# Patient Record
Sex: Female | Born: 1955 | Race: Black or African American | Hispanic: No | Smoking: Never smoker
Health system: Southern US, Community
[De-identification: ages and names within clinical notes are randomized; demographics above are authoritative.]

## PROBLEM LIST (undated history)

## (undated) DIAGNOSIS — I10 Essential (primary) hypertension: Secondary | ICD-10-CM

## (undated) DIAGNOSIS — R079 Chest pain, unspecified: Secondary | ICD-10-CM

## (undated) DIAGNOSIS — E669 Obesity, unspecified: Secondary | ICD-10-CM

## (undated) DIAGNOSIS — K219 Gastro-esophageal reflux disease without esophagitis: Secondary | ICD-10-CM

## (undated) DIAGNOSIS — E785 Hyperlipidemia, unspecified: Secondary | ICD-10-CM

## (undated) DIAGNOSIS — M1990 Unspecified osteoarthritis, unspecified site: Secondary | ICD-10-CM

## (undated) DIAGNOSIS — G4730 Sleep apnea, unspecified: Secondary | ICD-10-CM

## (undated) DIAGNOSIS — G8929 Other chronic pain: Secondary | ICD-10-CM

## (undated) DIAGNOSIS — M25519 Pain in unspecified shoulder: Secondary | ICD-10-CM

## (undated) HISTORY — PX: PARTIAL HYSTERECTOMY: SHX80

## (undated) HISTORY — DX: Obesity, unspecified: E66.9

## (undated) HISTORY — DX: Hyperlipidemia, unspecified: E78.5

---

## 1997-12-13 ENCOUNTER — Emergency Department (HOSPITAL_COMMUNITY): Admission: EM | Admit: 1997-12-13 | Discharge: 1997-12-13 | Payer: Self-pay | Admitting: Emergency Medicine

## 1997-12-20 ENCOUNTER — Emergency Department (HOSPITAL_COMMUNITY): Admission: EM | Admit: 1997-12-20 | Discharge: 1997-12-20 | Payer: Self-pay | Admitting: Emergency Medicine

## 1997-12-29 ENCOUNTER — Emergency Department (HOSPITAL_COMMUNITY): Admission: EM | Admit: 1997-12-29 | Discharge: 1997-12-29 | Payer: Self-pay | Admitting: Emergency Medicine

## 1998-01-21 ENCOUNTER — Emergency Department (HOSPITAL_COMMUNITY): Admission: EM | Admit: 1998-01-21 | Discharge: 1998-01-21 | Payer: Self-pay | Admitting: Emergency Medicine

## 1998-09-10 ENCOUNTER — Emergency Department (HOSPITAL_COMMUNITY): Admission: EM | Admit: 1998-09-10 | Discharge: 1998-09-10 | Payer: Self-pay | Admitting: Emergency Medicine

## 1998-10-20 ENCOUNTER — Encounter: Payer: Self-pay | Admitting: Emergency Medicine

## 1998-10-20 ENCOUNTER — Emergency Department (HOSPITAL_COMMUNITY): Admission: EM | Admit: 1998-10-20 | Discharge: 1998-10-20 | Payer: Self-pay | Admitting: Emergency Medicine

## 1998-10-30 ENCOUNTER — Emergency Department (HOSPITAL_COMMUNITY): Admission: EM | Admit: 1998-10-30 | Discharge: 1998-10-30 | Payer: Self-pay | Admitting: Emergency Medicine

## 1999-05-02 ENCOUNTER — Inpatient Hospital Stay (HOSPITAL_COMMUNITY): Admission: EM | Admit: 1999-05-02 | Discharge: 1999-05-04 | Payer: Self-pay | Admitting: Emergency Medicine

## 1999-05-02 ENCOUNTER — Encounter: Payer: Self-pay | Admitting: Emergency Medicine

## 1999-05-02 ENCOUNTER — Encounter: Payer: Self-pay | Admitting: Internal Medicine

## 1999-05-03 ENCOUNTER — Encounter: Payer: Self-pay | Admitting: Internal Medicine

## 1999-05-21 ENCOUNTER — Encounter: Payer: Self-pay | Admitting: Emergency Medicine

## 1999-05-21 ENCOUNTER — Emergency Department (HOSPITAL_COMMUNITY): Admission: EM | Admit: 1999-05-21 | Discharge: 1999-05-21 | Payer: Self-pay | Admitting: Emergency Medicine

## 1999-06-05 ENCOUNTER — Encounter: Payer: Self-pay | Admitting: Emergency Medicine

## 1999-06-05 ENCOUNTER — Observation Stay (HOSPITAL_COMMUNITY): Admission: EM | Admit: 1999-06-05 | Discharge: 1999-06-06 | Payer: Self-pay | Admitting: Emergency Medicine

## 1999-06-11 ENCOUNTER — Encounter: Admission: RE | Admit: 1999-06-11 | Discharge: 1999-06-11 | Payer: Self-pay | Admitting: Family Medicine

## 1999-06-15 ENCOUNTER — Encounter: Payer: Self-pay | Admitting: Emergency Medicine

## 1999-06-15 ENCOUNTER — Emergency Department (HOSPITAL_COMMUNITY): Admission: EM | Admit: 1999-06-15 | Discharge: 1999-06-15 | Payer: Self-pay | Admitting: Emergency Medicine

## 1999-06-18 ENCOUNTER — Encounter: Payer: Self-pay | Admitting: Emergency Medicine

## 1999-06-18 ENCOUNTER — Emergency Department (HOSPITAL_COMMUNITY): Admission: EM | Admit: 1999-06-18 | Discharge: 1999-06-18 | Payer: Self-pay | Admitting: Emergency Medicine

## 1999-06-25 ENCOUNTER — Encounter: Admission: RE | Admit: 1999-06-25 | Discharge: 1999-06-25 | Payer: Self-pay | Admitting: Family Medicine

## 1999-07-23 ENCOUNTER — Emergency Department (HOSPITAL_COMMUNITY): Admission: EM | Admit: 1999-07-23 | Discharge: 1999-07-23 | Payer: Self-pay | Admitting: *Deleted

## 1999-07-23 ENCOUNTER — Encounter: Payer: Self-pay | Admitting: Emergency Medicine

## 1999-07-28 ENCOUNTER — Emergency Department (HOSPITAL_COMMUNITY): Admission: EM | Admit: 1999-07-28 | Discharge: 1999-07-28 | Payer: Self-pay | Admitting: Emergency Medicine

## 1999-07-28 ENCOUNTER — Encounter: Payer: Self-pay | Admitting: Emergency Medicine

## 1999-10-01 ENCOUNTER — Emergency Department (HOSPITAL_COMMUNITY): Admission: EM | Admit: 1999-10-01 | Discharge: 1999-10-01 | Payer: Self-pay | Admitting: Emergency Medicine

## 1999-10-02 ENCOUNTER — Encounter: Admission: RE | Admit: 1999-10-02 | Discharge: 1999-10-02 | Payer: Self-pay

## 1999-11-03 ENCOUNTER — Encounter: Payer: Self-pay | Admitting: Emergency Medicine

## 1999-11-03 ENCOUNTER — Emergency Department (HOSPITAL_COMMUNITY): Admission: EM | Admit: 1999-11-03 | Discharge: 1999-11-03 | Payer: Self-pay | Admitting: Emergency Medicine

## 1999-11-21 ENCOUNTER — Emergency Department (HOSPITAL_COMMUNITY): Admission: EM | Admit: 1999-11-21 | Discharge: 1999-11-21 | Payer: Self-pay | Admitting: Emergency Medicine

## 1999-12-02 ENCOUNTER — Emergency Department (HOSPITAL_COMMUNITY): Admission: EM | Admit: 1999-12-02 | Discharge: 1999-12-03 | Payer: Self-pay

## 1999-12-17 ENCOUNTER — Ambulatory Visit (HOSPITAL_COMMUNITY): Admission: RE | Admit: 1999-12-17 | Discharge: 1999-12-17 | Payer: Self-pay | Admitting: *Deleted

## 1999-12-29 ENCOUNTER — Encounter: Payer: Self-pay | Admitting: Emergency Medicine

## 1999-12-29 ENCOUNTER — Emergency Department (HOSPITAL_COMMUNITY): Admission: EM | Admit: 1999-12-29 | Discharge: 1999-12-29 | Payer: Self-pay | Admitting: Emergency Medicine

## 2000-01-09 ENCOUNTER — Emergency Department (HOSPITAL_COMMUNITY): Admission: EM | Admit: 2000-01-09 | Discharge: 2000-01-09 | Payer: Self-pay | Admitting: Emergency Medicine

## 2000-01-10 ENCOUNTER — Encounter: Payer: Self-pay | Admitting: Emergency Medicine

## 2000-02-04 ENCOUNTER — Emergency Department (HOSPITAL_COMMUNITY): Admission: EM | Admit: 2000-02-04 | Discharge: 2000-02-04 | Payer: Self-pay | Admitting: Emergency Medicine

## 2000-02-04 ENCOUNTER — Encounter: Payer: Self-pay | Admitting: Emergency Medicine

## 2000-02-10 ENCOUNTER — Emergency Department (HOSPITAL_COMMUNITY): Admission: EM | Admit: 2000-02-10 | Discharge: 2000-02-10 | Payer: Self-pay | Admitting: Emergency Medicine

## 2000-03-30 ENCOUNTER — Emergency Department (HOSPITAL_COMMUNITY): Admission: EM | Admit: 2000-03-30 | Discharge: 2000-03-31 | Payer: Self-pay | Admitting: Emergency Medicine

## 2000-03-31 ENCOUNTER — Encounter: Payer: Self-pay | Admitting: Emergency Medicine

## 2000-04-15 ENCOUNTER — Inpatient Hospital Stay (HOSPITAL_COMMUNITY): Admission: AD | Admit: 2000-04-15 | Discharge: 2000-04-15 | Payer: Self-pay | Admitting: Obstetrics

## 2000-05-11 ENCOUNTER — Inpatient Hospital Stay (HOSPITAL_COMMUNITY): Admission: AD | Admit: 2000-05-11 | Discharge: 2000-05-11 | Payer: Self-pay | Admitting: Obstetrics

## 2000-06-29 ENCOUNTER — Emergency Department (HOSPITAL_COMMUNITY): Admission: EM | Admit: 2000-06-29 | Discharge: 2000-06-29 | Payer: Self-pay | Admitting: Emergency Medicine

## 2000-07-13 ENCOUNTER — Emergency Department (HOSPITAL_COMMUNITY): Admission: EM | Admit: 2000-07-13 | Discharge: 2000-07-13 | Payer: Self-pay | Admitting: Emergency Medicine

## 2000-09-05 ENCOUNTER — Emergency Department (HOSPITAL_COMMUNITY): Admission: EM | Admit: 2000-09-05 | Discharge: 2000-09-05 | Payer: Self-pay | Admitting: Emergency Medicine

## 2000-09-05 ENCOUNTER — Encounter: Payer: Self-pay | Admitting: Emergency Medicine

## 2000-10-10 ENCOUNTER — Encounter: Payer: Self-pay | Admitting: Emergency Medicine

## 2000-10-10 ENCOUNTER — Emergency Department (HOSPITAL_COMMUNITY): Admission: EM | Admit: 2000-10-10 | Discharge: 2000-10-10 | Payer: Self-pay | Admitting: Emergency Medicine

## 2000-12-13 ENCOUNTER — Emergency Department (HOSPITAL_COMMUNITY): Admission: EM | Admit: 2000-12-13 | Discharge: 2000-12-13 | Payer: Self-pay | Admitting: Emergency Medicine

## 2000-12-13 ENCOUNTER — Encounter: Payer: Self-pay | Admitting: Emergency Medicine

## 2001-02-06 ENCOUNTER — Emergency Department (HOSPITAL_COMMUNITY): Admission: EM | Admit: 2001-02-06 | Discharge: 2001-02-07 | Payer: Self-pay | Admitting: Emergency Medicine

## 2001-03-18 ENCOUNTER — Inpatient Hospital Stay (HOSPITAL_COMMUNITY): Admission: AD | Admit: 2001-03-18 | Discharge: 2001-03-18 | Payer: Self-pay | Admitting: Obstetrics & Gynecology

## 2001-03-18 ENCOUNTER — Encounter: Payer: Self-pay | Admitting: Obstetrics & Gynecology

## 2001-04-24 ENCOUNTER — Inpatient Hospital Stay (HOSPITAL_COMMUNITY): Admission: AD | Admit: 2001-04-24 | Discharge: 2001-04-24 | Payer: Self-pay | Admitting: Obstetrics

## 2001-05-26 ENCOUNTER — Emergency Department (HOSPITAL_COMMUNITY): Admission: EM | Admit: 2001-05-26 | Discharge: 2001-05-26 | Payer: Self-pay | Admitting: Emergency Medicine

## 2001-05-26 ENCOUNTER — Encounter: Payer: Self-pay | Admitting: Emergency Medicine

## 2001-08-22 ENCOUNTER — Emergency Department (HOSPITAL_COMMUNITY): Admission: EM | Admit: 2001-08-22 | Discharge: 2001-08-22 | Payer: Self-pay | Admitting: Emergency Medicine

## 2001-10-09 ENCOUNTER — Emergency Department (HOSPITAL_COMMUNITY): Admission: EM | Admit: 2001-10-09 | Discharge: 2001-10-09 | Payer: Self-pay

## 2001-10-25 ENCOUNTER — Encounter: Payer: Self-pay | Admitting: Emergency Medicine

## 2001-10-25 ENCOUNTER — Emergency Department (HOSPITAL_COMMUNITY): Admission: EM | Admit: 2001-10-25 | Discharge: 2001-10-25 | Payer: Self-pay | Admitting: Emergency Medicine

## 2001-11-27 ENCOUNTER — Emergency Department (HOSPITAL_COMMUNITY): Admission: EM | Admit: 2001-11-27 | Discharge: 2001-11-27 | Payer: Self-pay | Admitting: Emergency Medicine

## 2002-02-01 ENCOUNTER — Encounter: Payer: Self-pay | Admitting: Emergency Medicine

## 2002-02-01 ENCOUNTER — Emergency Department (HOSPITAL_COMMUNITY): Admission: EM | Admit: 2002-02-01 | Discharge: 2002-02-02 | Payer: Self-pay

## 2002-04-14 ENCOUNTER — Emergency Department (HOSPITAL_COMMUNITY): Admission: EM | Admit: 2002-04-14 | Discharge: 2002-04-14 | Payer: Self-pay | Admitting: *Deleted

## 2002-04-17 ENCOUNTER — Emergency Department (HOSPITAL_COMMUNITY): Admission: EM | Admit: 2002-04-17 | Discharge: 2002-04-17 | Payer: Self-pay

## 2002-05-05 ENCOUNTER — Emergency Department (HOSPITAL_COMMUNITY): Admission: EM | Admit: 2002-05-05 | Discharge: 2002-05-05 | Payer: Self-pay | Admitting: Emergency Medicine

## 2002-05-29 ENCOUNTER — Emergency Department (HOSPITAL_COMMUNITY): Admission: EM | Admit: 2002-05-29 | Discharge: 2002-05-29 | Payer: Self-pay | Admitting: Emergency Medicine

## 2002-05-31 ENCOUNTER — Emergency Department (HOSPITAL_COMMUNITY): Admission: EM | Admit: 2002-05-31 | Discharge: 2002-05-31 | Payer: Self-pay | Admitting: Emergency Medicine

## 2002-05-31 ENCOUNTER — Encounter: Payer: Self-pay | Admitting: Emergency Medicine

## 2002-06-25 ENCOUNTER — Emergency Department (HOSPITAL_COMMUNITY): Admission: EM | Admit: 2002-06-25 | Discharge: 2002-06-25 | Payer: Self-pay | Admitting: Emergency Medicine

## 2002-06-25 ENCOUNTER — Encounter: Payer: Self-pay | Admitting: Emergency Medicine

## 2002-07-17 ENCOUNTER — Emergency Department (HOSPITAL_COMMUNITY): Admission: EM | Admit: 2002-07-17 | Discharge: 2002-07-17 | Payer: Self-pay | Admitting: Emergency Medicine

## 2002-10-27 ENCOUNTER — Encounter: Payer: Self-pay | Admitting: Emergency Medicine

## 2002-10-27 ENCOUNTER — Emergency Department (HOSPITAL_COMMUNITY): Admission: EM | Admit: 2002-10-27 | Discharge: 2002-10-27 | Payer: Self-pay | Admitting: Emergency Medicine

## 2003-01-19 ENCOUNTER — Emergency Department (HOSPITAL_COMMUNITY): Admission: EM | Admit: 2003-01-19 | Discharge: 2003-01-19 | Payer: Self-pay | Admitting: *Deleted

## 2003-01-19 ENCOUNTER — Emergency Department (HOSPITAL_COMMUNITY): Admission: EM | Admit: 2003-01-19 | Discharge: 2003-01-19 | Payer: Self-pay

## 2003-01-19 ENCOUNTER — Encounter: Payer: Self-pay | Admitting: *Deleted

## 2003-03-12 ENCOUNTER — Encounter: Payer: Self-pay | Admitting: Emergency Medicine

## 2003-03-12 ENCOUNTER — Emergency Department (HOSPITAL_COMMUNITY): Admission: EM | Admit: 2003-03-12 | Discharge: 2003-03-12 | Payer: Self-pay | Admitting: Emergency Medicine

## 2003-03-21 ENCOUNTER — Encounter: Payer: Self-pay | Admitting: *Deleted

## 2003-03-21 ENCOUNTER — Inpatient Hospital Stay (HOSPITAL_COMMUNITY): Admission: AD | Admit: 2003-03-21 | Discharge: 2003-03-21 | Payer: Self-pay | Admitting: *Deleted

## 2003-04-05 ENCOUNTER — Encounter: Payer: Self-pay | Admitting: Obstetrics

## 2003-04-05 ENCOUNTER — Ambulatory Visit (HOSPITAL_COMMUNITY): Admission: RE | Admit: 2003-04-05 | Discharge: 2003-04-05 | Payer: Self-pay | Admitting: Obstetrics

## 2003-04-17 ENCOUNTER — Emergency Department (HOSPITAL_COMMUNITY): Admission: EM | Admit: 2003-04-17 | Discharge: 2003-04-18 | Payer: Self-pay | Admitting: Emergency Medicine

## 2003-04-17 ENCOUNTER — Encounter: Payer: Self-pay | Admitting: Emergency Medicine

## 2003-05-03 ENCOUNTER — Encounter (INDEPENDENT_AMBULATORY_CARE_PROVIDER_SITE_OTHER): Payer: Self-pay

## 2003-05-03 ENCOUNTER — Inpatient Hospital Stay (HOSPITAL_COMMUNITY): Admission: RE | Admit: 2003-05-03 | Discharge: 2003-05-06 | Payer: Self-pay | Admitting: Obstetrics

## 2003-06-21 ENCOUNTER — Inpatient Hospital Stay (HOSPITAL_COMMUNITY): Admission: AD | Admit: 2003-06-21 | Discharge: 2003-06-21 | Payer: Self-pay | Admitting: Obstetrics

## 2003-07-17 ENCOUNTER — Emergency Department (HOSPITAL_COMMUNITY): Admission: EM | Admit: 2003-07-17 | Discharge: 2003-07-18 | Payer: Self-pay | Admitting: Emergency Medicine

## 2003-07-31 ENCOUNTER — Inpatient Hospital Stay (HOSPITAL_COMMUNITY): Admission: AD | Admit: 2003-07-31 | Discharge: 2003-07-31 | Payer: Self-pay | Admitting: Obstetrics & Gynecology

## 2003-10-15 ENCOUNTER — Emergency Department (HOSPITAL_COMMUNITY): Admission: EM | Admit: 2003-10-15 | Discharge: 2003-10-16 | Payer: Self-pay | Admitting: Emergency Medicine

## 2003-11-03 ENCOUNTER — Emergency Department (HOSPITAL_COMMUNITY): Admission: EM | Admit: 2003-11-03 | Discharge: 2003-11-04 | Payer: Self-pay | Admitting: Emergency Medicine

## 2004-02-13 ENCOUNTER — Emergency Department (HOSPITAL_COMMUNITY): Admission: EM | Admit: 2004-02-13 | Discharge: 2004-02-14 | Payer: Self-pay | Admitting: Emergency Medicine

## 2004-05-13 ENCOUNTER — Emergency Department (HOSPITAL_COMMUNITY): Admission: EM | Admit: 2004-05-13 | Discharge: 2004-05-13 | Payer: Self-pay | Admitting: Emergency Medicine

## 2004-08-01 ENCOUNTER — Emergency Department (HOSPITAL_COMMUNITY): Admission: EM | Admit: 2004-08-01 | Discharge: 2004-08-01 | Payer: Self-pay | Admitting: Emergency Medicine

## 2004-11-12 ENCOUNTER — Emergency Department (HOSPITAL_COMMUNITY): Admission: EM | Admit: 2004-11-12 | Discharge: 2004-11-13 | Payer: Self-pay | Admitting: *Deleted

## 2004-11-23 ENCOUNTER — Inpatient Hospital Stay (HOSPITAL_COMMUNITY): Admission: AD | Admit: 2004-11-23 | Discharge: 2004-11-23 | Payer: Self-pay | Admitting: Obstetrics

## 2005-05-15 ENCOUNTER — Emergency Department (HOSPITAL_COMMUNITY): Admission: EM | Admit: 2005-05-15 | Discharge: 2005-05-15 | Payer: Self-pay | Admitting: *Deleted

## 2005-07-25 ENCOUNTER — Emergency Department (HOSPITAL_COMMUNITY): Admission: EM | Admit: 2005-07-25 | Discharge: 2005-07-25 | Payer: Self-pay | Admitting: Emergency Medicine

## 2005-08-22 ENCOUNTER — Ambulatory Visit: Payer: Self-pay | Admitting: Internal Medicine

## 2005-08-22 ENCOUNTER — Inpatient Hospital Stay (HOSPITAL_COMMUNITY): Admission: EM | Admit: 2005-08-22 | Discharge: 2005-08-26 | Payer: Self-pay | Admitting: Emergency Medicine

## 2005-08-25 ENCOUNTER — Encounter: Payer: Self-pay | Admitting: Cardiology

## 2005-12-30 ENCOUNTER — Inpatient Hospital Stay (HOSPITAL_COMMUNITY): Admission: AD | Admit: 2005-12-30 | Discharge: 2005-12-30 | Payer: Self-pay | Admitting: Obstetrics and Gynecology

## 2006-05-16 ENCOUNTER — Emergency Department (HOSPITAL_COMMUNITY): Admission: EM | Admit: 2006-05-16 | Discharge: 2006-05-16 | Payer: Self-pay | Admitting: Emergency Medicine

## 2006-07-29 ENCOUNTER — Emergency Department (HOSPITAL_COMMUNITY): Admission: EM | Admit: 2006-07-29 | Discharge: 2006-07-29 | Payer: Self-pay | Admitting: Emergency Medicine

## 2006-08-23 ENCOUNTER — Emergency Department (HOSPITAL_COMMUNITY): Admission: EM | Admit: 2006-08-23 | Discharge: 2006-08-23 | Payer: Self-pay | Admitting: Emergency Medicine

## 2007-01-16 ENCOUNTER — Ambulatory Visit: Payer: Self-pay | Admitting: Internal Medicine

## 2007-01-16 ENCOUNTER — Ambulatory Visit: Payer: Self-pay | Admitting: Cardiology

## 2007-01-16 ENCOUNTER — Inpatient Hospital Stay (HOSPITAL_COMMUNITY): Admission: EM | Admit: 2007-01-16 | Discharge: 2007-01-18 | Payer: Self-pay | Admitting: Emergency Medicine

## 2007-01-17 ENCOUNTER — Encounter (INDEPENDENT_AMBULATORY_CARE_PROVIDER_SITE_OTHER): Payer: Self-pay | Admitting: Internal Medicine

## 2007-03-02 ENCOUNTER — Inpatient Hospital Stay (HOSPITAL_COMMUNITY): Admission: AD | Admit: 2007-03-02 | Discharge: 2007-03-02 | Payer: Self-pay | Admitting: Family Medicine

## 2007-05-28 ENCOUNTER — Emergency Department (HOSPITAL_COMMUNITY): Admission: EM | Admit: 2007-05-28 | Discharge: 2007-05-29 | Payer: Self-pay | Admitting: Emergency Medicine

## 2007-05-31 ENCOUNTER — Emergency Department (HOSPITAL_COMMUNITY): Admission: EM | Admit: 2007-05-31 | Discharge: 2007-05-31 | Payer: Self-pay | Admitting: Emergency Medicine

## 2007-07-01 ENCOUNTER — Ambulatory Visit: Payer: Self-pay | Admitting: Internal Medicine

## 2007-07-01 DIAGNOSIS — M549 Dorsalgia, unspecified: Secondary | ICD-10-CM | POA: Insufficient documentation

## 2007-07-01 DIAGNOSIS — K219 Gastro-esophageal reflux disease without esophagitis: Secondary | ICD-10-CM

## 2007-07-01 DIAGNOSIS — E785 Hyperlipidemia, unspecified: Secondary | ICD-10-CM

## 2007-09-03 ENCOUNTER — Encounter (INDEPENDENT_AMBULATORY_CARE_PROVIDER_SITE_OTHER): Payer: Self-pay | Admitting: Emergency Medicine

## 2007-09-03 ENCOUNTER — Ambulatory Visit: Payer: Self-pay | Admitting: Surgery

## 2007-09-03 ENCOUNTER — Ambulatory Visit: Admission: RE | Admit: 2007-09-03 | Discharge: 2007-09-03 | Payer: Self-pay | Admitting: Emergency Medicine

## 2007-10-05 ENCOUNTER — Emergency Department (HOSPITAL_COMMUNITY): Admission: EM | Admit: 2007-10-05 | Discharge: 2007-10-05 | Payer: Self-pay | Admitting: Emergency Medicine

## 2007-10-11 ENCOUNTER — Emergency Department (HOSPITAL_COMMUNITY): Admission: EM | Admit: 2007-10-11 | Discharge: 2007-10-11 | Payer: Self-pay | Admitting: Emergency Medicine

## 2008-02-28 ENCOUNTER — Emergency Department (HOSPITAL_COMMUNITY): Admission: EM | Admit: 2008-02-28 | Discharge: 2008-02-28 | Payer: Self-pay | Admitting: Emergency Medicine

## 2008-04-26 ENCOUNTER — Emergency Department (HOSPITAL_COMMUNITY): Admission: EM | Admit: 2008-04-26 | Discharge: 2008-04-27 | Payer: Self-pay | Admitting: Emergency Medicine

## 2008-06-19 ENCOUNTER — Emergency Department (HOSPITAL_COMMUNITY): Admission: EM | Admit: 2008-06-19 | Discharge: 2008-06-19 | Payer: Self-pay | Admitting: Emergency Medicine

## 2008-08-18 ENCOUNTER — Emergency Department (HOSPITAL_COMMUNITY): Admission: EM | Admit: 2008-08-18 | Discharge: 2008-08-18 | Payer: Self-pay | Admitting: Emergency Medicine

## 2008-08-20 ENCOUNTER — Ambulatory Visit (HOSPITAL_COMMUNITY): Admission: RE | Admit: 2008-08-20 | Discharge: 2008-08-20 | Payer: Self-pay | Admitting: Emergency Medicine

## 2008-11-29 ENCOUNTER — Emergency Department (HOSPITAL_COMMUNITY): Admission: EM | Admit: 2008-11-29 | Discharge: 2008-11-29 | Payer: Self-pay | Admitting: Emergency Medicine

## 2009-02-03 ENCOUNTER — Emergency Department (HOSPITAL_COMMUNITY): Admission: EM | Admit: 2009-02-03 | Discharge: 2009-02-03 | Payer: Self-pay | Admitting: Emergency Medicine

## 2009-02-13 ENCOUNTER — Encounter: Admission: RE | Admit: 2009-02-13 | Discharge: 2009-02-13 | Payer: Self-pay | Admitting: Orthopedic Surgery

## 2009-02-21 ENCOUNTER — Encounter: Admission: RE | Admit: 2009-02-21 | Discharge: 2009-02-21 | Payer: Self-pay | Admitting: Orthopedic Surgery

## 2009-03-08 ENCOUNTER — Encounter: Admission: RE | Admit: 2009-03-08 | Discharge: 2009-03-08 | Payer: Self-pay | Admitting: Orthopedic Surgery

## 2009-05-30 ENCOUNTER — Emergency Department (HOSPITAL_COMMUNITY): Admission: EM | Admit: 2009-05-30 | Discharge: 2009-05-30 | Payer: Self-pay | Admitting: Emergency Medicine

## 2009-06-13 ENCOUNTER — Emergency Department (HOSPITAL_COMMUNITY): Admission: EM | Admit: 2009-06-13 | Discharge: 2009-06-14 | Payer: Self-pay | Admitting: Emergency Medicine

## 2009-06-14 ENCOUNTER — Encounter: Payer: Self-pay | Admitting: Cardiology

## 2009-11-04 ENCOUNTER — Emergency Department (HOSPITAL_COMMUNITY): Admission: EM | Admit: 2009-11-04 | Discharge: 2009-11-04 | Payer: Self-pay | Admitting: Emergency Medicine

## 2010-02-03 ENCOUNTER — Emergency Department (HOSPITAL_COMMUNITY): Admission: EM | Admit: 2010-02-03 | Discharge: 2010-02-03 | Payer: Self-pay | Admitting: Emergency Medicine

## 2010-02-12 ENCOUNTER — Observation Stay (HOSPITAL_COMMUNITY): Admission: EM | Admit: 2010-02-12 | Discharge: 2010-02-13 | Payer: Self-pay | Admitting: Emergency Medicine

## 2010-02-12 ENCOUNTER — Ambulatory Visit: Payer: Self-pay | Admitting: Cardiology

## 2010-05-01 ENCOUNTER — Emergency Department (HOSPITAL_COMMUNITY): Admission: EM | Admit: 2010-05-01 | Discharge: 2010-05-01 | Payer: Self-pay | Admitting: Emergency Medicine

## 2010-06-22 ENCOUNTER — Emergency Department (HOSPITAL_COMMUNITY)
Admission: EM | Admit: 2010-06-22 | Discharge: 2010-06-22 | Payer: Self-pay | Source: Home / Self Care | Admitting: Emergency Medicine

## 2010-06-30 ENCOUNTER — Emergency Department (HOSPITAL_COMMUNITY)
Admission: EM | Admit: 2010-06-30 | Discharge: 2010-06-30 | Payer: Self-pay | Source: Home / Self Care | Admitting: Emergency Medicine

## 2010-07-13 ENCOUNTER — Emergency Department (HOSPITAL_COMMUNITY)
Admission: EM | Admit: 2010-07-13 | Discharge: 2010-07-13 | Payer: Self-pay | Source: Home / Self Care | Admitting: Emergency Medicine

## 2010-08-27 ENCOUNTER — Emergency Department (HOSPITAL_COMMUNITY)
Admission: EM | Admit: 2010-08-27 | Discharge: 2010-08-27 | Disposition: A | Discharge status: Home / Self Care | Payer: Self-pay | Attending: Emergency Medicine | Admitting: Emergency Medicine

## 2010-08-27 DIAGNOSIS — G8929 Other chronic pain: Secondary | ICD-10-CM | POA: Insufficient documentation

## 2010-08-27 DIAGNOSIS — M549 Dorsalgia, unspecified: Secondary | ICD-10-CM | POA: Insufficient documentation

## 2010-09-06 LAB — POCT CARDIAC MARKERS
CKMB, poc: <1 ng/mL — ABNORMAL LOW (ref 1.0–8.0)
Myoglobin, poc: 39 ng/mL (ref 12–200)
Myoglobin, poc: 40.4 ng/mL (ref 12–200)
Myoglobin, poc: 47.5 ng/mL (ref 12–200)
Myoglobin, poc: 47.7 ng/mL (ref 12–200)
Troponin i, poc: <0.05 ng/mL (ref 0.00–0.09)
Troponin i, poc: <0.05 ng/mL (ref 0.00–0.09)

## 2010-09-06 LAB — BASIC METABOLIC PANEL
GFR calc Af Amer: >60 mL/min (ref >60)
GFR calc non Af Amer: >60 mL/min (ref >60)
Potassium: 3.8 mEq/L (ref 3.5–5.1)
Sodium: 131 mEq/L — ABNORMAL LOW (ref 135–145)

## 2010-09-06 LAB — CBC
HCT: 41.6 % (ref 36.0–46.0)
Hemoglobin: 13.7 g/dL (ref 12.0–15.0)
WBC: 12.7 10*3/uL — ABNORMAL HIGH (ref 4.0–10.5)

## 2010-09-06 LAB — DIFFERENTIAL
Basophils Absolute: 0 10*3/uL (ref 0.0–0.1)
Lymphocytes Relative: 33 % (ref 12–46)
Monocytes Absolute: 0.8 10*3/uL (ref 0.1–1.0)
Monocytes Relative: 7 % (ref 3–12)
Neutro Abs: 7.4 10*3/uL (ref 1.7–7.7)

## 2010-09-09 LAB — POCT I-STAT, CHEM 8
HCT: 43 % (ref 36.0–46.0)
Hemoglobin: 14.6 g/dL (ref 12.0–15.0)
Potassium: 3.9 mEq/L (ref 3.5–5.1)
Sodium: 142 mEq/L (ref 135–145)
TCO2: 26 mmol/L (ref 0–100)

## 2010-09-09 LAB — POCT CARDIAC MARKERS
CKMB, poc: <1 ng/mL — ABNORMAL LOW (ref 1.0–8.0)
Myoglobin, poc: 46.8 ng/mL (ref 12–200)

## 2010-09-09 LAB — CBC
MCV: 83.5 fL (ref 78.0–100.0)
Platelets: 269 10*3/uL (ref 150–400)
RDW: 15.9 % — ABNORMAL HIGH (ref 11.5–15.5)
WBC: 8.9 10*3/uL (ref 4.0–10.5)

## 2010-09-09 LAB — DIFFERENTIAL
Basophils Absolute: 0 10*3/uL (ref 0.0–0.1)
Basophils Relative: 0 % (ref 0–1)
Eosinophils Absolute: 0.2 10*3/uL (ref 0.0–0.7)
Eosinophils Relative: 2 % (ref 0–5)
Lymphs Abs: 3.8 10*3/uL (ref 0.7–4.0)
Neutrophils Relative %: 49 % (ref 43–77)

## 2010-09-16 ENCOUNTER — Emergency Department (HOSPITAL_COMMUNITY): Payer: Self-pay

## 2010-09-16 ENCOUNTER — Emergency Department (HOSPITAL_COMMUNITY)
Admission: EM | Admit: 2010-09-16 | Discharge: 2010-09-16 | Disposition: A | Discharge status: Home / Self Care | Payer: Self-pay | Attending: Emergency Medicine | Admitting: Emergency Medicine

## 2010-09-16 DIAGNOSIS — R1013 Epigastric pain: Secondary | ICD-10-CM | POA: Insufficient documentation

## 2010-09-16 DIAGNOSIS — R0602 Shortness of breath: Secondary | ICD-10-CM | POA: Insufficient documentation

## 2010-09-16 DIAGNOSIS — R109 Unspecified abdominal pain: Secondary | ICD-10-CM | POA: Insufficient documentation

## 2010-09-16 DIAGNOSIS — IMO0002 Reserved for concepts with insufficient information to code with codable children: Secondary | ICD-10-CM | POA: Insufficient documentation

## 2010-09-16 DIAGNOSIS — M546 Pain in thoracic spine: Secondary | ICD-10-CM | POA: Insufficient documentation

## 2010-09-16 DIAGNOSIS — M549 Dorsalgia, unspecified: Secondary | ICD-10-CM | POA: Insufficient documentation

## 2010-09-16 LAB — COMPREHENSIVE METABOLIC PANEL
ALT: 18 U/L (ref 0–35)
AST: 19 U/L (ref 0–37)
CO2: 28 mEq/L (ref 19–32)
Calcium: 9.8 mg/dL (ref 8.4–10.5)
Chloride: 104 mEq/L (ref 96–112)
Creatinine, Ser: 0.61 mg/dL (ref 0.4–1.2)
GFR calc Af Amer: >60 mL/min (ref >60)
GFR calc non Af Amer: >60 mL/min (ref >60)
Glucose, Bld: 120 mg/dL — ABNORMAL HIGH (ref 70–99)
Total Bilirubin: 0.5 mg/dL (ref 0.3–1.2)

## 2010-09-16 LAB — URINALYSIS, ROUTINE W REFLEX MICROSCOPIC
Bilirubin Urine: NEGATIVE
Glucose, UA: NEGATIVE mg/dL
Hgb urine dipstick: NEGATIVE
Protein, ur: NEGATIVE mg/dL
Urobilinogen, UA: 0.2 mg/dL (ref 0.0–1.0)

## 2010-09-23 LAB — POCT CARDIAC MARKERS: Myoglobin, poc: 72 ng/mL (ref 12–200)

## 2010-09-23 LAB — TROPONIN I
Troponin I: 0.02 ng/mL (ref 0.00–0.06)
Troponin I: 0.02 ng/mL (ref 0.00–0.06)

## 2010-09-23 LAB — CK TOTAL AND CKMB (NOT AT ARMC)
CK, MB: 0.7 ng/mL (ref 0.3–4.0)
CK, MB: 0.7 ng/mL (ref 0.3–4.0)
Total CK: 73 U/L (ref 7–177)

## 2010-09-23 LAB — DIFFERENTIAL
Lymphocytes Relative: 32 % (ref 12–46)
Lymphs Abs: 3.2 10*3/uL (ref 0.7–4.0)
Monocytes Relative: 5 % (ref 3–12)
Neutrophils Relative %: 62 % (ref 43–77)

## 2010-09-23 LAB — URINALYSIS, ROUTINE W REFLEX MICROSCOPIC
Bilirubin Urine: NEGATIVE
Glucose, UA: NEGATIVE mg/dL
Hgb urine dipstick: NEGATIVE
Ketones, ur: NEGATIVE mg/dL
Protein, ur: NEGATIVE mg/dL
pH: 6 (ref 5.0–8.0)

## 2010-09-23 LAB — CBC
Platelets: 287 10*3/uL (ref 150–400)
RBC: 4.95 MIL/uL (ref 3.87–5.11)
WBC: 10.1 10*3/uL (ref 4.0–10.5)

## 2010-09-23 LAB — PROTIME-INR
INR: 0.98 (ref 0.00–1.49)
Prothrombin Time: 12.9 seconds (ref 11.6–15.2)

## 2010-09-23 LAB — BASIC METABOLIC PANEL
BUN: 10 mg/dL (ref 6–23)
Chloride: 101 mEq/L (ref 96–112)
Creatinine, Ser: 0.62 mg/dL (ref 0.4–1.2)
GFR calc Af Amer: >60 mL/min (ref >60)
GFR calc non Af Amer: >60 mL/min (ref >60)
Potassium: 3.9 mEq/L (ref 3.5–5.1)

## 2010-09-23 LAB — BRAIN NATRIURETIC PEPTIDE: Pro B Natriuretic peptide (BNP): <30 pg/mL (ref 0.0–100.0)

## 2010-09-23 LAB — APTT: aPTT: 30 seconds (ref 24–37)

## 2010-09-28 LAB — URINALYSIS, ROUTINE W REFLEX MICROSCOPIC
Bilirubin Urine: NEGATIVE
Hgb urine dipstick: NEGATIVE
Ketones, ur: NEGATIVE mg/dL
Nitrite: NEGATIVE
Protein, ur: NEGATIVE mg/dL
Urobilinogen, UA: 0.2 mg/dL (ref 0.0–1.0)

## 2010-09-30 LAB — COMPREHENSIVE METABOLIC PANEL
ALT: 33 U/L (ref 0–35)
BUN: 8 mg/dL (ref 6–23)
CO2: 27 mEq/L (ref 19–32)
Calcium: 9.6 mg/dL (ref 8.4–10.5)
Creatinine, Ser: 0.61 mg/dL (ref 0.4–1.2)
GFR calc non Af Amer: >60 mL/min (ref >60)
Glucose, Bld: 88 mg/dL (ref 70–99)
Sodium: 139 mEq/L (ref 135–145)

## 2010-09-30 LAB — URINALYSIS, ROUTINE W REFLEX MICROSCOPIC
Bilirubin Urine: NEGATIVE
Hgb urine dipstick: NEGATIVE
Protein, ur: NEGATIVE mg/dL
Urobilinogen, UA: 1 mg/dL (ref 0.0–1.0)

## 2010-09-30 LAB — CK TOTAL AND CKMB (NOT AT ARMC): Total CK: 113 U/L (ref 7–177)

## 2010-09-30 LAB — D-DIMER, QUANTITATIVE: D-Dimer, Quant: 0.31 ug/mL-FEU (ref 0.00–0.48)

## 2010-09-30 LAB — CBC
MCHC: 33.5 g/dL (ref 30.0–36.0)
MCV: 84.5 fL (ref 78.0–100.0)
Platelets: 289 10*3/uL (ref 150–400)
RDW: 15.1 % (ref 11.5–15.5)

## 2010-09-30 LAB — DIFFERENTIAL
Eosinophils Absolute: 0.2 10*3/uL (ref 0.0–0.7)
Lymphs Abs: 3.2 10*3/uL (ref 0.7–4.0)
Neutro Abs: 7 10*3/uL (ref 1.7–7.7)
Neutrophils Relative %: 63 % (ref 43–77)

## 2010-09-30 LAB — LIPASE, BLOOD: Lipase: 16 U/L (ref 11–59)

## 2010-10-08 LAB — DIFFERENTIAL
Basophils Absolute: 0 10*3/uL (ref 0.0–0.1)
Basophils Relative: 0 % (ref 0–1)
Eosinophils Absolute: 0.1 10*3/uL (ref 0.0–0.7)
Eosinophils Relative: 1 % (ref 0–5)
Lymphocytes Relative: 28 % (ref 12–46)
Lymphs Abs: 2.6 10*3/uL (ref 0.7–4.0)
Monocytes Absolute: 0.6 10*3/uL (ref 0.1–1.0)
Monocytes Relative: 6 % (ref 3–12)
Neutro Abs: 6.2 10*3/uL (ref 1.7–7.7)
Neutrophils Relative %: 65 % (ref 43–77)

## 2010-10-08 LAB — COMPREHENSIVE METABOLIC PANEL WITH GFR
ALT: 36 U/L — ABNORMAL HIGH (ref 0–35)
Alkaline Phosphatase: 87 U/L (ref 39–117)
BUN: 10 mg/dL (ref 6–23)
CO2: 26 meq/L (ref 19–32)
Chloride: 105 meq/L (ref 96–112)
GFR calc non Af Amer: >60 mL/min (ref >60)
Glucose, Bld: 96 mg/dL (ref 70–99)
Potassium: 3.6 meq/L (ref 3.5–5.1)
Sodium: 137 meq/L (ref 135–145)
Total Bilirubin: <0.1 mg/dL — ABNORMAL LOW (ref 0.3–1.2)

## 2010-10-08 LAB — COMPREHENSIVE METABOLIC PANEL
AST: 30 U/L (ref 0–37)
Albumin: 3.5 g/dL (ref 3.5–5.2)
Calcium: 9.3 mg/dL (ref 8.4–10.5)
Creatinine, Ser: 0.52 mg/dL (ref 0.4–1.2)
GFR calc Af Amer: >60 mL/min (ref >60)
Total Protein: 7 g/dL (ref 6.0–8.3)

## 2010-10-08 LAB — CBC
HCT: 39 % (ref 36.0–46.0)
Hemoglobin: 13.3 g/dL (ref 12.0–15.0)
MCHC: 34.1 g/dL (ref 30.0–36.0)
MCV: 82.3 fL (ref 78.0–100.0)
Platelets: 284 10*3/uL (ref 150–400)
RBC: 4.74 MIL/uL (ref 3.87–5.11)
RDW: 16.1 % — ABNORMAL HIGH (ref 11.5–15.5)
WBC: 9.5 10*3/uL (ref 4.0–10.5)

## 2010-10-08 LAB — D-DIMER, QUANTITATIVE

## 2010-10-08 LAB — POCT CARDIAC MARKERS
CKMB, poc: <1 ng/mL — ABNORMAL LOW (ref 1.0–8.0)
Myoglobin, poc: 47.7 ng/mL (ref 12–200)
Troponin i, poc: <0.05 ng/mL (ref 0.00–0.09)

## 2010-11-05 NOTE — Discharge Summary (Signed)
NAMEGRACY, EHLY             ACCOUNT NO.:  [id number]   MEDICAL RECORD NO.:  [id number]          PATIENT TYPE:  INP   LOCATION:  3711                         FACILITY:  [facility]   PHYSICIAN:  Alvester Morin, M.D.  DATE OF BIRTH:  17-Oct-1955   DATE OF ADMISSION:  01/16/2007  DATE OF DISCHARGE:  01/18/2007                               DISCHARGE SUMMARY   DISCHARGE DIAGNOSES:  1. Chest pain, acute coronary syndrome ruled out.  2. Gastroesophageal reflux disease.  3. Obesity.  4. Hyperlipidemia.   DISCHARGE MEDICATIONS:  Prilosec 20 mg p.o. b.i.d.   DISPOSITION AND FOLLOWUP:  The patient will follow up with Dr. Kerby Nora  in the outpatient clinic on August 6 at 3:00 p.m.  She is new to our  clinic.  At the time of followup, she will need to be evaluated for the  chest pain.  She will also need to be evaluated to see if the increased  dose of Prilosec has helped with her GERD symptoms as well as her chest  pain.  She will need a fasting lipid panel rechecked in about eight to  ten weeks' time to see if she has made any progress with lifestyle  changes.   DIAGNOSTIC STUDIES:  A 2-D echo on 7/08 shows an EF of 60 % and no LV  wall motion abnormalities   CONSULTATIONS:  Dr. Dietrich Pates, [facility]   ADMITTING HISTORY AND PHYSICAL:  Ms. Morlock is a 55 year old female  with a past medical history significant for obesity, a past history of  polysubstance abuse, presented with chest pain for two days.  Chest pain  was described as episodic lasting for about three or four minutes and  associated with sweating.  Denies cough, fever.  Was mildly relieved  with nitroglycerin.   ADMISSION VITALS:  Blood pressure 113/87, heart rate 77, temperature  98.5, respiratory rate 18.  GENERAL:  Alert, awake and oriented, not in acute distress.  HEENT:  Pupils are equal and reactive.  Extraocular movements are  intact.  CVS:  S1 and S2 present.  Regular rate and rhythm.  RESPIRATORY:   Clear to auscultation bilaterally.  ABDOMEN:  Bowel sounds present.  Nontender, nondistended.  CNS:  Nonfocal.   ADMISSION LABS:  Hemoglobin was 12.2, WBC 9.2, platelets 286.  Basic  metabolic panel:  Sodium 140, potassium 3.9, chloride 109, bicarb 27,  BUN 12, creatinine 0.81 and a glucose of 107.  She has a GFR of 1 and  60.  Liver function panel showed a bilirubin of 0.6, alkaline  phosphatase of 79, SGOT of 25, SGPT of 33, protein of 7.4 and albumin of  3.3.  Her cardiac enzymes were as follows:  First set CK-MB 83, troponin  0.01 and CK-MB 1.2; second set CK 83, CK-MB 0.9 and troponin of 0.01;  and third set CK 78, troponin of 0.1 and a CK-MB of 0.8.   Her EKG showed normal sinus rhythm, incomplete right bundle-branch block  and slight ST depression in 2, 3 and aVF.   Her chest x-ray was negative.   PROBLEM:  1. Chest pain.  Patient presented with acute onset of chest pain.  Her      initial set of cardiac enzymes were negative.  Her EKG showed an      incomplete right bundle-branch block and slight ST depression in 2,      3 and aVF, but showed no ST elevations.  Her D-dimer was negative.      She was seen in consultation by Dr. Dietrich Pates from [facility]      Cardiology.  In light of negative Cardiolite in March of 2007 and a      history of a negative cath in 2000, she was thought to be low risk.      Her three sets of cardiac enzymes were negative.  The inferior EKG      changes were thought to be chronic and not significantly different      from previous EKGs.  Dr. Dietrich Pates advised that no further testing      was required.  On the day of discharge, the chest pain had      improved.  Patient is being discharged to be followed up in the      outpatient clinic.  2. Gastroesophageal reflux disease.  Patient has had a history of      gastroesophageal reflux disease in the past and takes Prilosec 20      mg daily.  This chest pain may have been associated with      gastroesophageal  reflux disease.  She has been asked to increase      her Prilosec to 20 mg b.i.d.  3. Obesity.  Patient has been counseled on weight loss and dietary      changes.  4. Hyperlipidemia.  Her fasting lipid panel on the same day showed two      different results, one showed an LDL of 105 and another showed an      LDL of 123; her HDL is 60.  She has been asked to reduce her      weight, exercise, and her repeat LDL which will need to be done in      about eight to ten weeks' time will probably direct [place] as to      whether or not she needs any treatment.   At the time of discharge, labs and vital signs were as follows:   Her blood pressure was 112/80, pulse was 69, respiratory rate was 20, O2  sat 100% on room air, temperature was 97.7.   Sodium was 138, potassium 3.8, chloride 108, bicarb 25, BUN 12,  creatinine 0.67 and glucose of 97.  Her hemoglobin was 12.9, hematocrit  39.2.  Her WBC was 9.6 and platelet was 284.  Her TSH was 0.729.      Ronda Fairly, M.D.  Electronically Signed      Alvester Morin, M.D.  Electronically Signed    YB/MEDQ  D:  01/18/2007  T:  01/18/2007  Job:  [id number]   cc:   Tacey Ruiz, MD

## 2010-11-05 NOTE — Consult Note (Signed)
Michele Gross, Michele Gross             ACCOUNT NO.:  [id number]   MEDICAL RECORD NO.:  [id number]          PATIENT TYPE:  INP   LOCATION:  3711                         FACILITY:  [facility]   PHYSICIAN:  Gerrit Friends. Dietrich Pates, MD, FACCDATE OF BIRTH:  Jan 26, 1956   DATE OF CONSULTATION:  01/17/2007  DATE OF DISCHARGE:                                 CONSULTATION   PRIMARY CARE PHYSICIAN:  Urgent Care.   PRIMARY CARDIOLOGIST:  Doylene Canning. Ladona Ridgel, MD   HISTORY OF PRESENT ILLNESS:  Consultation requested for assessment of  chest pain in this young woman with low cardiovascular risk and a long  history of chest discomfort.  Michele Gross was first seen by our  service approximately 8 years ago for similar symptoms.  She underwent  catheterization at that time, which was reportedly negative.  In the  intervening years, she has had multiple emergency department visits and  admissions for chest discomfort.  She was most recently seen by one of  our cardiologists in March 2007 when she was evaluated by Dr. Ladona Ridgel.  A  stress nuclear study was negative at that time.  She subsequently has  had near constant chest discomfort.  She has symptoms on perhaps 50% of  days.  At most, an interval of 2-or-3 days will elapse when she is pain  free.  She generally uses aspirin on a p.r.n. basis.  She has no real  primary care physician--she is seen at Urgent Care when absolutely  necessary.   Her current symptoms are located in the left upper chest.  These are  moderate in severity.  The quality is achiness.  There is radiation to  the left neck.  There is some exacerbation of discomfort with movement  of the neck.  The duration of the current episode is 48 hours.  She  requested evaluation because of a discomfort that is more severe than  usual.  There is no associated dyspnea.  There was some nausea and  diaphoresis but no emesis.  She has problems with chronic headaches,  which have also occurred in conjunction  with the discomfort.   Michele Gross does not have hypertension nor diabetes.  She does not use  tobacco products.  I have no information regarding her lipid status, but  she is not treated with medication.  She was premenopausal at the time  of a partial hysterectomy, approximately a year ago from  menometrorrhagia.  There is a history of GERD and excessive weight.   SOCIAL HISTORY:  No tobacco nor ethanol use; does smoke some marijuana.   FAMILY HISTORY:  No prominent coronary disease.   REVIEW OF SYSTEMS:  Requires corrective lenses; arthritic discomfort in  the knees.  Intermittent fatigue.  Recent night sweats with diaphoresis;  occasional palpitations; occasional sense of doom with anxiety.  Urinary  urgency.  All other systems reviewed and are negative.   PHYSICAL EXAMINATION:  GENERAL:  On exam, a very pleasant overweight  woman in no acute distress.  VITAL SIGNS:  Blood pressure 115/55, temperature 97, heart rate 85 and  regular, respirations 20, O2 saturation 100% on  supplemental oxygen.  Weight 108 kg.  HEENT:  Anicteric sclerae; normal lids and conjunctiva; normal oral  mucosa.  SKIN:  No significant lesions.  PSYCHIATRIC:  Alert and oriented; normal affect.  NECK:  No jugular venous distension; normal carotid upstrokes without  bruits.  ENDOCRINE:  No thyromegaly.  HEMATOPOIETIC:  No adenopathy.  LUNGS:  Minimal inspiratory rhonchi.  CARDIAC:  Normal first and second heart sounds; fourth heart sound  present; normal PMI.  ABDOMEN:  Soft and nontender; no organomegaly.  EXTREMITIES:  No edema; distal pulses intact.  NEUROMUSCULAR:  Symmetric strength and tone; normal cranial nerves.   EKG:  Normal sinus rhythm; right ventricular conduction delay; shallow  anterior and inferior T-wave inversions.  When compared to her prior  tracing of August 23, 2006, there has been no significant interval change.   Laboratory otherwise notable for:  A normal CBC, a normal  chemistry  profile, normal coagulation studies, normal LFTs except for albumin of  3.3, normal cardiac markers.  A lipid profile is acceptable with a total  cholesterol of 180, triglycerides of 83, HDL of 58 and LDL of 105.  Cardiac markers are negative.  Hemoglobin A1c is normal.  TSH is normal.  D-dimer is normal.   IMPRESSION:  Michele Gross has incredibly frequent episodes of chest  discomfort for which no cardiac etiology has been found during multiple  past evaluations over nearly a decade.  Although she certainly is not  immune from coronary disease, the risks and adverse consequences of  frequent testing are of more concern for her, than the chance that she  will develop CAD that is missed.  I would not proceed with any  additional cardiac testing.  I would treat her symptomatically,  determine if psychiatric assessment and treatment would be valuable; and  refer her back to [place] in the future should more compelling evidence of  cardiac disease develop.   Per      Gerrit Friends. Dietrich Pates, MD, [facility]  Electronically Signed     RMR/MEDQ  D:  01/17/2007  T:  01/18/2007  Job:  [id number]

## 2010-11-08 NOTE — H&P (Signed)
NAME:  Michele Gross, Michele Gross             ACCOUNT NO.:  [id number]   MEDICAL RECORD NO.:  [id number]          PATIENT TYPE:  INP   LOCATION:  1429                         [facility]AC[facility]   PHYSICIAN:  Kela Millin, M.D.DATE OF BIRTH:  Jan 13, 1956   DATE OF ADMISSION:  08/22/2005  DATE OF DISCHARGE:                                HISTORY & PHYSICAL   PRIMARY CARE PHYSICIAN:  Unassigned.   CHIEF COMPLAINT:  Chest pain.   HISTORY OF PRESENT ILLNESS:  The patient is a 55 year old obese black female  with past medical history significant for previous admissions for chest pain  and cath done in 2000 with normal coronaries, GERD, past history of  polysubstance abuse who presents with above complaints. She states that she  has had the chest pain x1 week and it has been constant but got worse on the  day of presentation. The patient states that the pain is all across her  chest and was very severe in intensity for the one hour prior to her  presentation in the ER and associated with shortness of breath. She denies  any associated nausea, vomiting and no radiation. ? diaphoresis. She denies  cough, fevers, hematemesis, hematochezia, dysuria, and no melena. She states  that she quit tobacco, marijuana and alcohol use 2 months ago.   The patient was seen in the ER and EKG revealed some T wave inversions.  Point of care markers were negative, chest x-ray was no acute disease. She  is admitted to the [facility] hospitalist service for evaluation and management.   PAST MEDICAL HISTORY:  As stated above.   MEDICATIONS:  None.   ALLERGIES:  NKDA.   SOCIAL HISTORY:  As above - states that she quit tobacco, alcohol, and  marijuana 2 months ago.   FAMILY HISTORY:  Her father had colon cancer, denies any family history of  MIs or coronary artery disease.   REVIEW OF SYSTEMS:  As above, other review of systems negative.   PHYSICAL EXAMINATION:  GENERAL:  The patient is an obese, middle-aged, black  female, in no apparent distress.  VITAL SIGNS:  Temperature is 97.7 with a blood pressure of a 119/92, pulse  is 83, respiratory rate 20, O2 sat is 98%.  HEENT:  PERRL, EOMI, sclera anicteric, moist mucous membranes.  NECK:  Supple, no adenopathy and no thyromegaly.  LUNGS:  Clear to auscultation bilaterally, no crackles or wheezes.  CARDIOVASCULAR:  Regular rate and rhythm, normal S1, S2, no S3 appreciated.  ABDOMEN:  Obese, bowel sounds present, soft, nontender, nondistended.  EXTREMITIES:  No cyanosis or edema.  NEUROLOGIC:  Alert and oriented x3. Cranial nerves II-XII grossly intact.   LABORATORY DATA:  Sodium is 140 with a potassium of 3.8, chloride 106, CO2  27, glucose 84, BUN 11, creatinine 0.6, calcium 9.6. D-dimer is 0.23. Point  of care markers negative.   Chest x-ray - no active disease.   ASSESSMENT/PLAN:  1.  Chest pain - GI versus cardiac, will rule out myocardial infarction.      Obtain serial cardial enzymes as EKG with T wave inversions. Will place  patient on aspirin, nitrates, morphine p.r.n. Will follow and consult      cardiology for further recommendations. Noted that patient had a cardiac      cath in 2000 with normal coronaries.  2.  History of polysubstance abuse - obtain a urine drug screen.  3.  Gastroesophageal reflux disease - place the patient on PPI.  4.  Morbid obesity.      Kela Millin, M.D.  Electronically Signed     ACV/MEDQ  D:  08/23/2005  T:  08/23/2005  Job:  [id number]

## 2010-11-08 NOTE — H&P (Signed)
Michele Gross. [facility]  Patient:    Michele Gross                       MRN: [id number] Adm. Date:  [id number] Attending:  McDiarmid, Leighton Roach. Dictator:   Birdena Jubilee, M.D.                         History and Physical  PROBLEM LIST: 1. Chest pain/the patient had a recent hospitalization November 2000 for    chest pain/rule out myocardial infarction.  She had a normal catheterization  at that time. 2. Sore throat for one month. 3. Leg/feet cramps. 4. Gastroesophageal reflux disease. 5. History of polysubstance abuse. 6. Multiple stressors at home.  CHIEF COMPLAINT:  Chest pain.  HISTORY OF PRESENT ILLNESS:  Ms. Michele Gross is a 55 year old African-American female, who was recently admitted to [facility] for chest pain, rule out MI in November of 2000. She had a normal catheterization at that time which showed an ejection fraction of 65%.  The patient was using cocaine at the time and it was felt that this was contributing to her symptoms.  The patient denies any drug use since her discharge in November.  Yesterday afternoon at work (June 04, 1999), she began having  substernal chest pain, which she ranks 7/10.  She felt that this was very similar to the pain she had one month ago.  She also had shortness of breath and diaphoresis associated with the pain.  The pain seemed to ease somewhat and she  went home and cooked dinner and went about her normal activities.  She was awakened at midnight with a more severe chest pain ranking it at 10/10.  Upon arrival to the emergency room the chest pain improved with two sublingual nitroglycerin and morphine.  She was noted to have some mild changes on her ECG and then again is  being admitted for rule out MI.  PAST MEDICAL HISTORY:  Chest pain, admission May 02, 1999, normal catheterization with an EF of 65%.  PAST SURGICAL HISTORY:  Bilateral tubal ligation.  MEDICATIONS: 1. Vioxx (the patient was  given this in November for "chest wall pain."). 2. Aspirin 325 mg p.o. q.d.  ALLERGIES:  No known drug allergies.  FAMILY HISTORY:  The patients father had colon cancer.  She denies hypertension, coronary artery disease, congestive heart failure, or diabetes mellitus.  SOCIAL HISTORY:  The patient is single.  She lives with her boyfriend.  She has a new job at [employer].  She has four grown children.  One of her daughters is currently pregnant and is incarcerated, which is giving the patient a lot of stress.  The patient denies alcohol/tobacco/drug use since her last hospitalization.  "She got saved."  The patient does have a negative drug screen this admission.  Her last drug screen was positive for cocaine, opiates, barbituates, and TCA.  REVIEW OF SYSTEMS:  She denies any visual problems or headache.  She has had a ore throat for the last month, however, denies nasal discharge or cough.  No fever,  chills, weight loss, or fatigue.  She does report indigestion when eating greasy foods.  She takes Rolaids for this.  She denies diarrhea, constipation, bright ed blood per rectum, melena.  She has not had any rash.  She does have cramps in her feet.  She denies dysuria.  LABORATORY DATA:  WBC 12.1, hemoglobin 13.2, hematocrit 38.7, platelet  333. Sodium 135, potassium 3.8, chloride 108, CO2 24, BUN 12, creatinine 0.6, glucose 97, calcium 9.2, total protein 7.1, albumin 3.8.  AST 29, ALT 25.  Alk. phos. 60. Total bilirubin 0.5.  CK 87, CK-MB 0.5.  Troponin I less than 0.03.  UA many epithelial cells.  WBC 6.9.  Small LE.  No nitrate or blood.  Urine drug screen  negative.  PT 12.9, INR 1.0, PTT 30.  Rapid strep negative.  ECG shows flipped Ts in leads III, V2, V3, V4.  This is new since November. Chest x-ray:  No acute disease.  PHYSICAL EXAMINATION:  VITAL SIGNS:  Temperature 99.4, pulse 75 to 94.  Blood pressure at 94 to 107/46 to 71.  O2 saturation 100% room  air.  GENERAL:  The patient is a very pleasant well-nourished, well-developed female n no acute distress.  HEENT:  Pupils, equal, round, reactive to light.  EOMI.  Sclerae muddy.  TMs gray with good light reflex.  Oropharynx slightly erythematous.  NECK:  Supple.  No lymphadenopathy.  No carotid bruit.  No thyromegaly.  LUNGS:  Clear.  CARDIAC:  Regular rate and rhythm.  ABDOMEN:  Soft, nontender.  No hepatosplenomegaly.  No abdominal bruit.  RECTAL:  Guaiac-negative.  EXTREMITIES:  No clubbing, cyanosis, or edema.  DTRs 2-3+ (brisk pulse 2+). She does have pain in her feet.  Strength 5/5 upper and lower extremities.  MENTAL STATUS:  Psychiatric:  Bright affect.  NEUROLOGICAL:  Oriented x 4.  ASSESSMENT:  The patient is a 56 year old African-American female with chest pain. 1. Cardiac.  We will admit the patient for chest pain/rule out myocardial    infarction.  Given the fact that her pain improved with sublingual nitroglycerin    and she does have electrocardiogram changes over her last admission, this is  somewhat worrisome for cardiac involvement.  However, it is doubtful given the    fact that she had a recent catheterization which was normal.  We will continue    to rule her out with enzymes q.8h. x 2.  We will notify cardiology of her    admission. 2. Sore throat.  The patient has had symptoms for one month, question whether or    not she is having postnasal drip or if this is related to her reflux.  Her    rapid strep was negative.  We will culture this. 3. Gastroesophageal reflux disease.  Her symptoms are consistent with reflux. e    will treat with Pepcid and see if this will help her sore throat. 4. Questionable panic attacks.  The patient admits that she is under a lot    of stress.  When asked about signs and symptoms that are associated with    panic attacks she does report having them.  We can consider an ______ to    help alleviate her symptoms. 5.  Disposition.  The patient will be discharged to home after having ruled out    this admission.  The patient does not currently have a primary care physician     and this is something that hopefully we can arrange on this admission. DD:  06/05/99 TD:  06/05/99 Job: [id number]

## 2010-11-08 NOTE — Discharge Summary (Signed)
NAME:  Michele Gross, TENNISON             ACCOUNT NO.:  [id number]   MEDICAL RECORD NO.:  [id number]          PATIENT TYPE:  INP   LOCATION:  1429                         FACILITY[facility]   PHYSICIAN:  Kela Millin, M.D.DATE OF BIRTH:  1956-06-13   DATE OF ADMISSION:  08/22/2005  DATE OF DISCHARGE:  08/26/2005                                 DISCHARGE SUMMARY   DISCHARGE DIAGNOSES:  1.  Chest pain - likely secondary to gastroesophageal reflux disease.      Cardiolite stress test negative.  2.  Gastroesophageal reflux disease.  3.  History of polysubstance abuse.  4.  Morbid obesity.   CONSULTATIONS:  Cardiology - Dr. Lewayne Bunting.   HISTORY:  The patient is a 55 year old obese black female with past medical  history significant for previous admissions of chest pain status post  catheterization in 2000 -  found to have normal coronaries, GERD and past  history of polysubstance abuse who presented with complaint of chest pain.  She reported that she had had chest pain for one week - described as  constant and a lot worse on the day of presentation, she reported that the  pain was all across her chest and lasted for an hour prior to coming to the  ER and was very severe. She denied associated nausea, vomiting and no  radiation. She denied cough, fever, hematemesis, hematochezia, dysuria and  no melena. She reported that she had quit tobacco and marijuana and alcohol  use two months prior to presentation. In the ER, an EKG revealed some T-wave  inversions. Point of care markers were negative. Chest x-ray showed no acute  disease. She was admitted to the [facility] for further  evaluation and management.   PHYSICAL EXAMINATION:  VITAL SIGNS:  Physical exam upon admission revealed a  temperature of 97.7 with a blood pressure of 119/92, pulse of 83,  respiratory rate 20.  LUNGS:  Her lungs were clear to auscultation bilaterally with crackles or  wheezes.  CARDIOVASCULAR:   Regular rate and rhythm. Normal S1 and S2. No S3  appreciated. The rest of her physical exam was also noted to be within  normal limits.   LABORATORY DATA:  Sodium 140 with a potassium of 3.8, chloride 106, CO2 27,  glucose of 84, BUN 11, creatinine 0.6, calcium 9.6. D-dimer 0.23. Point of  care markers negative.   HOSPITAL COURSE:  Chest pain. On admission. The patient had serial cardiac  enzymes done, and those were negative. Cardiology was consulted, and Dr.  Lewayne Bunting saw the patient. His impression was that with negative cardiac  markers despite ongoing pain he was going to review the catheterization  films and recommended continuing the protein pump inhibitor. A stress  Myoview on Oct 26, 2005, and it was negative for ischemia. Following these  findings, the patient's chest pain was attributed to gastroesophageal reflux  disease. She was maintained on a protein pump inhibitor and discharged home.   DISCHARGE MEDICATIONS:  Prilosec 2 tablets daily.   FOLLOWUP CARE:  Primary care physician as scheduled.   DISCHARGE CONDITION:  Improved/stable.  Kela Millin, M.D.  Electronically Signed     ACV/MEDQ  D:  11/19/2005  T:  11/19/2005  Job:  [id number]

## 2010-11-08 NOTE — Op Note (Signed)
NAME:  Michele Gross, Michele Gross                       ACCOUNT NO.:  [id number]   MEDICAL RECORD NO.:  [id number]                   PATIENT TYPE:  INP   LOCATION:  9308                                 FACILITY:  [facility]   PHYSICIAN:  Kathreen Cosier, M.D.           DATE OF BIRTH:  [birth year]   DATE OF PROCEDURE:  05/03/2003  DATE OF DISCHARGE:                                 OPERATIVE REPORT   PREOPERATIVE DIAGNOSIS:  Degenerating myomas.   SURGEON:  Kathreen Cosier, M.D.   FIRST ASSISTANT:  Charles A. Clearance Coots, M.D.   ANESTHESIA:  General anesthesia.   DESCRIPTION OF PROCEDURE:  The patient placed on the operating table in the  supine position.  General anesthesia was induced.  Abdomen prepped and  draped.  Bladder emptied with Foley catheter.  Transverse suprapubic  incision carried out to rectus fascia.  The fascia cleanly incised the  length of the incision.  The recti muscles retracted laterally.  The  peritoneum incised the length of the incision.  The uterus was involved with  multiple myomas.  The tubes and ovaries normal.  She had a previous tubal  ligation.  The right round ligament was grasped with a Kelly clamp, cut and  suture ligated with #1 chromic.  Procedure done in similar fashion on other  side.  Using Metzenbaum scissors, the bladder was dissected off the uterus  and pushed off of the cervix.  The right utero-ovarian ligament was grasped  with Kelly clamp, cut, suture ligated with #1 chromic.  Procedure done in  similar fashion on other side.  The uterine vessels cut and ligated  bilaterally, double clamped with rightward Heaney clamps,  suture ligated x2  with #1 chromic.  Procedure done similarly on the other side.  Cardinal and  uterosacral ligaments grasped with straight Kocher clamp on the right, cut  and suture ligated with #1 chromic, done in similar fashion on other side.  Specimen consisted of uterus removed at the cervicovaginal junction with  Mayo  scissors, modified Richardson sutures placed on the angles of vagina  and the vaginal vault run with interlocking #1 chromic.  Hemostasis was  satisfactory.  The vault was left open.  The operative site was  reperitonealized with 2-0 chromic.  The left tube was grasped with a Babcock  clamp, 0 plain suture placed across the mesosalpinx below the tube and the  tube was removed.  Hemostasis was achieved with one suture of #1 chromic.  Procedure was done similarly on the other side.  Lap and sponge counts  correct.  Blood loss was 200 mL.  Abdomen closed in layers.  Peritoneum with  continuous suture of 2-0_____, the fascia with continuous suture of ______  and the skin closed with subcuticular stitch of 3-0 Monocryl. Blood loss 200  cc.  Kathreen Cosier, M.D.    BAM/MEDQ  D:  05/03/2003  T:  05/03/2003  Job:  [id number]

## 2010-11-08 NOTE — Consult Note (Signed)
Michele Gross, SCRONCE             ACCOUNT NO.:  [id number]   MEDICAL RECORD NO.:  [id number]          PATIENT TYPE:  INP   LOCATION:  1429                         FACILITY:  [facility]   PHYSICIAN:  Doylene Canning. Ladona Ridgel, M.D.  DATE OF BIRTH:  23-Oct-1955   DATE OF CONSULTATION:  08/23/2005  DATE OF DISCHARGE:                                   CONSULTATION   CONSULTATION REQUESTED BY:  Kela Millin, M.D.   INDICATION FOR CONSULTATION:  Evaluation of chest pain.   Ms. Cobert is a 55 year old woman, morbidly obese, with a history of  polysubstance abuse, who was admitted to the hospital with a chief complaint  of chest pain which began approximately four days ago. She was found to have  an abnormal EKG and other cardiac risk factors including obesity. She has a  history of negative catheterization in 2000 and her initial cardiac  markers were all negative. She is admitted for additional evaluation. The  patient denies fever or chills. She denies cough. She denies a pleuritic  component to her pain, however it is not exertional. There is no  relationship to position or movement. There is some associated dyspnea.  There is no diaphoresis. She describes the pain as being fairly constant and  radiating to under her breast. She describes it as a 7/10, though during  pain she does not appear to be in any discomfort. She denies nausea or  vomiting.   PAST MEDICAL HISTORY:  Notable for history of substance with toxicity  screening revealing marijuana. She has a history of gastroesophageal reflux  disease and longstanding morbid obesity. She denies diabetes and  hypertension.   SOCIAL HISTORY:  The patient denies tobacco use or ethanol use.   FAMILY HISTORY:  Noncontributory.   REVIEW OF SYSTEMS:  Negative for vision or hearing problems. She does wear  glasses to read. She denies nausea, vomiting, diarrhea, constipation,  polyuria, polydipsia, heat or cold intolerance, recent weight  changes, skin  problems. She does note a partial hysterectomy. She does note knee pain on  the left which has been present for several days. She denies cough,  hemoptysis, or claudication. She denies neurologic problems and denies any  difficulty with sleep or insomnia. She denies anorexia. The rest of her  review of systems was negative.   PHYSICAL EXAMINATION:  GENERAL: She is a pleasant, morbidly obese, middle-  age woman in no acute distress.  VITAL SIGNS: Initial blood pressure was 147/78, pulse 80 and regular,  respirations 18.  HEENT: Normocephalic and atraumatic. Pupils equal, round. The oropharynx is  moist. Sclerae anicteric.  NECK: 7-8 cm jugular venous distention. There is no thyromegaly. Trachea  midline. Carotids are 2+ and symmetric.  LUNGS: Clear bilaterally to auscultation. No rales, rhonchi, or wheezes.  There is no increased work of breathing.  CARDIOVASCULAR: Regular rate and rhythm with normal S1 and S2. There is a  fairly loud S4 gallop present. The PMI is not laterally displaced or  enlarged.  ABDOMEN: Soft, nontender, nondistended. There is no organomegaly. There is  no rebound or guarding. The bowel sounds are present.  EXTREMITIES: No clubbing or edema. Pulses were 2+ and symmetric.  SKIN: Normal.  NEUROLOGIC: The patient is alert and oriented times three. Cranial nerves  intact. Strength 5/5 and symmetric.   EKG demonstrates sinus rhythm with nonspecific T-wave abnormality.   IMPRESSION:  1.  Chest pain for several days, associated with an abnormal EKG and cardiac      risk factors, but with negative cardiac catheterization several years      ago, the details which we do not have, and associated with negative      cardiac markers despite ongoing pain.  2.  Obesity.  3.  History of substance abuse.  4.  Apparent gastroesophageal reflux disease.  5.  With negative cardiac markers, despite ongoing  pin. I plan to review      her cath films.  Would  recommend that she continued on  proton pump      inhibitors and will consider.           ______________________________  Doylene Canning. Ladona Ridgel, M.D.     GWT/MEDQ  D:  08/23/2005  T:  08/24/2005  Job:  [id number]   cc:   Kela Millin, M.D.

## 2010-11-08 NOTE — Discharge Summary (Signed)
   NAME:  Michele Gross, Michele Gross                       ACCOUNT NO.:  [id number]   MEDICAL RECORD NO.:  [id number]                   PATIENT TYPE:  INP   LOCATION:  9308                                 FACILITY:  [facility]   PHYSICIAN:  Kathreen Cosier, M.D.           DATE OF BIRTH:  [birth year]   DATE OF ADMISSION:  05/03/2003  DATE OF DISCHARGE:                                 DISCHARGE SUMMARY   HOSPITAL COURSE:  The patient is a 55 year old female with a history of  degenerating myomas who was admitted today to the hospital for a TAH and  bilateral salpingectomy; she wanted to maintain her ovaries.  She was a  gravida 4 para 4-0-0-3.  On admission her sodium was 139, potassium 4.1,  chloride 102, glucose 74, BUN 14, and creatinine 0.8.  PT/PTT normal.  Urine  nitrite was positive.  The patient underwent a TAH and bilateral  salpingectomy on May 03, 2003.  Postoperatively she did well.  She was  discharged on postoperative day #3, ambulatory and on a regular diet, on  Demerol for pain 50-100 mg p.o. q.3-4h. p.r.n. and Macrobid for her positive  nitrite.   DISCHARGE DIAGNOSES:  Status post total abdominal hysterectomy and bilateral  salpingectomy from degenerating myoma uteri.                                               Kathreen Cosier, M.D.    BAM/MEDQ  D:  05/06/2003  T:  05/06/2003  Job:  [id number]

## 2010-12-30 ENCOUNTER — Emergency Department (HOSPITAL_COMMUNITY)
Admission: EM | Admit: 2010-12-30 | Discharge: 2010-12-30 | Disposition: A | Discharge status: Home / Self Care | Payer: Self-pay | Attending: Emergency Medicine | Admitting: Emergency Medicine

## 2010-12-30 ENCOUNTER — Emergency Department (HOSPITAL_COMMUNITY): Payer: Self-pay

## 2010-12-30 DIAGNOSIS — M549 Dorsalgia, unspecified: Secondary | ICD-10-CM | POA: Insufficient documentation

## 2010-12-30 DIAGNOSIS — G8929 Other chronic pain: Secondary | ICD-10-CM | POA: Insufficient documentation

## 2010-12-30 DIAGNOSIS — Z79899 Other long term (current) drug therapy: Secondary | ICD-10-CM | POA: Insufficient documentation

## 2010-12-30 DIAGNOSIS — R0602 Shortness of breath: Secondary | ICD-10-CM | POA: Insufficient documentation

## 2010-12-30 DIAGNOSIS — R071 Chest pain on breathing: Secondary | ICD-10-CM | POA: Insufficient documentation

## 2010-12-30 LAB — COMPREHENSIVE METABOLIC PANEL
Albumin: 3.7 g/dL (ref 3.5–5.2)
BUN: 13 mg/dL (ref 6–23)
Calcium: 9.5 mg/dL (ref 8.4–10.5)
Creatinine, Ser: 0.65 mg/dL (ref 0.50–1.10)
Potassium: 3.4 mEq/L — ABNORMAL LOW (ref 3.5–5.1)
Total Protein: 7.9 g/dL (ref 6.0–8.3)

## 2010-12-30 LAB — DIFFERENTIAL
Basophils Absolute: 0 10*3/uL (ref 0.0–0.1)
Eosinophils Relative: 1 % (ref 0–5)
Lymphocytes Relative: 35 % (ref 12–46)
Neutro Abs: 6.6 10*3/uL (ref 1.7–7.7)

## 2010-12-30 LAB — CBC
HCT: 41.3 % (ref 36.0–46.0)
RDW: 15.2 % (ref 11.5–15.5)
WBC: 11.5 10*3/uL — ABNORMAL HIGH (ref 4.0–10.5)

## 2010-12-30 LAB — CK TOTAL AND CKMB (NOT AT ARMC)
CK, MB: 1.5 ng/mL (ref 0.3–4.0)
Relative Index: INVALID (ref 0.0–2.5)
Total CK: 90 U/L (ref 7–177)

## 2010-12-30 LAB — D-DIMER, QUANTITATIVE: D-Dimer, Quant: 0.24 ug/mL-FEU (ref 0.00–0.48)

## 2011-01-06 ENCOUNTER — Other Ambulatory Visit: Payer: Self-pay | Admitting: Family Medicine

## 2011-01-06 DIAGNOSIS — N6310 Unspecified lump in the right breast, unspecified quadrant: Secondary | ICD-10-CM

## 2011-01-06 DIAGNOSIS — N644 Mastodynia: Secondary | ICD-10-CM

## 2011-01-16 ENCOUNTER — Other Ambulatory Visit: Payer: Self-pay

## 2011-01-22 ENCOUNTER — Ambulatory Visit
Admission: RE | Admit: 2011-01-22 | Discharge: 2011-01-22 | Disposition: A | Discharge status: Home / Self Care | Payer: Self-pay | Source: Ambulatory Visit | Attending: Family Medicine | Admitting: Family Medicine

## 2011-01-22 DIAGNOSIS — N644 Mastodynia: Secondary | ICD-10-CM

## 2011-01-22 DIAGNOSIS — N6310 Unspecified lump in the right breast, unspecified quadrant: Secondary | ICD-10-CM

## 2011-02-16 ENCOUNTER — Emergency Department (HOSPITAL_COMMUNITY): Payer: Self-pay

## 2011-02-16 ENCOUNTER — Emergency Department (HOSPITAL_COMMUNITY)
Admission: EM | Admit: 2011-02-16 | Discharge: 2011-02-16 | Disposition: A | Discharge status: Home / Self Care | Payer: Self-pay | Attending: Emergency Medicine | Admitting: Emergency Medicine

## 2011-02-16 DIAGNOSIS — I517 Cardiomegaly: Secondary | ICD-10-CM | POA: Insufficient documentation

## 2011-02-16 DIAGNOSIS — M549 Dorsalgia, unspecified: Secondary | ICD-10-CM | POA: Insufficient documentation

## 2011-02-16 DIAGNOSIS — E669 Obesity, unspecified: Secondary | ICD-10-CM | POA: Insufficient documentation

## 2011-02-16 DIAGNOSIS — R0989 Other specified symptoms and signs involving the circulatory and respiratory systems: Secondary | ICD-10-CM | POA: Insufficient documentation

## 2011-02-16 DIAGNOSIS — R0609 Other forms of dyspnea: Secondary | ICD-10-CM | POA: Insufficient documentation

## 2011-02-16 DIAGNOSIS — R0789 Other chest pain: Secondary | ICD-10-CM | POA: Insufficient documentation

## 2011-02-16 DIAGNOSIS — G8929 Other chronic pain: Secondary | ICD-10-CM | POA: Insufficient documentation

## 2011-02-16 DIAGNOSIS — I4510 Unspecified right bundle-branch block: Secondary | ICD-10-CM | POA: Insufficient documentation

## 2011-02-16 LAB — CBC
MCH: 27.3 pg (ref 26.0–34.0)
MCHC: 33.3 g/dL (ref 30.0–36.0)
MCV: 82.1 fL (ref 78.0–100.0)
Platelets: 312 10*3/uL (ref 150–400)
RDW: 15 % (ref 11.5–15.5)

## 2011-02-16 LAB — COMPREHENSIVE METABOLIC PANEL
Alkaline Phosphatase: 80 U/L (ref 39–117)
BUN: 10 mg/dL (ref 6–23)
Calcium: 9.8 mg/dL (ref 8.4–10.5)
Creatinine, Ser: 0.58 mg/dL (ref 0.50–1.10)
GFR calc Af Amer: >60 mL/min (ref >60)
Glucose, Bld: 110 mg/dL — ABNORMAL HIGH (ref 70–99)
Potassium: 3.9 mEq/L (ref 3.5–5.1)
Total Protein: 7.7 g/dL (ref 6.0–8.3)

## 2011-02-16 LAB — DIFFERENTIAL
Eosinophils Absolute: 0.2 10*3/uL (ref 0.0–0.7)
Eosinophils Relative: 2 % (ref 0–5)
Lymphs Abs: 3.3 10*3/uL (ref 0.7–4.0)
Monocytes Relative: 7 % (ref 3–12)

## 2011-02-16 LAB — D-DIMER, QUANTITATIVE: D-Dimer, Quant: 0.32 ug/mL-FEU (ref 0.00–0.48)

## 2011-02-19 ENCOUNTER — Ambulatory Visit (HOSPITAL_BASED_OUTPATIENT_CLINIC_OR_DEPARTMENT_OTHER): Payer: Self-pay | Attending: Internal Medicine

## 2011-02-19 DIAGNOSIS — G4733 Obstructive sleep apnea (adult) (pediatric): Secondary | ICD-10-CM | POA: Insufficient documentation

## 2011-02-19 DIAGNOSIS — I4949 Other premature depolarization: Secondary | ICD-10-CM | POA: Insufficient documentation

## 2011-02-19 DIAGNOSIS — I491 Atrial premature depolarization: Secondary | ICD-10-CM | POA: Insufficient documentation

## 2011-02-22 DIAGNOSIS — G4733 Obstructive sleep apnea (adult) (pediatric): Secondary | ICD-10-CM

## 2011-02-22 NOTE — Procedures (Signed)
NAME:  Michele Gross, SAHM             ACCOUNT NO.:  [id number]  MEDICAL RECORD NO.:  [id number]          PATIENT TYPE:  OUT  LOCATION:  SLEEP CENTER                 FACILITY:  [facility]  PHYSICIAN:  Clinton D. Maple Hudson, MD, FCCP, FACPDATE OF BIRTH:  1955/11/20  DATE OF STUDY:  02/19/2011                           NOCTURNAL POLYSOMNOGRAM  REFERRING PHYSICIAN:  SAMI HASSAN  INDICATION FOR STUDY:  Hypersomnia with sleep apnea.  EPWORTH SLEEPINESS SCORE:  17/24, BMI 47.8, weight 270 pounds, height 63 inches, neck 16 inches.  MEDICATIONS:  Charted and reviewed.  SLEEP ARCHITECTURE:  Total sleep time 280.5 minutes with sleep efficiency 72%.  Stage I was 5%, stage II 75.9%, stage III 0.2%, REM 18.9% of total sleep time.  Sleep latency 3.5 minutes, REM latency 178 minutes, awake after sleep onset 105 minutes, arousal index 51.3.  BEDTIME MEDICATION:  Hydrocodone with acetaminophen.  After initial sleep onset at about 10:30, she woke at about 11:30 p.m. and was awake until 1 a.m. before regaining sleep.  RESPIRATORY DATA:  Apnea/hypopnea index (AHI) 53.3 per hour.  A total of 249 events was scored including 51 obstructive apneas, 11 central apneas, 2  mixed apneas, 185 hypopneas.  Events were seen in all sleep positions.  REM AHI 92.8 per hour.  Because of her delay in sustained sleep onset, events were not present in significant numbers until nearly 2 a.m. which was too late to permit application of split protocol CPAP titration on this study night.  OXYGEN DATA:  Moderate-to-occasionally loud snoring with oxygen desaturation to a nadir of 86% and a mean oxygen saturation through the study of 95.3% on room air.  CARDIAC DATA:  Sinus rhythm with PACs and PVCs.  MOVEMENT-PARASOMNIA:  No significant movement disturbance.  Bathroom x2.  IMPRESSIONS-RECOMMENDATIONS: 1. Severe obstructive sleep apnea/hypopnea syndrome, apnea/hypopnea     index 53.3 per hour.  Non-positional events.   Moderate-to-loud     snoring with oxygen desaturation to a nadir of 86% and a mean     oxygen saturation through the study of 95.3% on room air. 2. She was unable to initiate sleep early enough to meet the     requirements for application of continuous positive     airway pressure split protocol titration on the study night.     Consider return for dedicated CPAP titration study or evaluate for     alternative management as clinically appropriate.     Clinton D. Maple Hudson, MD, [facility], FACP Diplomate, Biomedical engineer of Sleep Medicine Electronically Signed    CDY/MEDQ  D:  02/22/2011 10:00:13  T:  02/22/2011 10:18:34  Job:  [id number]

## 2011-03-18 LAB — DIFFERENTIAL
Band Neutrophils: 0
Blasts: 0
Lymphocytes Relative: 38
Lymphs Abs: 5.7 — ABNORMAL HIGH
Monocytes Absolute: 0.8
Monocytes Relative: 5
nRBC: 0

## 2011-03-18 LAB — POCT I-STAT, CHEM 8
BUN: 9
Calcium, Ion: 1.23
Creatinine, Ser: 0.8
Glucose, Bld: 96
TCO2: 27

## 2011-03-18 LAB — POCT CARDIAC MARKERS: Troponin i, poc: <0.05

## 2011-03-18 LAB — CBC
Hemoglobin: 14.5
RDW: 15.8 — ABNORMAL HIGH

## 2011-03-26 LAB — POCT I-STAT, CHEM 8
BUN: 13
Chloride: 110
Potassium: 3.9
Sodium: 135

## 2011-03-26 LAB — POCT CARDIAC MARKERS
CKMB, poc: <1 — ABNORMAL LOW
Myoglobin, poc: 45.7

## 2011-03-31 ENCOUNTER — Emergency Department (HOSPITAL_COMMUNITY): Payer: Self-pay

## 2011-03-31 ENCOUNTER — Emergency Department (HOSPITAL_COMMUNITY)
Admission: EM | Admit: 2011-03-31 | Discharge: 2011-03-31 | Disposition: A | Discharge status: Home / Self Care | Payer: Self-pay | Attending: Emergency Medicine | Admitting: Emergency Medicine

## 2011-03-31 DIAGNOSIS — IMO0001 Reserved for inherently not codable concepts without codable children: Secondary | ICD-10-CM | POA: Insufficient documentation

## 2011-03-31 DIAGNOSIS — M545 Low back pain, unspecified: Secondary | ICD-10-CM | POA: Insufficient documentation

## 2011-03-31 DIAGNOSIS — G8929 Other chronic pain: Secondary | ICD-10-CM | POA: Insufficient documentation

## 2011-03-31 DIAGNOSIS — R059 Cough, unspecified: Secondary | ICD-10-CM | POA: Insufficient documentation

## 2011-03-31 DIAGNOSIS — R05 Cough: Secondary | ICD-10-CM | POA: Insufficient documentation

## 2011-03-31 DIAGNOSIS — J40 Bronchitis, not specified as acute or chronic: Secondary | ICD-10-CM | POA: Insufficient documentation

## 2011-03-31 LAB — CBC
HCT: 38.2
Hemoglobin: 13
Hemoglobin: 14.1
MCV: 83.4
Platelets: 311
RBC: 5.03
WBC: 9.2

## 2011-03-31 LAB — DIFFERENTIAL
Basophils Absolute: 0
Basophils Relative: 0
Basophils Relative: 1
Eosinophils Absolute: 0.2
Eosinophils Relative: 2
Lymphocytes Relative: 31
Lymphs Abs: 2.9
Monocytes Absolute: 0.7
Monocytes Relative: 8
Neutro Abs: 5.5
Neutrophils Relative %: 59
Neutrophils Relative %: 61

## 2011-03-31 LAB — URINALYSIS, ROUTINE W REFLEX MICROSCOPIC
Bilirubin Urine: NEGATIVE
Bilirubin Urine: NEGATIVE
Glucose, UA: NEGATIVE mg/dL
Hgb urine dipstick: NEGATIVE
Ketones, ur: NEGATIVE
Ketones, ur: NEGATIVE mg/dL
Leukocytes, UA: NEGATIVE
Nitrite: NEGATIVE
Protein, ur: NEGATIVE
Protein, ur: NEGATIVE mg/dL
Specific Gravity, Urine: 1.038 — ABNORMAL HIGH
Urobilinogen, UA: 0.2
Urobilinogen, UA: 0.2
pH: 5
pH: 6 (ref 5.0–8.0)

## 2011-03-31 LAB — URINE MICROSCOPIC-ADD ON

## 2011-03-31 LAB — COMPREHENSIVE METABOLIC PANEL
ALT: 27
AST: 27
Albumin: 3.5
Alkaline Phosphatase: 71
Alkaline Phosphatase: 69
BUN: 11
CO2: 26
Chloride: 107
Creatinine, Ser: 0.54
GFR calc Af Amer: >60
GFR calc non Af Amer: >60
GFR calc non Af Amer: >60
Glucose, Bld: 107 — ABNORMAL HIGH
Glucose, Bld: 101 — ABNORMAL HIGH
Potassium: 3.6
Sodium: 137
Total Bilirubin: 0.6
Total Protein: 7.6

## 2011-03-31 LAB — D-DIMER, QUANTITATIVE: D-Dimer, Quant: 0.23

## 2011-03-31 LAB — URINE CULTURE: Colony Count: NO GROWTH

## 2011-03-31 LAB — POCT CARDIAC MARKERS
Operator id: 270651
Troponin i, poc: <0.05

## 2011-03-31 LAB — LIPASE, BLOOD: Lipase: 17

## 2011-04-04 LAB — URINE MICROSCOPIC-ADD ON

## 2011-04-04 LAB — CBC
HCT: 40.4
MCV: 84
Platelets: 325
RDW: 15.5 — ABNORMAL HIGH

## 2011-04-04 LAB — URINALYSIS, ROUTINE W REFLEX MICROSCOPIC
Glucose, UA: NEGATIVE
Protein, ur: NEGATIVE
Specific Gravity, Urine: 1.02

## 2011-04-04 LAB — URINE CULTURE: Colony Count: >=100000

## 2011-04-07 LAB — HEPATIC FUNCTION PANEL
ALT: 33
Bilirubin, Direct: <0.1

## 2011-04-07 LAB — CBC
HCT: 36.7
Hemoglobin: 12.2
MCHC: 32.9
MCHC: 33.2
MCV: 83
MCV: 84.3
RBC: 4.65
RDW: 14.9 — ABNORMAL HIGH

## 2011-04-07 LAB — CARDIAC PANEL(CRET KIN+CKTOT+MB+TROPI)
CK, MB: 0.8
Relative Index: INVALID
Total CK: 78
Troponin I: 0.01

## 2011-04-07 LAB — POCT CARDIAC MARKERS
CKMB, poc: <1 — ABNORMAL LOW
CKMB, poc: <1 — ABNORMAL LOW
Myoglobin, poc: 64.1
Myoglobin, poc: 74.7
Operator id: 196461
Troponin i, poc: <0.05
Troponin i, poc: <0.05

## 2011-04-07 LAB — URINALYSIS, ROUTINE W REFLEX MICROSCOPIC
Ketones, ur: 15 — AB
Nitrite: NEGATIVE
Protein, ur: NEGATIVE
Urobilinogen, UA: 0.2

## 2011-04-07 LAB — URINE CULTURE: Colony Count: 35000

## 2011-04-07 LAB — BASIC METABOLIC PANEL
BUN: 12
CO2: 25
Calcium: 9
Chloride: 108
Creatinine, Ser: 0.65
GFR calc Af Amer: >60
GFR calc non Af Amer: >60
Glucose, Bld: 107 — ABNORMAL HIGH

## 2011-04-07 LAB — I-STAT 8, (EC8 V) (CONVERTED LAB)
BUN: 10
Chloride: 106
pCO2, Ven: 47.1
pH, Ven: 7.371 — ABNORMAL HIGH

## 2011-04-07 LAB — PROTIME-INR
INR: 1
Prothrombin Time: 13.2

## 2011-04-07 LAB — LIPID PANEL
LDL Cholesterol: 123 — ABNORMAL HIGH
Triglycerides: 72
VLDL: 17

## 2011-04-07 LAB — URINE MICROSCOPIC-ADD ON

## 2011-04-07 LAB — HEMOGLOBIN A1C: Hgb A1c MFr Bld: 5.9

## 2011-04-07 LAB — CK TOTAL AND CKMB (NOT AT ARMC): Total CK: 83

## 2011-04-07 LAB — TSH: TSH: 0.729

## 2011-04-07 LAB — TROPONIN I: Troponin I: 0.01

## 2011-07-19 ENCOUNTER — Emergency Department (HOSPITAL_COMMUNITY): Payer: Self-pay

## 2011-07-19 ENCOUNTER — Emergency Department (HOSPITAL_COMMUNITY)
Admission: EM | Admit: 2011-07-19 | Discharge: 2011-07-19 | Disposition: A | Discharge status: Home / Self Care | Payer: Self-pay | Attending: Emergency Medicine | Admitting: Emergency Medicine

## 2011-07-19 ENCOUNTER — Encounter (HOSPITAL_COMMUNITY): Payer: Self-pay | Admitting: Emergency Medicine

## 2011-07-19 DIAGNOSIS — M25469 Effusion, unspecified knee: Secondary | ICD-10-CM | POA: Insufficient documentation

## 2011-07-19 DIAGNOSIS — W010XXA Fall on same level from slipping, tripping and stumbling without subsequent striking against object, initial encounter: Secondary | ICD-10-CM | POA: Insufficient documentation

## 2011-07-19 DIAGNOSIS — S8000XA Contusion of unspecified knee, initial encounter: Secondary | ICD-10-CM | POA: Insufficient documentation

## 2011-07-19 DIAGNOSIS — M79609 Pain in unspecified limb: Secondary | ICD-10-CM | POA: Insufficient documentation

## 2011-07-19 DIAGNOSIS — M25569 Pain in unspecified knee: Secondary | ICD-10-CM | POA: Insufficient documentation

## 2011-07-19 DIAGNOSIS — IMO0002 Reserved for concepts with insufficient information to code with codable children: Secondary | ICD-10-CM | POA: Insufficient documentation

## 2011-07-19 DIAGNOSIS — S8390XA Sprain of unspecified site of unspecified knee, initial encounter: Secondary | ICD-10-CM

## 2011-07-19 MED ORDER — HYDROCODONE-ACETAMINOPHEN 5-500 MG PO TABS: 1.0000 | ORAL_TABLET | Freq: Four times a day (QID) | ORAL | Status: AC | PRN

## 2011-07-19 MED ORDER — HYDROCODONE-ACETAMINOPHEN 5-325 MG PO TABS
2.0000 | ORAL_TABLET | Freq: Once | ORAL | Status: AC
  Administered 2011-07-19: 2 via ORAL
  Filled 2011-07-19: qty 2

## 2011-07-19 MED ORDER — IBUPROFEN 600 MG PO TABS: 600.0000 mg | ORAL_TABLET | Freq: Four times a day (QID) | ORAL | Status: AC | PRN

## 2011-07-19 NOTE — ED Notes (Signed)
Pt. Stated, I fell yesterday on the ice and I hurt my rt. Leg, took some Ibuprofen and its no better.

## 2011-07-19 NOTE — ED Provider Notes (Signed)
History     CSN: [id number]  Arrival date & time 07/19/11  1219   First MD Initiated Contact with Patient 07/19/11 1252      Chief Complaint  Patient presents with  . Leg Pain    (Consider location/radiation/quality/duration/timing/severity/associated sxs/prior treatment) Patient is a 56 y.o. female presenting with leg pain. The history is provided by the patient.  Leg Pain  Pertinent negatives include no numbness.  pt c/o slip and fall on ice at work yesterday. Fell onto right knee. C/o constant, dull, non radiating right knee pain worse w movement and palpation and walking. Denies prior injury to knee. No hip or ankle pain. Skin intact. Denies head injury or loc. No neck or back pain. Denies other injury.   History reviewed. No pertinent past medical history.  No past surgical history on file.  No family history on file.  History  Substance Use Topics  . Smoking status: Not on file  . Smokeless tobacco: Not on file  . Alcohol Use: 1.2 oz/week    2 Glasses of wine per week     occasuinal    OB History    Grav Para Term Preterm Abortions TAB SAB Ect Mult Living                  Review of Systems  Constitutional: Negative for fever.  HENT: Negative for neck pain.   Respiratory: Negative for shortness of breath.   Cardiovascular: Negative for chest pain.  Gastrointestinal: Negative for abdominal pain.  Musculoskeletal: Negative for back pain.  Neurological: Negative for weakness, numbness and headaches.    Allergies  Review of patient's allergies indicates no known allergies.  Home Medications   Current Outpatient Rx  Name Route Sig Dispense Refill  . HYDROCODONE-ACETAMINOPHEN 5-500 MG PO TABS Oral Take 1 tablet by mouth once. pain    . IBUPROFEN 200 MG PO TABS Oral Take 400 mg by mouth every 6 (six) hours as needed. For pain      BP 121/84  Pulse 89  Temp(Src) 98.5 F (36.9 C) (Oral)  Resp 18  Ht 5\' 4"  (1.626 m)  Wt 265 lb (120.203 kg)  BMI 45.49  kg/m2  SpO2 98%  Physical Exam  Nursing note and vitals reviewed. Constitutional: She is oriented to person, place, and time. She appears well-developed and well-nourished. No distress.  HENT:  Head: Atraumatic.  Eyes: Conjunctivae are normal. No scleral icterus.  Neck: Normal range of motion. Neck supple. No tracheal deviation present.  Cardiovascular: Normal rate.   Pulmonary/Chest: Effort normal. No respiratory distress.  Abdominal: Soft. Normal appearance. There is no tenderness.  Musculoskeletal:       Spine non tender.   Right knee tenderness and mild sts. No gross ligament laxity appreciated. Distal pulses palp. Good rom at hip, knee and ankle.   Neurological: She is alert and oriented to person, place, and time.       Motor intact bil   Skin: Skin is warm and dry. No rash noted.  Psychiatric: She has a normal mood and affect.    ED Course  Procedures (including critical care time)  Dg Knee Complete 4 Views Right  07/19/2011  *RADIOLOGY REPORT*  Clinical Data: Fall, knee pain  RIGHT KNEE - COMPLETE 4+ VIEW  Comparison: None.  Findings: No fracture or dislocation of the right knee.  No joint effusion.  IMPRESSION: No acute osseous abnormality.  Original Report Authenticated By: Genevive Bi, M.D.      MDM  Confirmed nkda. vicodin po, pt has ride, did not drive.    Xrays.         Suzi Roots, MD 07/19/11 1359

## 2011-08-30 ENCOUNTER — Emergency Department (HOSPITAL_COMMUNITY): Payer: Self-pay

## 2011-08-30 ENCOUNTER — Encounter (HOSPITAL_COMMUNITY): Payer: Self-pay | Admitting: Nurse Practitioner

## 2011-08-30 ENCOUNTER — Emergency Department (HOSPITAL_COMMUNITY)
Admission: EM | Admit: 2011-08-30 | Discharge: 2011-08-30 | Disposition: A | Discharge status: Home / Self Care | Payer: Self-pay | Attending: Emergency Medicine | Admitting: Emergency Medicine

## 2011-08-30 DIAGNOSIS — L989 Disorder of the skin and subcutaneous tissue, unspecified: Secondary | ICD-10-CM

## 2011-08-30 DIAGNOSIS — M79609 Pain in unspecified limb: Secondary | ICD-10-CM | POA: Insufficient documentation

## 2011-08-30 DIAGNOSIS — M79606 Pain in leg, unspecified: Secondary | ICD-10-CM

## 2011-08-30 MED ORDER — CEPHALEXIN 500 MG PO CAPS: 500.0000 mg | ORAL_CAPSULE | Freq: Four times a day (QID) | ORAL | Status: AC

## 2011-08-30 MED ORDER — HYDROCODONE-ACETAMINOPHEN 5-325 MG PO TABS: 1.0000 | ORAL_TABLET | ORAL | Status: AC | PRN

## 2011-08-30 MED ORDER — HYDROCODONE-ACETAMINOPHEN 5-325 MG PO TABS
1.0000 | ORAL_TABLET | Freq: Once | ORAL | Status: AC
  Administered 2011-08-30: 1 via ORAL
  Filled 2011-08-30: qty 1

## 2011-08-30 NOTE — ED Provider Notes (Signed)
History     CSN: [id number]  Arrival date & time 08/30/11  1008   First MD Initiated Contact with Patient 08/30/11 1055      Chief Complaint  Patient presents with  . Leg Pain    (Consider location/radiation/quality/duration/timing/severity/associated sxs/prior treatment) Patient is a 56 y.o. female presenting with leg pain. The history is provided by the patient.  Leg Pain  The incident occurred more than 1 week ago. Incident location: She fell on ice approximately 6 weeks ago, onto knees. She complains of persistent bilateral knee pain that is now generalized through entire lower extremities. Pain location: She also reports having stepped on a piece of broken glass and c/o persistent pain in right heel. She feels the glass was in one piece and she pulled all of it out. The pain is moderate. The pain has been constant (It is worse at night when she rests.) since onset. Foreign body present: Possible foreign body to right heel.    History reviewed. No pertinent past medical history.  History reviewed. No pertinent past surgical history.  History reviewed. No pertinent family history.  History  Substance Use Topics  . Smoking status: Never Smoker   . Smokeless tobacco: Not on file  . Alcohol Use: 1.2 oz/week    2 Glasses of wine per week     occassional    OB History    Grav Para Term Preterm Abortions TAB SAB Ect Mult Living                  Review of Systems  Constitutional: Negative for fever and chills.  HENT: Negative.   Respiratory: Negative.   Cardiovascular: Negative.   Gastrointestinal: Negative.   Musculoskeletal:       See HPI.  Skin: Negative.   Neurological: Negative.     Allergies  Review of patient's allergies indicates no known allergies.  Home Medications   Current Outpatient Rx  Name Route Sig Dispense Refill  . ACETAMINOPHEN ER 650 MG PO TBCR Oral Take 1,300 mg by mouth 2 (two) times daily as needed.      BP 123/81  Pulse 78  Temp(Src)  98.4 F (36.9 C) (Oral)  Resp 20  Ht 5\' 3"  (1.6 m)  Wt 265 lb (120.203 kg)  BMI 46.94 kg/m2  SpO2 99%  Physical Exam  Constitutional: She is oriented to person, place, and time. She appears well-developed and well-nourished.  Neck: Normal range of motion.  Pulmonary/Chest: Effort normal.  Musculoskeletal: She exhibits no edema.       Lower extremities bilaterally without swelling, discoloration or focal tenderness. No mass. Pulses present and equal. Right heel has healing laceration with induration medially which could represent palpable foreign body or localized infection. No drainage. No redness. Moderately tender.  Neurological: She is alert and oriented to person, place, and time. She has normal reflexes.  Skin: Skin is warm and dry.    ED Course  Procedures (including critical care time)  Labs Reviewed - No data to display No results found.   No diagnosis found.    MDM          Rodena Medin, PA-C 08/30/11 1259

## 2011-08-30 NOTE — ED Notes (Signed)
States she fell on ice 1 month ago and has bilateral leg pain since. Also stepped in glass that week on R foot and thinks the areas where the glass cut her are getting infected. Ambulatory but with pain

## 2011-08-30 NOTE — Discharge Instructions (Signed)
YOUR FOOT X-RAY IS NOT SHOWING ANY EVIDENCE OF A FOREIGN BODY, HOWEVER, THERE IS A SMALL PERCENTAGE OF FOREIGN BODIES THAT ARE NOT ABLE TO BE SEEN ON AN X-RAY. TAKE KEFLEX FOR ANY INFECTION, NORCO FOR PAIN. FOLLOW UP WITH EITHER DR. REGAL (PODIATRY OR FOOT DOCTOR) OR WITH DR. HEWITT (ORTHOPEDICS) FOR FURTHER EVALUATION IF HEEL PAIN DOES NOT RESOLVE. FOLLOW UP WITH YOUR PRIMARY CARE DOCTOR FOR FURTHER MANAGEMENT OF LEG PAIN SINCE FALL IN LATE January. RETURN HERE AS NEEDED.

## 2011-08-31 NOTE — ED Provider Notes (Signed)
Medical screening examination/treatment/procedure(s) were performed by non-physician practitioner and as supervising physician I was immediately available for consultation/collaboration.   Loren Racer, MD 08/31/11 1538

## 2011-10-18 ENCOUNTER — Encounter (HOSPITAL_COMMUNITY): Payer: Self-pay | Admitting: Emergency Medicine

## 2011-10-18 ENCOUNTER — Emergency Department (HOSPITAL_COMMUNITY)
Admission: EM | Admit: 2011-10-18 | Discharge: 2011-10-18 | Disposition: A | Discharge status: Home / Self Care | Payer: Self-pay | Attending: Emergency Medicine | Admitting: Emergency Medicine

## 2011-10-18 DIAGNOSIS — M25561 Pain in right knee: Secondary | ICD-10-CM

## 2011-10-18 DIAGNOSIS — M25569 Pain in unspecified knee: Secondary | ICD-10-CM | POA: Insufficient documentation

## 2011-10-18 MED ORDER — HYDROCODONE-ACETAMINOPHEN 7.5-500 MG PO TABS: 1.0000 | ORAL_TABLET | Freq: Four times a day (QID) | ORAL | Status: AC | PRN

## 2011-10-18 MED ORDER — NAPROXEN 375 MG PO TABS: 375.0000 mg | ORAL_TABLET | Freq: Two times a day (BID) | ORAL | Status: DC

## 2011-10-18 MED ORDER — IBUPROFEN 800 MG PO TABS
800.0000 mg | ORAL_TABLET | Freq: Once | ORAL | Status: AC
  Administered 2011-10-18: 800 mg via ORAL
  Filled 2011-10-18: qty 1

## 2011-10-18 NOTE — ED Provider Notes (Signed)
History     CSN: [id number]  Arrival date & time 10/18/11  4098   First MD Initiated Contact with Patient 10/18/11 [phone]      Chief Complaint  Patient presents with  . Leg Pain    (Consider location/radiation/quality/duration/timing/severity/associated sxs/prior treatment) HPI  Patient presents to emergency department complaining of a 2-3 month history of bilateral knee pain with radiation of pain into distal thighs. Patient states that in January of 2013 she fell and sprained her right knee. Patient states that since then she's been having waxing and waning pain in her right knee. Patient states that about a month later she started having left knee pain stating "because my right knee hurts I put a lot of weight on the left." Patient states that she has seen her primary care provider for this numerous times and was prescribed Vicodin in the past. Patient states that "I don't like my Vicodin, but did take one of my husbands that was 7.5-500 and that seemed to work better. I think they gave me some generic stuff." Patient has been taking nothing else for her pain at home. Patient states she has no known medical problems and takes no medicines on regular basis. Patient denies any changing in the pain but states ongoing same pain for the last 2-3 months. Patient denies any redness, swelling, or skin changes of her knees or thighs. Patient denies any pain in her hips, tabs, or ankles. Patient states pain is aggravated by prolonged walking and standing. She states improved with rest.  History reviewed. No pertinent past medical history.  History reviewed. No pertinent past surgical history.  No family history on file.  History  Substance Use Topics  . Smoking status: Never Smoker   . Smokeless tobacco: Not on file  . Alcohol Use: 1.2 oz/week    2 Glasses of wine per week     occassional    OB History    Grav Para Term Preterm Abortions TAB SAB Ect Mult Living                  Review  of Systems  All other systems reviewed and are negative.    Allergies  Review of patient's allergies indicates no known allergies.  Home Medications   Current Outpatient Rx  Name Route Sig Dispense Refill  . ASPIRIN EC 81 MG PO TBEC Oral Take 81 mg by mouth daily as needed. For leg pain    . NAPROXEN SODIUM 220 MG PO TABS Oral Take 400 mg by mouth daily as needed. For leg pain    . HYDROCODONE-ACETAMINOPHEN 7.5-500 MG PO TABS Oral Take 1 tablet by mouth every 6 (six) hours as needed for pain. 20 tablet 0  . NAPROXEN 375 MG PO TABS Oral Take 1 tablet (375 mg total) by mouth 2 (two) times daily. 20 tablet 0    BP 123/90  Pulse 82  Temp(Src) 97.6 F (36.4 C) (Oral)  Resp 16  SpO2 100%  Physical Exam  Nursing note and vitals reviewed. Constitutional: She is oriented to person, place, and time. She appears well-developed and well-nourished. No distress.  HENT:  Head: Normocephalic and atraumatic.  Eyes: Conjunctivae are normal.  Neck: Normal range of motion. Neck supple.  Cardiovascular: Normal rate, regular rhythm, normal heart sounds and intact distal pulses.  Exam reveals no gallop and no friction rub.   No murmur heard. Pulmonary/Chest: Effort normal and breath sounds normal. No respiratory distress. She has no wheezes. She has no  rales. She exhibits no tenderness.  Abdominal: Soft. Bowel sounds are normal. She exhibits no distension and no mass. There is no tenderness. There is no rebound and no guarding.  Musculoskeletal: Normal range of motion. She exhibits tenderness. She exhibits no edema.       Tenderness to palpation of bilateral knees without skin changes, erythema, swelling, or heat. Full range of motion of bilateral knees with mild to moderate pain but no crepitus. No tenderness to palpation of bilateral thighs or calves. No swelling of lower extremities. Full range of motion of bilateral hips without pain.  Neurological: She is alert and oriented to person, place, and  time. She has normal reflexes.  Skin: Skin is warm and dry. No rash noted. She is not diaphoretic. No erythema.  Psychiatric: She has a normal mood and affect.    ED Course  Procedures (including critical care time)  By mouth ibuprofen  Labs Reviewed - No data to display No results found.   1. Knee pain, bilateral       MDM  Patient has a 2-3 month history of  ongoing right and left knee pain following injury to right knee with radiation of pain into distal thighs but no signs or symptoms of septic joint, DVT, or hip problems. Patient states pain is the same pain she's been having for months on and off that she has seen her primary care provider for. I spoke at length with patient about changing or worsening of symptoms that should prompt immediate return to emergency department however followup with primary care provider for ongoing evaluation of the pain.        Lenon Oms Parker, [place] 10/18/11 [phone]

## 2011-10-18 NOTE — Discharge Instructions (Signed)
Is important to take Naprosyn as directed for inflammation and pain using Vicodin for breakthrough pain. Do not drive or operate machinery with Vicodin use. You may need an over-the-counter stool softener with Vicodin use. It is also very important to followup with her primary care provider closely for further evaluation and management of ongoing knee and thigh pain. Return to the emergency department for any changing or worsening symptoms as discussed.  Arthralgia Your caregiver has diagnosed you as suffering from an arthralgia. Arthralgia means there is pain in a joint. This can come from many reasons including:  Bruising the joint which causes soreness (inflammation) in the joint.   Wear and tear on the joints which occur as we grow older (osteoarthritis).   Overusing the joint.   Various forms of arthritis.   Infections of the joint.  Regardless of the cause of pain in your joint, most of these different pains respond to anti-inflammatory drugs and rest. The exception to this is when a joint is infected, and these cases are treated with antibiotics, if it is a bacterial infection. HOME CARE INSTRUCTIONS   Rest the injured area for as long as directed by your caregiver. Then slowly start using the joint as directed by your caregiver and as the pain allows. Crutches as directed may be useful if the ankles, knees or hips are involved. If the knee was splinted or casted, continue use and care as directed. If an stretchy or elastic wrapping bandage has been applied today, it should be removed and re-applied every 3 to 4 hours. It should not be applied tightly, but firmly enough to keep swelling down. Watch toes and feet for swelling, bluish discoloration, coldness, numbness or excessive pain. If any of these problems (symptoms) occur, remove the ace bandage and re-apply more loosely. If these symptoms persist, contact your caregiver or return to this location.   For the first 24 hours, keep the  injured extremity elevated on pillows while lying down.   Apply ice for 15 to 20 minutes to the sore joint every couple hours while awake for the first half day. Then 3 to 4 times per day for the first 48 hours. Put the ice in a plastic bag and place a towel between the bag of ice and your skin.   Wear any splinting, casting, elastic bandage applications, or slings as instructed.   Only take over-the-counter or prescription medicines for pain, discomfort, or fever as directed by your caregiver. Do not use aspirin immediately after the injury unless instructed by your physician. Aspirin can cause increased bleeding and bruising of the tissues.   If you were given crutches, continue to use them as instructed and do not resume weight bearing on the sore joint until instructed.  Persistent pain and inability to use the sore joint as directed for more than 2 to 3 days are warning signs indicating that you should see a caregiver for a follow-up visit as soon as possible. Initially, a hairline fracture (break in bone) may not be evident on X-rays. Persistent pain and swelling indicate that further evaluation, non-weight bearing or use of the joint (use of crutches or slings as instructed), or further X-rays are indicated. X-rays may sometimes not show a small fracture until a week or 10 days later. Make a follow-up appointment with your own caregiver or one to whom we have referred you. A radiologist (specialist in reading X-rays) may read your X-rays. Make sure you know how you are to obtain your  X-ray results. Do not assume everything is normal if you do not hear from [place]. SEEK MEDICAL CARE IF: Bruising, swelling, or pain increases. SEEK IMMEDIATE MEDICAL CARE IF:   Your fingers or toes are numb or blue.   The pain is not responding to medications and continues to stay the same or get worse.   The pain in your joint becomes severe.   You develop a fever over 102 F (38.9 C).   It becomes impossible  to move or use the joint.  MAKE SURE YOU:   Understand these instructions.   Will watch your condition.   Will get help right away if you are not doing well or get worse.  Document Released: 06/09/2005 Document Revised: 05/29/2011 Document Reviewed: 01/26/2008 [facility] Patient Information 2012 [place], [place].

## 2011-10-18 NOTE — ED Provider Notes (Signed)
Medical screening examination/treatment/procedure(s) were performed by non-physician practitioner and as supervising physician I was immediately available for consultation/collaboration.  Juliet Rude. Rubin Payor, MD 10/18/11 [phone]

## 2011-10-18 NOTE — ED Notes (Signed)
Pt. Stated, I've had both legs to be in pain for 2 months.

## 2012-01-09 ENCOUNTER — Encounter (HOSPITAL_COMMUNITY): Payer: Self-pay | Admitting: *Deleted

## 2012-01-09 DIAGNOSIS — K219 Gastro-esophageal reflux disease without esophagitis: Secondary | ICD-10-CM | POA: Insufficient documentation

## 2012-01-09 DIAGNOSIS — M1710 Unilateral primary osteoarthritis, unspecified knee: Secondary | ICD-10-CM | POA: Insufficient documentation

## 2012-01-09 LAB — CBC WITH DIFFERENTIAL/PLATELET
Basophils Absolute: 0 10*3/uL (ref 0.0–0.1)
Basophils Relative: 0 % (ref 0–1)
Eosinophils Absolute: 0.2 10*3/uL (ref 0.0–0.7)
Eosinophils Relative: 2 % (ref 0–5)
MCH: 27.5 pg (ref 26.0–34.0)
MCHC: 33.5 g/dL (ref 30.0–36.0)
MCV: 82 fL (ref 78.0–100.0)
Platelets: 304 10*3/uL (ref 150–400)
RDW: 15.9 % — ABNORMAL HIGH (ref 11.5–15.5)
WBC: 11.7 10*3/uL — ABNORMAL HIGH (ref 4.0–10.5)

## 2012-01-09 LAB — COMPREHENSIVE METABOLIC PANEL
ALT: 27 U/L (ref 0–35)
AST: 28 U/L (ref 0–37)
Calcium: 9.8 mg/dL (ref 8.4–10.5)
Sodium: 142 mEq/L (ref 135–145)
Total Protein: 8.3 g/dL (ref 6.0–8.3)

## 2012-01-09 NOTE — ED Notes (Signed)
The pt has had central chest pain since this am with sob.  No resp distress

## 2012-01-10 ENCOUNTER — Emergency Department (HOSPITAL_COMMUNITY)
Admission: EM | Admit: 2012-01-10 | Discharge: 2012-01-10 | Disposition: A | Discharge status: Home / Self Care | Payer: Self-pay | Attending: Emergency Medicine | Admitting: Emergency Medicine

## 2012-01-10 ENCOUNTER — Emergency Department (HOSPITAL_COMMUNITY): Payer: Self-pay

## 2012-01-10 DIAGNOSIS — K219 Gastro-esophageal reflux disease without esophagitis: Secondary | ICD-10-CM

## 2012-01-10 DIAGNOSIS — M170 Bilateral primary osteoarthritis of knee: Secondary | ICD-10-CM

## 2012-01-10 LAB — TROPONIN I: Troponin I: <0.3 ng/mL (ref <0.30)

## 2012-01-10 MED ORDER — NITROGLYCERIN 0.4 MG SL SUBL
0.4000 mg | SUBLINGUAL_TABLET | SUBLINGUAL | Status: DC | PRN
  Administered 2012-01-10: 0.4 mg via SUBLINGUAL

## 2012-01-10 MED ORDER — GI COCKTAIL ~~LOC~~
30.0000 mL | Freq: Once | ORAL | Status: AC
  Administered 2012-01-10: 30 mL via ORAL
  Filled 2012-01-10: qty 30

## 2012-01-10 MED ORDER — MELOXICAM 7.5 MG PO TABS: 7.5000 mg | ORAL_TABLET | Freq: Two times a day (BID) | ORAL | Status: DC

## 2012-01-10 MED ORDER — ASPIRIN 81 MG PO CHEW
324.0000 mg | CHEWABLE_TABLET | Freq: Once | ORAL | Status: AC
  Administered 2012-01-10: 324 mg via ORAL
  Filled 2012-01-10: qty 4

## 2012-01-10 MED ORDER — SUCRALFATE 1 G PO TABS: 1.0000 g | ORAL_TABLET | Freq: Four times a day (QID) | ORAL | Status: DC

## 2012-01-10 NOTE — ED Notes (Signed)
Pt given discharge and follow up instructions after speaking with provider. Denies further needs at this time. Ambulates to lobby in NAD  

## 2012-01-10 NOTE — ED Provider Notes (Signed)
Medical screening examination/treatment/procedure(s) were conducted as a shared visit with non-physician practitioner(s) and myself.  I personally evaluated the patient during the encounter  Pt seen/examined EKG reviewed She had some reproducibility of CP on my exam Unlikely ACS/Dissection/PE at this time  Joya Gaskins, MD 01/10/12 [phone]

## 2012-01-10 NOTE — ED Provider Notes (Signed)
History     CSN: [id number]  Arrival date & time 01/09/12  2242   First MD Initiated Contact with Patient 01/10/12 0340      Chief Complaint  Patient presents with  . Chest Pain    (Consider location/radiation/quality/duration/timing/severity/associated sxs/prior treatment) HPI Comments: Patient states, that she's had chest pain since yesterday morning, which is persistent and not waxing and waning.  Is central in location slightly more on the right than left, not influenced by food position.  She states when she arrived in the emergency department earlier.  She felt slightly short of breath she does have a history of GERD she says this feels different.  She also is complaining of bilateral lower, leg, and knee pain that she has had for the past 6-8 months.  Her doctor is treating this with Vicodin, but she states she is just tired of taking pills, without having an answer  Patient is a 56 y.o. female presenting with chest pain. The history is provided by the patient.  Chest Pain The chest pain began 12 - 24 hours ago. Chest pain occurs constantly. The chest pain is unchanged. At its most intense, the pain is at 5/10. The pain is currently at 5/10. The severity of the pain is moderate. The quality of the pain is described as aching. The pain does not radiate. Pertinent negatives for primary symptoms include no fever, no shortness of breath, no cough, no palpitations, no abdominal pain, no nausea and no dizziness.  Pertinent negatives for associated symptoms include no numbness and no weakness. She tried nitroglycerin and proton pump inhibitors for the symptoms. Risk factors include obesity.     History reviewed. No pertinent past medical history.  History reviewed. No pertinent past surgical history.  No family history on file.  History  Substance Use Topics  . Smoking status: Never Smoker   . Smokeless tobacco: Not on file  . Alcohol Use: 1.2 oz/week    2 Glasses of wine per week       occassional    OB History    Grav Para Term Preterm Abortions TAB SAB Ect Mult Living                  Review of Systems  Constitutional: Negative for fever and chills.  HENT: Negative for congestion and rhinorrhea.   Respiratory: Negative for cough, chest tightness and shortness of breath.   Cardiovascular: Positive for chest pain. Negative for palpitations and leg swelling.  Gastrointestinal: Negative for nausea and abdominal pain.  Musculoskeletal: Negative for joint swelling.  Skin: Negative for rash and wound.  Neurological: Negative for dizziness, weakness and numbness.    Allergies  Review of patient's allergies indicates no known allergies.  Home Medications   Current Outpatient Rx  Name Route Sig Dispense Refill  . MELOXICAM 7.5 MG PO TABS Oral Take 1 tablet (7.5 mg total) by mouth 2 (two) times daily. 40 tablet 0  . SUCRALFATE 1 G PO TABS Oral Take 1 tablet (1 g total) by mouth 4 (four) times daily. 40 tablet 0    BP 101/74  Pulse 71  Temp 98.5 F (36.9 C) (Oral)  Resp 16  SpO2 99%  Physical Exam  Constitutional: She appears well-developed and well-nourished.       Obese  HENT:  Head: Normocephalic.  Eyes: Pupils are equal, round, and reactive to light.  Cardiovascular: Normal rate and regular rhythm.   Pulmonary/Chest: Effort normal and breath sounds normal. No respiratory distress.  She has no wheezes. She exhibits no tenderness.  Abdominal: Soft. She exhibits no distension. There is no tenderness.  Musculoskeletal: Normal range of motion. She exhibits tenderness. She exhibits no edema.       Bilateral legs are tender in the posterior knee area, and upper posterior calf.  Is no leg swelling, discoloration, palpable Baker's cysts, there is slight crepitus in both knees  Neurological: She is alert.  Skin: Skin is warm.    ED Course  Procedures (including critical care time)  Labs Reviewed  CBC WITH DIFFERENTIAL - Abnormal; Notable for the  following:    WBC 11.7 (*)     RDW 15.9 (*)     All other components within normal limits  COMPREHENSIVE METABOLIC PANEL - Abnormal; Notable for the following:    Glucose, Bld 119 (*)     Total Bilirubin 0.2 (*)     All other components within normal limits  TROPONIN I  TROPONIN I   Dg Chest 2 View  01/10/2012  *RADIOLOGY REPORT*  Clinical Data: Chest pain and shortness of breath.  CHEST - 2 VIEW  Comparison: 03/31/2011  Findings: The heart size and pulmonary vascularity are normal. The lungs appear clear and expanded without focal air space disease or consolidation. No blunting of the costophrenic angles.  No pneumothorax.  Azygos lobe the.  Tortuous aorta. The degenerative changes in the spine.  No significant change since previous study.  IMPRESSION: No evidence of active pulmonary disease.  Original Report Authenticated By: Marlon Pel, M.D.   Dg Knee 2 Views Left  01/10/2012  *RADIOLOGY REPORT*  Clinical Data: Bilateral knee pain and swelling.  No trauma.  LEFT KNEE - 1-2 VIEW  Comparison: 05/15/2005.  Findings: Old fracture deformity of the lateral tibial plateau and proximal fibula, similar to previous study.  Tricompartmental degenerative changes with joint space narrowing and hypertrophic changes.  Osteophytes arising from the tibial spines are stable since previous study.  No evidence of acute fracture or subluxation.  No significant changes since previous study.  IMPRESSION: Tricompartmental degenerative changes.  Old fracture deformities of the lateral tibial plateau and fibular head, stable since previous study.  Original Report Authenticated By: Marlon Pel, M.D.   Dg Knee 2 Views Right  01/10/2012  *RADIOLOGY REPORT*  Clinical Data: Bilateral knee pain and swelling.  No trauma.  RIGHT KNEE - 1-2 VIEW  Comparison: 07/19/2011  Findings: Tricompartmental narrowing and hypertrophic changes in the right knee.  Small bone fragment adjacent to the superior aspect of the medial  femoral condyle is new since the previous study and could represent avulsion fragment.  No displaced fractures identified.  No focal bone lesion or bone destruction. Bone cortex and trabecular architecture appear intact.  No significant effusion.  IMPRESSION: Degenerative changes in the right knee.  Small osseous fragment adjacent to the medial femoral condyle is new since the previous study and could represent an avulsion fragment.  Original Report Authenticated By: Marlon Pel, M.D.    ED ECG REPORT   Date: 01/10/2012  EKG Time: 5:25 AM  Rate:81  Rhythm: normal sinus rhythm,  unchanged from previous tracings  Axis: normal  Intervals:RSR'  ST&T Change: non specific ST an dT wave   Narrative Interpretation: abdnormal but unchanged for 02/16/2011           1. GERD   2. Osteoarthritis of both knees       MDM  I feel this is GERD excerebration, will treat with GI cocktail  as well as xray her knees  Labs and xrays reviewed   Arthritis in knees, chest normal and 2  Negative troponin's with an unchanged EKG           Arman Filter, NP 01/10/12 [phone]

## 2012-01-10 NOTE — ED Notes (Signed)
C/o throbbing CP, constant, no aggravating or aleviating factors, skin W&D, also reports back pain and leg pain, has had h/o leg pain, no changes, "just not getting better or going away", h/o acid reflux "does not feel the same". Alert, NAD, calm, interactive, resps e/u, speaking in clear complete setnences.

## 2012-01-10 NOTE — ED Notes (Signed)
Pt remains in radiology 

## 2012-01-10 NOTE — ED Notes (Signed)
Patient transported to X-ray 

## 2012-05-02 ENCOUNTER — Observation Stay (HOSPITAL_COMMUNITY)
Admission: EM | Admit: 2012-05-02 | Discharge: 2012-05-03 | Disposition: A | Discharge status: Home / Self Care | Payer: 59 | Attending: Internal Medicine | Admitting: Internal Medicine

## 2012-05-02 ENCOUNTER — Observation Stay (HOSPITAL_COMMUNITY): Payer: Self-pay

## 2012-05-02 ENCOUNTER — Encounter (HOSPITAL_COMMUNITY): Payer: Self-pay | Admitting: Emergency Medicine

## 2012-05-02 ENCOUNTER — Emergency Department (HOSPITAL_COMMUNITY): Payer: Self-pay

## 2012-05-02 DIAGNOSIS — M4722 Other spondylosis with radiculopathy, cervical region: Secondary | ICD-10-CM

## 2012-05-02 DIAGNOSIS — I712 Thoracic aortic aneurysm, without rupture, unspecified: Secondary | ICD-10-CM

## 2012-05-02 DIAGNOSIS — R0789 Other chest pain: Principal | ICD-10-CM

## 2012-05-02 DIAGNOSIS — K219 Gastro-esophageal reflux disease without esophagitis: Secondary | ICD-10-CM

## 2012-05-02 DIAGNOSIS — Z23 Encounter for immunization: Secondary | ICD-10-CM | POA: Insufficient documentation

## 2012-05-02 DIAGNOSIS — R079 Chest pain, unspecified: Secondary | ICD-10-CM

## 2012-05-02 DIAGNOSIS — M5030 Other cervical disc degeneration, unspecified cervical region: Secondary | ICD-10-CM | POA: Insufficient documentation

## 2012-05-02 DIAGNOSIS — M792 Neuralgia and neuritis, unspecified: Secondary | ICD-10-CM

## 2012-05-02 DIAGNOSIS — M47812 Spondylosis without myelopathy or radiculopathy, cervical region: Secondary | ICD-10-CM

## 2012-05-02 DIAGNOSIS — E669 Obesity, unspecified: Secondary | ICD-10-CM | POA: Insufficient documentation

## 2012-05-02 DIAGNOSIS — R0602 Shortness of breath: Secondary | ICD-10-CM | POA: Insufficient documentation

## 2012-05-02 HISTORY — DX: Sleep apnea, unspecified: G47.30

## 2012-05-02 HISTORY — DX: Chest pain, unspecified: R07.9

## 2012-05-02 HISTORY — DX: Unspecified osteoarthritis, unspecified site: M19.90

## 2012-05-02 HISTORY — DX: Gastro-esophageal reflux disease without esophagitis: K21.9

## 2012-05-02 LAB — CBC
HCT: 39.5 % (ref 36.0–46.0)
Platelets: 284 10*3/uL (ref 150–400)
RDW: 15.3 % (ref 11.5–15.5)
WBC: 10.8 10*3/uL — ABNORMAL HIGH (ref 4.0–10.5)

## 2012-05-02 LAB — COMPREHENSIVE METABOLIC PANEL
ALT: 22 U/L (ref 0–35)
AST: 19 U/L (ref 0–37)
Albumin: 3.3 g/dL — ABNORMAL LOW (ref 3.5–5.2)
Alkaline Phosphatase: 82 U/L (ref 39–117)
BUN: 14 mg/dL (ref 6–23)
Chloride: 104 mEq/L (ref 96–112)
Potassium: 3.9 mEq/L (ref 3.5–5.1)
Total Bilirubin: 0.2 mg/dL — ABNORMAL LOW (ref 0.3–1.2)

## 2012-05-02 LAB — MRSA PCR SCREENING: MRSA by PCR: NEGATIVE

## 2012-05-02 LAB — LIPID PANEL
LDL Cholesterol: 105 mg/dL — ABNORMAL HIGH (ref 0–99)
Total CHOL/HDL Ratio: 2.9 RATIO
VLDL: 21 mg/dL (ref 0–40)

## 2012-05-02 LAB — HEMOGLOBIN A1C
Hgb A1c MFr Bld: 5.8 % — ABNORMAL HIGH (ref <5.7)
Mean Plasma Glucose: 120 mg/dL — ABNORMAL HIGH (ref <117)

## 2012-05-02 LAB — CK TOTAL AND CKMB (NOT AT ARMC): Relative Index: INVALID (ref 0.0–2.5)

## 2012-05-02 LAB — PRO B NATRIURETIC PEPTIDE: Pro B Natriuretic peptide (BNP): 15.8 pg/mL (ref 0–125)

## 2012-05-02 MED ORDER — MORPHINE SULFATE 4 MG/ML IJ SOLN
4.0000 mg | Freq: Once | INTRAMUSCULAR | Status: AC
  Administered 2012-05-02: 4 mg via INTRAVENOUS
  Filled 2012-05-02: qty 1

## 2012-05-02 MED ORDER — ASPIRIN 81 MG PO CHEW
324.0000 mg | CHEWABLE_TABLET | Freq: Once | ORAL | Status: AC
  Administered 2012-05-02: 324 mg via ORAL
  Filled 2012-05-02: qty 4

## 2012-05-02 MED ORDER — OXYCODONE-ACETAMINOPHEN 5-325 MG PO TABS
1.0000 | ORAL_TABLET | ORAL | Status: DC | PRN
  Administered 2012-05-02: 2 via ORAL
  Administered 2012-05-03: 1 via ORAL
  Administered 2012-05-03: 2 via ORAL
  Filled 2012-05-02: qty 2
  Filled 2012-05-02: qty 1
  Filled 2012-05-02: qty 2

## 2012-05-02 MED ORDER — SODIUM CHLORIDE 0.9 % IJ SOLN
3.0000 mL | Freq: Two times a day (BID) | INTRAMUSCULAR | Status: DC
  Administered 2012-05-02: 3 mL via INTRAVENOUS

## 2012-05-02 MED ORDER — ACETAMINOPHEN 325 MG PO TABS
650.0000 mg | ORAL_TABLET | Freq: Once | ORAL | Status: AC
  Administered 2012-05-02: 650 mg via ORAL
  Filled 2012-05-02: qty 2

## 2012-05-02 MED ORDER — ENOXAPARIN SODIUM 40 MG/0.4ML ~~LOC~~ SOLN
40.0000 mg | SUBCUTANEOUS | Status: DC
  Filled 2012-05-02 (×2): qty 0.4

## 2012-05-02 MED ORDER — PANTOPRAZOLE SODIUM 40 MG PO TBEC
40.0000 mg | DELAYED_RELEASE_TABLET | Freq: Every day | ORAL | Status: DC
  Administered 2012-05-03: 40 mg via ORAL
  Filled 2012-05-02: qty 1

## 2012-05-02 MED ORDER — IOHEXOL 350 MG/ML SOLN
100.0000 mL | Freq: Once | INTRAVENOUS | Status: AC | PRN
  Administered 2012-05-02: 100 mL via INTRAVENOUS

## 2012-05-02 MED ORDER — METOPROLOL TARTRATE 12.5 MG HALF TABLET
12.5000 mg | ORAL_TABLET | Freq: Two times a day (BID) | ORAL | Status: DC
  Administered 2012-05-02 – 2012-05-03 (×2): 12.5 mg via ORAL
  Filled 2012-05-02 (×3): qty 1

## 2012-05-02 MED ORDER — INFLUENZA VIRUS VACC SPLIT PF IM SUSP
0.5000 mL | INTRAMUSCULAR | Status: AC
  Administered 2012-05-03: 0.5 mL via INTRAMUSCULAR
  Filled 2012-05-02: qty 0.5

## 2012-05-02 MED ORDER — SODIUM CHLORIDE 0.9 % IV SOLN: INTRAVENOUS | Status: DC

## 2012-05-02 MED ORDER — IBUPROFEN 800 MG PO TABS
800.0000 mg | ORAL_TABLET | ORAL | Status: DC
  Administered 2012-05-02: 800 mg via ORAL
  Filled 2012-05-02 (×5): qty 1

## 2012-05-02 MED ORDER — SODIUM CHLORIDE 0.9 % IV SOLN
Freq: Once | INTRAVENOUS | Status: AC
  Administered 2012-05-02: 08:00:00 via INTRAVENOUS

## 2012-05-02 MED ORDER — ASPIRIN EC 81 MG PO TBEC
81.0000 mg | DELAYED_RELEASE_TABLET | Freq: Every day | ORAL | Status: DC
  Administered 2012-05-03: 81 mg via ORAL
  Filled 2012-05-02 (×2): qty 1

## 2012-05-02 MED ORDER — POLYETHYLENE GLYCOL 3350 17 G PO PACK
17.0000 g | PACK | Freq: Every day | ORAL | Status: DC | PRN
  Filled 2012-05-02: qty 1

## 2012-05-02 MED ORDER — IBUPROFEN 800 MG PO TABS
800.0000 mg | ORAL_TABLET | Freq: Four times a day (QID) | ORAL | Status: DC
  Administered 2012-05-02 – 2012-05-03 (×3): 800 mg via ORAL
  Filled 2012-05-02 (×7): qty 1

## 2012-05-02 MED ORDER — NITROGLYCERIN 0.4 MG SL SUBL
0.4000 mg | SUBLINGUAL_TABLET | SUBLINGUAL | Status: DC | PRN
  Administered 2012-05-02 (×3): 0.4 mg via SUBLINGUAL

## 2012-05-02 MED ORDER — MORPHINE SULFATE 4 MG/ML IJ SOLN
2.0000 mg | Freq: Once | INTRAMUSCULAR | Status: AC
  Administered 2012-05-02: 2 mg via INTRAVENOUS
  Filled 2012-05-02: qty 1

## 2012-05-02 MED ORDER — DIAZEPAM 5 MG/ML IJ SOLN
5.0000 mg | Freq: Once | INTRAMUSCULAR | Status: AC
  Administered 2012-05-02: 5 mg via INTRAVENOUS
  Filled 2012-05-02: qty 2

## 2012-05-02 NOTE — ED Provider Notes (Signed)
7:21 AM Patient is in CDU holding.  Sign out received from Dr Lorenso Courier.  Patient with chest pain and SOB.  Pt is overweight and is unable to get coronary CT, and as it is the weekend, she is unable to do the stress echo.  Plan is for troponins x 3 (total) and referral to cardiology outpatient.  If patient has persistent pain or recurrent pain, plan will be for admission for chest pain rule out.  Patient also found to have 4.2cm thoracic aortic aneurysm.  Will consult vascular surgery.    8:06 AM Patient reports persistent 10/10 chest pain, "like a ton of bricks on my chest"  Discussed all results thus far with patient.  Will call for admission.    8:56 AM Patient admitted to Outpatient Clinics as an unassigned admission.  [facility] resident informed of results thus far including thoracic aortic aneurysm.     Filed Vitals:   05/02/12 0750  BP: 103/71  Pulse:   Temp: 98.2 F (36.8 C)  Resp: 18     Results for orders placed during the hospital encounter of 05/02/12  CBC      Component Value Range   WBC 10.8 (*) 4.0 - 10.5 K/uL   RBC 4.78  3.87 - 5.11 MIL/uL   Hemoglobin 12.9  12.0 - 15.0 g/dL   HCT 40.9  81.1 - 91.4 %   MCV 82.6  78.0 - 100.0 fL   MCH 27.0  26.0 - 34.0 pg   MCHC 32.7  30.0 - 36.0 g/dL   RDW 78.2  95.6 - 21.3 %   Platelets 284  150 - 400 K/uL  COMPREHENSIVE METABOLIC PANEL      Component Value Range   Sodium 139  135 - 145 mEq/L   Potassium 3.9  3.5 - 5.1 mEq/L   Chloride 104  96 - 112 mEq/L   CO2 25  19 - 32 mEq/L   Glucose, Bld 93  70 - 99 mg/dL   BUN 14  6 - 23 mg/dL   Creatinine, Ser 0.86  0.50 - 1.10 mg/dL   Calcium 9.2  8.4 - 57.8 mg/dL   Total Protein 7.3  6.0 - 8.3 g/dL   Albumin 3.3 (*) 3.5 - 5.2 g/dL   AST 19  0 - 37 U/L   ALT 22  0 - 35 U/L   Alkaline Phosphatase 82  39 - 117 U/L   Total Bilirubin 0.2 (*) 0.3 - 1.2 mg/dL   GFR calc non Af Amer >90  >90 mL/min   GFR calc Af Amer >90  >90 mL/min  TROPONIN I      Component Value Range   Troponin I <0.30   <0.30 ng/mL   Dg Chest 2 View  05/02/2012  *RADIOLOGY REPORT*  Clinical Data: Left arm pain for 3 weeks.  CHEST - 2 VIEW  Comparison: Chest radiograph performed 01/10/2012  Findings: The lungs are well-aerated.  Pulmonary vascularity is at the upper limits of normal.  There is no evidence of focal opacification, pleural effusion or pneumothorax.  The heart is borderline normal in size; the mediastinal contour is within normal limits.  No acute osseous abnormalities are seen.  IMPRESSION: No acute cardiopulmonary process seen.   Original Report Authenticated By: Tonia Ghent, M.D.    Ct Angio Chest Pe W/cm &/or Wo Cm  05/02/2012  *RADIOLOGY REPORT*  Clinical Data: Sharp chest pain; shortness of breath.  Right arm pain.  CT ANGIOGRAPHY CHEST  Technique:  Multidetector CT  imaging of the chest using the standard protocol during bolus administration of intravenous contrast. Multiplanar reconstructed images including MIPs were obtained and reviewed to evaluate the vascular anatomy.  Contrast: OMNIPAQUE IOHEXOL 350 MG/ML SOLN  Comparison: Chest radiograph performed earlier today at 04:23 a.m., and CTA of the chest performed 06/13/2009  Findings: There is no evidence of pulmonary embolus.  Minimal bibasilar atelectasis is noted.  An accessory azygos lobe is seen.  There is no evidence of significant focal consolidation, pleural effusion or pneumothorax.  No masses are identified; no abnormal focal contrast enhancement is seen.  There is mild aneurysmal dilatation of the ascending thoracic aorta to 4.2 cm in maximal AP dimension.  The mediastinum is otherwise unremarkable in appearance.  No mediastinal lymphadenopathy is seen.  No pericardial effusion is identified.  The great vessels are grossly unremarkable in appearance.  No axillary lymphadenopathy is seen.  The visualized portions of the thyroid gland are unremarkable in appearance.  The visualized portions of the liver and spleen are unremarkable. The  visualized portions of the pancreas, gallbladder, stomach, adrenal glands and kidneys are within normal limits.  No acute osseous abnormalities are seen.  There is partial fusion of vertebral bodies T9-T11.  IMPRESSION:  1.  No evidence of pulmonary embolus. 2.  Minimal bibasilar atelectasis noted; accessory azygos lobe seen. 3.  Mild aneurysmal dilatation of the ascending thoracic aorta to 4.2 cm in maximal AP dimension. 4.  Partial fusion of vertebral bodies T9-T11.   Original Report Authenticated By: Tonia Ghent, M.D.       [place], [place] 05/02/12 1558

## 2012-05-02 NOTE — ED Provider Notes (Addendum)
History     CSN: [id number]  Arrival date & time 05/02/12  1308   First MD Initiated Contact with Patient 05/02/12 0341      Chief Complaint  Patient presents with  . Chest Pain    (Consider location/radiation/quality/duration/timing/severity/associated sxs/prior treatment) HPI Comments: Michele Gross presents oro evaluation of chest pain.  While laying in bed tonight she states she had acute onset sharp left-sided chest pressure and discomfort that was associated with shortness of breath.  She denies any recent travel or leg pain/swelling.  She also denies fever, cough, SOB, palpitations, abd pain, NVD, or previous similar episodes of pain.  This discomfort is non radiating.  Other than obesity, she denies risk factors for coronary artery disease.  She reports for upwards of 3 weeks, she has had aching in her left arm.  She reports she has left-sided neck pain and soreness that radiates down into the left arm causing tingling and a sore feeling.  She denies any trauma or repetitive lifting or activities.  She denies any loss of sensation in her hand or weakness in the extremity or in grip strength.  The history is provided by the patient. The history is limited by a language barrier. No language interpreter was used.    History reviewed. No pertinent past medical history.  History reviewed. No pertinent past surgical history.  No family history on file.  History  Substance Use Topics  . Smoking status: Never Smoker   . Smokeless tobacco: Not on file  . Alcohol Use: 1.2 oz/week    2 Glasses of wine per week     Comment: occassional    OB History    Grav Para Term Preterm Abortions TAB SAB Ect Mult Living                  Review of Systems  Constitutional: Negative for fever, diaphoresis, activity change, appetite change and fatigue.  HENT: Positive for neck pain and neck stiffness. Negative for congestion, sore throat, facial swelling, rhinorrhea, trouble swallowing and  sinus pressure.   Eyes: Negative.   Respiratory: Positive for chest tightness and shortness of breath. Negative for cough and wheezing.   Cardiovascular: Positive for chest pain. Negative for palpitations and leg swelling.  Gastrointestinal: Negative for nausea, vomiting, abdominal pain, diarrhea and abdominal distention.  Genitourinary: Negative.   Musculoskeletal: Negative for myalgias, back pain, joint swelling, arthralgias and gait problem.  Neurological: Negative for dizziness, syncope, weakness, light-headedness, numbness and headaches.  Psychiatric/Behavioral: Negative.     Allergies  Review of patient's allergies indicates no known allergies.  Home Medications   Current Outpatient Rx  Name  Route  Sig  Dispense  Refill  . MELOXICAM 7.5 MG PO TABS   Oral   Take 1 tablet (7.5 mg total) by mouth 2 (two) times daily.   40 tablet   0   . SUCRALFATE 1 G PO TABS   Oral   Take 1 tablet (1 g total) by mouth 4 (four) times daily.   40 tablet   0     BP 122/75  Pulse 85  Temp 98.1 F (36.7 C) (Oral)  Resp 18  SpO2 98%  Physical Exam  Nursing note and vitals reviewed. Constitutional: She is oriented to person, place, and time. She appears well-developed and well-nourished. No distress.  HENT:  Head: Normocephalic and atraumatic.  Right Ear: External ear normal.  Left Ear: External ear normal.  Nose: Nose normal.  Mouth/Throat: Oropharynx is clear and  moist. No oropharyngeal exudate.  Eyes: Conjunctivae normal are normal. Pupils are equal, round, and reactive to light. Right eye exhibits no discharge. Left eye exhibits no discharge. No scleral icterus.  Neck: Trachea normal, normal range of motion and phonation normal. Neck supple. No JVD present. Muscular tenderness present. No tracheal tenderness and no spinous process tenderness present. No rigidity. No tracheal deviation, no edema, no erythema and normal range of motion present. No mass present.  Cardiovascular:  Normal rate, regular rhythm, S1 normal, S2 normal, intact distal pulses and normal pulses.  PMI is not displaced.  Exam reveals distant heart sounds. Exam reveals no gallop, no friction rub and no decreased pulses.   No murmur heard. Pulmonary/Chest: Effort normal and breath sounds normal. No stridor. No respiratory distress. She has no wheezes. She has no rales. She exhibits no tenderness.  Abdominal: Soft. Bowel sounds are normal. She exhibits no distension. There is no tenderness. There is no rebound and no guarding.       Obese, protuberant   Musculoskeletal: Normal range of motion. She exhibits no edema and no tenderness.  Lymphadenopathy:    She has no cervical adenopathy.  Neurological: She is alert and oriented to person, place, and time. No cranial nerve deficit. Coordination normal.       Note intact and symmetric grip strength  Skin: Skin is warm and dry. No rash noted. She is not diaphoretic. No erythema.  Psychiatric: She has a normal mood and affect.    ED Course  Procedures (including critical care time)  Labs Reviewed  CBC - Abnormal; Notable for the following:    WBC 10.8 (*)     All other components within normal limits  COMPREHENSIVE METABOLIC PANEL  TROPONIN I   No results found.   No diagnosis found.   Date: 05/02/2012  Rate: 84 bpm  Rhythm: sinus  QRS Axis: normal  Intervals: normal  ST/T Wave abnormalities: normal  Conduction Disutrbances:borderline repol abnormality  Narrative Interpretation:   Old EKG Reviewed: unchanged      MDM  Pt presents for evaluation of chest discomfort.  She reports sharp left-sided pain associated with shortness of breath.  Note stable VS, NAD.  Plan initiate a chest pain eval including EKG, basic labs, CXR, and trop.  Will administer PO aspirin also.  For greater than a week prior to tonight's episode of chest pain, she has had left-sided neck soreness and pain/tingling in her left arm.  She denies any trauma or heavy  lifting.  These appear to be 2 separate issues.  She describes a radicular pain that is distinctly different from the sharp pain that prompted her to come to the ER.  Plan symptomatic care as she does not have any deficits on exam.  0535.  Note no evidence of acute ischemia on her EKG.  Trop is negative x1.  Metabolic and CBC are also essentially normal.  Pt denies risk factors for CAD/angina.  By history and evaluation so far she falls into a low risk category for chest pain.  Secondary to the mild dyspnea, a PE protocol CT of the chest has been ordered.  Will move to the CDU for serial markers and an echocardiogram.  Anticipate she will eventually be discharged home.  1610.  Pt stable, NAD.  C/o chest pain.  Repeat EKG demonstrates no evidence of acute ischemia.  She has been moved to the CDU unde the chest pain protocol.      Tobin Chad, MD 05/02/12  1610  Tobin Chad, MD 05/02/12 [phone]

## 2012-05-02 NOTE — H&P (Signed)
Resident Addendum to Medical Student H&P Note   I have seen and examined the patient, and agree with the the medical student assessment and plan outlined above. Please see my separate H&P for additional details.    Length of Stay: 0   Kristie Cowman, MD PGY2, Internal Medicine Resident 05/02/2012, 6:36 PM

## 2012-05-02 NOTE — ED Notes (Signed)
Pt denies sob, dizziness while ambulating to the restroom and back to the room/ pt is c/o mid central chest pain radiating to her left arm at a pain level of 10

## 2012-05-02 NOTE — ED Notes (Signed)
Received report from Jody,RN pt is currently in CT/ Jody,RN informed that she would place patients belongings in CDU rm 1

## 2012-05-02 NOTE — ED Notes (Signed)
Pt c/o sharp chest pain starting tonight. Also states that her right arm has been hurting for approx 3 weeks. Pt also reports sob. Denies diaphoresis, nv, dizziness. Pt is a&ox4 no acute distress at this time.

## 2012-05-02 NOTE — H&P (Signed)
Internal Medicine Teaching Service Resident Admission Note Date: 05/02/2012  Patient name: Michele Gross Medical record number: [id number] Date of birth: 01-25-1956 Age: 56 y.o. Gender: female PCP: Burtis Junes, MD  Medical Service:  I have reviewed the note by Randal Buba MS 4 and was present during the interview and physical exam.  Please see below for findings, assessment, and plan.  Chief Complaint: chest pain  History of Present Illness: Michele Gross is a 56 yo female with past medical history significant for gastroesophageal reflux disease, osteoarthritis, and recurrent chest pain with negative prior cardiac workup including  EKGs w/o ischemic changes, negative cardiac enzymes,  normal Exercise Stress ECHO 04/24/2009 who presents to the ED with complaints of sharp chest pain that has been unrelenting since yesterday.  Pain increases when she reclines especially on left side.  In addition she states that she has been having neck and left arm pain and numbness.  Pt states that the pain has been ongoing consistently without alleviation and without radiation to back or jaw, no diaphoresis, no nausea, no palpitations but occasional shortness of breath. She reports paroxysmal nocturnal orthopnea and sleeping on 3 pillows. In ED she received Nitroglycerin which did not relieve her pain.  Morphine was administered in ED which pt reports "took the edge off".   Meds:  (Not in a hospital admission)  Allergies: Allergies as of 05/02/2012  . (No Known Allergies)    Past Medical History: Medical Student note reviewed  Family History: Medical Student note reviewed  Social History: Medical Student note reviewed  Surgical History: Medical Student note reviewed  Review of System: Medical Student note reviewed  Physical Exam: Blood pressure 104/62, pulse 67, temperature 98.2 F (36.8 C), temperature source Oral, resp. rate 20, SpO2 97.00%. General: Obese, AA female,  Well-developed, well-nourished, in mild distress intermittantly during the exam Head: Normocephalic, atraumatic. Eyes: PERRLA, EOMI, murky sclera Neck: supple, no masses, no carotid Bruits, no JVD appreciated. Lungs: Normal respiratory effort. Clear to auscultation bilaterally from apices to bases without crackles or wheezes appreciated. Heart: normal rate, regular rhythm, normal S1 and S2, no gallop, murmur, or rubs appreciated. Abdomen: BS normoactive. Soft, Nondistended, non-tender. No masses or organomegaly appreciated. Extremities: decreased ROM of left UE notable for decreased abduction at glenohumeral jt and pain elicited on passive motion of left UE, decreased left lateral rotation of the neck and pain in left arm elicited on full extension and flexion of the neck, positive Neers, trace pretibial edema, distal pulses intact Neurologic: grossly non-focal, alert and oriented x3, appropriate and cooperative throughout examination.   Labs: Reviewed as noted in the Electronic Record  Imaging: Reviewed as noted in the Electronic Record  Assessment & Plan by Problem: 56 yo female without known CAD admitted with atypical chest pain of 3 weeks during.  1. Chest Pain, atypical: Pt without typical anginal symptoms given long ongoing discomfort for hours on end although given her history of hyperlipidemia and prior ECHO with mild-moderate left ventricular wall thickness CAD is of concern.  MI less likely given negative cardiac enzymes, no ischemic changes on EKG, and continued pain with SL NTG. She has a low TIMI Risk Index for intra-hospital mortality given her age, heart rate, and systolic blood pressure. Pericarditis is suggestive given her increased discomfort when reclining on exam and retrosternal chest pain with trapezius ridge, neck, left shoulder and arm pain.   There is no evidence of friction rub on physical exam and EKG without associated ST elevations and PR  depressions.  Given her  clinical picture we will start a trial of high dose ibuprofen and monitor her symptoms.  Although mild thoracic aneurysm is demonstrated on CT of chest, there are no signs of dissection on imaging and pt has equal bp on bilateral upper extremities. An repeat 2D ECHO will be useful to rule out cardiomyopathy, pulmonary hypertension or valvular issues especially given her reports of PND although her proBNP is not elevated.  Pulmonary Embolism is unlikely given her low Well's Score for PE and no evidence on CT Angio of chest. Chest XRay is without evidence of pneumonia, pneumothorax. She does endorse reflux symptoms thus this could be a component of her complaints.  No sign of esophageal rupture on CTA and pt without previous GI procedures.  Her cervical and thoracic disc disease is likely the biggest contributor to her pain.  -continue to cycle cardiac enzymes -serial EKGs -2D ECHO -ibuprofen 800 mg q8h -XRay cervical spine -check lipase -CAD risk stratify with lipid panel, HgbA1c -Urine Drug Screen    Lab 05/02/12 1613 05/02/12 0406  TROPONINI <0.30 <0.30   2. 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  Likely progression of her pathology.  Reports pain and numbness but no overt weakness elicited on finger extension and hand grip.  She does have reduced cervical and glenohumeral range of motion.  -cont NSAIDs as noted above -will consider repeat MRI but defer for now -may need out pt referral to Neurosurgeon  3. Ascending Aortic Aneurysm,Thoracic: no evidence of dissection on CT Angio of chest. 4.2 cm thus not large enough to warrant repair at this point (indicated at >5.5cm).  Will have pt f/u Thoracic Surgeon Dr. Garen Grams as outpatient.  4. DVT ppx: Lovenox SQ   Signed: Jarrius Huaracha 05/02/2012, 2:17 PM

## 2012-05-02 NOTE — ED Notes (Signed)
Notified EDP Powers of patients active chest pain/ EDP gave telephone order to give patient po nitroglycerin 0.4mg  once to see if medication resolves patients pain

## 2012-05-02 NOTE — H&P (Signed)
Medical Student Hospital Admission Note Date: 05/02/2012  Patient name: Michele Gross Medical record number: [id number] Date of birth: 07/31/1955 Age: 56 y.o. Gender: female PCP: Burtis Junes, MD  Medical Service: IM Teaching Service  Attending physician:  Dr. Margarito Liner     Chief Complaint: Chest pain  History of Present Illness: Michele Gross is a 56 y/o woman with PMH significant for DJD of cervical and thoracic spine; OA of knees; GERD; multiple ED visits/hospitalizations for chest pain with negative work-up for ACS (last Echo 2008 with LVEF 60% and mild/moderate increased LV wall thickness; last stress test normal in 2010) presents with chest pain with left neck and arm pain. The patient reports pain along the left side of her neck, shoulder and arm started three weeks ago with no inciting event. The pain has been constant since with tingling in the fingers of her left hand, weakness in her left arm and hand, and inability to raise her arm above her head. Chest pain began yesterday at church and continued through the night. It is described as "a ton of bricks sitting on my chest." The substernal chest pain is sharp, 8/10, and constant since last night. The patient notes she has had chest pain in the past but this pain is different in its severity and persistence. She associates prior episodes of chest pain with GERD triggered by fatty meals. No alleviating or aggravating factors are reported. The patient denies any improvement in pain with sitting up/leaning forward, and denies pleuritic pain. She notes some dyspnea with the chest pain last night but that has since resolved. There is no radiation of the pain to her back although she has chronic back pain that is unchanged. She denies similar chest pain at rest or with exertion. She also notes PND daily for the past year with orthopnea requiring 3 pillows at night. She denies recent GERD symptoms and has not needed to take OTC  Prilosec in the past 6 months. She reports a headache only after she was given nitroglycerin, and some abdominal bloating. She denies any recent nausea, diaphoresis, sick contacts, fever/chills, cough, dysuria, leg swelling, recent travel, abdominal pain, N/V/D, constipation, joint pain, rashes, and vision changes. In the ED the patient was given nitroglycerin and four 81 mg aspirin. She was given morphine which temporarily alleviated her pain; however, the pain has since resumed.  Meds: Current Outpatient Rx  Name  Route  Sig  Dispense  Refill  . ACETAMINOPHEN 500 MG PO TABS   Oral   Take 1,000 mg by mouth every 6 (six) hours as needed. For pain         . DIPHENHYDRAMINE HCL 25 MG PO TABS   Oral   Take 25 mg by mouth every 6 (six) hours as needed. For itching           Allergies: Allergies as of 05/02/2012  . (No Known Allergies)   History reviewed. No pertinent past medical history. History reviewed. No pertinent past surgical history. No family history on file. History   Social History  . Marital Status: Married    Spouse Name: N/A    Number of Children: N/A  . Years of Education: N/A   Occupational History  . Not on file.   Social History Main Topics  . Smoking status: Never Smoker   . Smokeless tobacco: Not on file  . Alcohol Use: 1.2 oz/week    2 Glasses of wine per week     Comment: occassional  .  Drug Use: No  . Sexually Active:    Other Topics Concern  . Not on file   Social History Narrative  . No narrative on file   PMH 1. DJD of thoracic and cervical spine, MRI  2. OA of knees 3. GERD with hiatal hernia diagnosed on Upper Esophageal Series 2001  SURGERIES  Partial hysterectomy, 8 years ago  FAMILY HISTORY No FH of sudden cardiac death, CAD, HTN, DM2, aortic aneurysms/dissection  MEDICATIONS Aspirin 81 mg po daily Tylenol Extra Strength 2 tablets daily Metamucil daily  ALLERGIES NKDA  SOCIAL HISTORY Lives with husband of 12 years.  Works as a Patent examiner. Denies tobacco or recreational drug use. Reports social alcohol use. She walks daily with no chest pain or dyspnea.  Review of Systems: Constitutional: denies fever/chills, weight loss, +night sweats since hysterectomy Skin: denies rash, alopecia Neuro: denies dizziness, syncope, changes in vision; +headache only after nitroglycerin HEENT: no sore throat, nasal congestion, dysphagia Pulm: no cough, wheezing CV: No palpitations, diaphoresis GI: No N/V/D, constipation, hematemesis, abdominal pain, melena, bright red blood per rectum GU: menopausal; no dysuria, polyuria, vaginal discharge/bleeding  Physical Exam: Blood pressure 103/71, pulse 73, temperature 98.2 F (36.8 C), temperature source Oral, resp. rate 18, SpO2 97.00%. R arm BP 107/75; L arm BP 108/66 General: Obese woman lying in bed on her right side in no acute distress   HEENT: PERRL, EOMI, moist mucous membranes, no cervical/supraclavicular lymphadenopathy; no JVD appreciated  Cardiovascular: Regular rate and rhythm, no murmurs, rubs or gallops; no carotid bruits Respiratory: Clear to auscultation bilaterally, no wheezes, rales, or rhonchi   Abdomen: +BS; soft, obese; mild tenderness to palpation in LLQ with no rebound or guarding; no hepatosplenomegaly or masses appreciated   Extremities: DP and radial 2+; no clubbing or cyanosis; trace pitting edema to mid calf bilterally Skin: no rashes observed in exposed areas   Neuro: Alert and oriented x 3, cranial nerves III-XII, Strength 4/5 in  LEFT UE secondary to pain and 5/5 in LEFT LE; Strength 5/5 in RIGHT UE and LE; Fine touch sensation intact in UE and LE; Negative Tinel's sign; 2+ Biceps, Patellar and Achilles reflexes MSK: Full range of motion in cervical spine with pain on extension on rotation to the left; left trapezius and deltoid tender to palpation; pt is unable to abduct left arm above head; + Neer's test; no pain on palpation of chest wall;  cervical/thoracic/lumbar spine non-tender to palpation.  Lab results: Results for orders placed during the hospital encounter of 05/02/12 (from the past 24 hour(s))  CBC     Status: Abnormal   Collection Time   05/02/12  4:05 AM      Component Value Range   WBC 10.8 (*) 4.0 - 10.5 K/uL   RBC 4.78  3.87 - 5.11 MIL/uL   Hemoglobin 12.9  12.0 - 15.0 g/dL   HCT 16.1  09.6 - 04.5 %   MCV 82.6  78.0 - 100.0 fL   MCH 27.0  26.0 - 34.0 pg   MCHC 32.7  30.0 - 36.0 g/dL   RDW 40.9  81.1 - 91.4 %   Platelets 284  150 - 400 K/uL  COMPREHENSIVE METABOLIC PANEL     Status: Abnormal   Collection Time   05/02/12  4:05 AM      Component Value Range   Sodium 139  135 - 145 mEq/L   Potassium 3.9  3.5 - 5.1 mEq/L   Chloride 104  96 - 112 mEq/L  CO2 25  19 - 32 mEq/L   Glucose, Bld 93  70 - 99 mg/dL   BUN 14  6 - 23 mg/dL   Creatinine, Ser 1.61  0.50 - 1.10 mg/dL   Calcium 9.2  8.4 - 09.6 mg/dL   Total Protein 7.3  6.0 - 8.3 g/dL   Albumin 3.3 (*) 3.5 - 5.2 g/dL   AST 19  0 - 37 U/L   ALT 22  0 - 35 U/L   Alkaline Phosphatase 82  39 - 117 U/L   Total Bilirubin 0.2 (*) 0.3 - 1.2 mg/dL   GFR calc non Af Amer >90  >90 mL/min   GFR calc Af Amer >90  >90 mL/min  TROPONIN I     Status: Normal   Collection Time   05/02/12  4:06 AM      Component Value Range   Troponin I <0.30  <0.30 ng/mL     Imaging results:  Dg Chest 2 View  05/02/2012  *RADIOLOGY REPORT*  Clinical Data: Left arm pain for 3 weeks.  CHEST - 2 VIEW  Comparison: Chest radiograph performed 01/10/2012  Findings: The lungs are well-aerated.  Pulmonary vascularity is at the upper limits of normal.  There is no evidence of focal opacification, pleural effusion or pneumothorax.  The heart is borderline normal in size; the mediastinal contour is within normal limits.  No acute osseous abnormalities are seen.  IMPRESSION: No acute cardiopulmonary process seen.   Original Report Authenticated By: Tonia Ghent, M.D.    Ct Angio Chest Pe  W/cm &/or Wo Cm  05/02/2012  *RADIOLOGY REPORT*  Clinical Data: Sharp chest pain; shortness of breath.  Right arm pain.  CT ANGIOGRAPHY CHEST  Technique:  Multidetector CT imaging of the chest using the standard protocol during bolus administration of intravenous contrast. Multiplanar reconstructed images including MIPs were obtained and reviewed to evaluate the vascular anatomy.  Contrast: OMNIPAQUE IOHEXOL 350 MG/ML SOLN  Comparison: Chest radiograph performed earlier today at 04:23 a.m., and CTA of the chest performed 06/13/2009  Findings: There is no evidence of pulmonary embolus.  Minimal bibasilar atelectasis is noted.  An accessory azygos lobe is seen.  There is no evidence of significant focal consolidation, pleural effusion or pneumothorax.  No masses are identified; no abnormal focal contrast enhancement is seen.  There is mild aneurysmal dilatation of the ascending thoracic aorta to 4.2 cm in maximal AP dimension.  The mediastinum is otherwise unremarkable in appearance.  No mediastinal lymphadenopathy is seen.  No pericardial effusion is identified.  The great vessels are grossly unremarkable in appearance.  No axillary lymphadenopathy is seen.  The visualized portions of the thyroid gland are unremarkable in appearance.  The visualized portions of the liver and spleen are unremarkable. The visualized portions of the pancreas, gallbladder, stomach, adrenal glands and kidneys are within normal limits.  No acute osseous abnormalities are seen.  There is partial fusion of vertebral bodies T9-T11.  IMPRESSION:  1.  No evidence of pulmonary embolus. 2.  Minimal bibasilar atelectasis noted; accessory azygos lobe seen. 3.  Mild aneurysmal dilatation of the ascending thoracic aorta to 4.2 cm in maximal AP dimension. 4.  Partial fusion of vertebral bodies T9-T11.   Original Report Authenticated By: Tonia Ghent, M.D.     Other results: EKG: EKG significant for ST depressions in leads avF, V3, V4,  V5, V6; T wave inversion V1; RBBB; LAE; no ST elevations, no LVH, no LBBB, no RVH; EKG 01/09/12 significant for  ST depressions in leads V1, V2 with RBBB and LAE  Assessment & Plan by Problem: Michele Gross a 56 y/o woman with PMH significant for DJD of cervical and thoracic spine; OA of knees; GERD; multiple prior ED visits/hospitalizations for chest pain with negative work-up for ACS presents today with chest pain. Symptoms started with three weeks of pain along the left aspect of her neck with tingling in the fingers of her left hand. The differential for these symptoms includes an ACS, cervical radiculopathy and rotator cuff impingement syndrome. The differential for chest pain is broad including PE (no Well's criteria for DVT; chest CT negative for PE), aortic dissection given thoracic aneurysm of 4.2 cm seen on CT with history of LV wall thickening on Echo 2008 suggesting HTN (no radiation of pain to the back, consistent blood pressure in both arms, and no evidence of dissection on CT), pneumothorax (no evidence on CXR), pneumonia (no fever, no elevated WBC and no evidence on CXR), GERD (this is possible with her history; however, she notes this pain is different from that associated with GERD), pericarditis (denied positional pain and no rub on exam; no diffuse ST elevations on EKG), myositis (no fever or elevated WBC), esophageal rupture (no hematemesis, vomiting), coronary artery vasospasm (no known history of HTN but LV wall thickening consistent with diagnosis; although duration of pain is not consistent with this diagnosis) and ACS (no known risk factors for CAD; pain is substernal although atypical not relieved by nitroglycerin and lasting hours; radiation of pain to her left arm and neck; negative troponins x 1; EKG with ST depressions in anterior leads).   1. Chest Pain: Based on physical exam, labs and imaging results differential still includes ACS (troponin negative x 1; ST depressions in  anterior leads consistent with NSTEMI), pericarditis (ESR/CRP to screen), and GERD. --Continue 81 mg po Aspirin daily for secondary prevention --Start 80 mg atorvastatin for secondary prevention --Continue to cycle cardiac enzymes x 2; Repeat EKG tomorrow morning --Obtain Echo to evaluate for diastolic dysfunction given LV wall thickening seen on prior Echo in 2008 and symptoms of orthopnea and PND --Strict I&O --Pending ESR/CRP to evaluate for pericarditis, if ESR/CRP elevation consider empiric treatment with 600 mg Ibuprofen three times daily for 1 week course --To further evaluate etiology of possible pericarditis obtain U/A, Urine CXR. No evidence of recent viral infection by clinical history, exam or CXR. --Start Protonix 40 mg po once daily given history of GERD related chest pain --Fasting lipids, AIC and TSH for CAD risk stratification  2. Thoracic Ascending Aortic Aneurysm: This is a new finding not seen on prior Chest CT from 05/2009, although the thoracic aorta was not well opacified on this prior study. There is no evidence of dissection on physical exam or CT imaging. The aneurysm is 4.2 cm. Per AHA guidelines on the management of thoracic aortic aneurysms, with diameter less than 5.5 cm and no signs of dissection there is no indication for immediate surgical intervention.  --Consulted Vascular Surgery Dr. Laneta Simmers for recommendations on management. Advised outpatient follow-up after discharge given size of aneurysm with initiation of beta blockade as HR and BP tolerate. With no evidence of dissection Vascular Surgery agreed chest pain is most likely not related to aneurysm. Recommend 6 month repeat CT chest for ongoing monitoring. --Consider beta blockade metoprolol 50 mg po twice daily for target HR less than or equal to 60 --Continue to monitor Blood pressure, currently low normal. Target systolic blood pressue less than or  equal to 120 mm Hg  --Fasting lipid profile to assess for  hyperlipidemia  3. Neck and arm pain: Pain in left aspect of neck and arm could be secondary to cervical radiculopathy vs. rotator cuff impingement with positive Neer's sign. Her mild weakness in the left UE was secondary to pain with the patient able to provide increased effort. Provide pain management with Ibuprofen. MRI 2010 showed mild to moderate spinal stenosis at C3-4 and C4-5, and mild stenosis at C5-6 --Ibuprofen as needed for pain   4. LLQ Tenderness to Palpation: mild tenderness in LLQ with no peritoneal signs, the patient notes regular bowel movements with daily Metamucil supplementation.  --Continue senna/docusate for bowel regimen --Continue to monitor  5. DVT Prophylaxis: Lovenox and SCD  6. Code: FULL Code   This is a Psychologist, occupational Note.  The care of the patient was discussed with Dr. Bosie Clos and the assessment and plan was formulated with their assistance.  Please see their note for official documentation of the patient encounter.   Signed: Serita Sheller, MS4 [facility]  [place] December 05/02/2012, 10:46 AM

## 2012-05-03 ENCOUNTER — Encounter (HOSPITAL_COMMUNITY): Payer: Self-pay

## 2012-05-03 DIAGNOSIS — R0789 Other chest pain: Secondary | ICD-10-CM

## 2012-05-03 DIAGNOSIS — K219 Gastro-esophageal reflux disease without esophagitis: Secondary | ICD-10-CM

## 2012-05-03 LAB — BASIC METABOLIC PANEL
BUN: 13 mg/dL (ref 6–23)
Calcium: 9.1 mg/dL (ref 8.4–10.5)
Chloride: 105 mEq/L (ref 96–112)
Creatinine, Ser: 0.58 mg/dL (ref 0.50–1.10)
GFR calc Af Amer: >90 mL/min (ref >90)
GFR calc non Af Amer: >90 mL/min (ref >90)

## 2012-05-03 LAB — CK TOTAL AND CKMB (NOT AT ARMC)
CK, MB: 1.7 ng/mL (ref 0.3–4.0)
Relative Index: INVALID (ref 0.0–2.5)
Relative Index: INVALID (ref 0.0–2.5)
Total CK: 82 U/L (ref 7–177)
Total CK: 83 U/L (ref 7–177)

## 2012-05-03 LAB — CBC
HCT: 39.7 % (ref 36.0–46.0)
MCHC: 33 g/dL (ref 30.0–36.0)
Platelets: 287 10*3/uL (ref 150–400)
RDW: 15.3 % (ref 11.5–15.5)

## 2012-05-03 LAB — RAPID URINE DRUG SCREEN, HOSP PERFORMED
Barbiturates: NOT DETECTED
Cocaine: NOT DETECTED

## 2012-05-03 MED ORDER — ASPIRIN 81 MG PO TBEC: 81.0000 mg | DELAYED_RELEASE_TABLET | Freq: Every day | ORAL | Status: AC

## 2012-05-03 MED ORDER — IBUPROFEN 800 MG PO TABS: 800.0000 mg | ORAL_TABLET | Freq: Four times a day (QID) | ORAL | Status: DC

## 2012-05-03 MED ORDER — METOPROLOL TARTRATE 12.5 MG HALF TABLET: 12.5000 mg | ORAL_TABLET | Freq: Two times a day (BID) | ORAL | Status: DC

## 2012-05-03 MED ORDER — OXYCODONE-ACETAMINOPHEN 5-325 MG PO TABS: 1.0000 | ORAL_TABLET | ORAL | Status: DC | PRN

## 2012-05-03 MED ORDER — PERFLUTREN LIPID MICROSPHERE
INTRAVENOUS | Status: AC
  Filled 2012-05-03: qty 10

## 2012-05-03 NOTE — Progress Notes (Signed)
On review of pt's records for cardiology consult, it was noted Dr. Ladona Ridgel was her primary cardiologist I have asked Trish, [place] cardmaster to arrange consult here with their MD.

## 2012-05-03 NOTE — Progress Notes (Signed)
Followup appts at [facility]: Stress Echo 05/13/12 at 7:30am Norma Fredrickson, NP 05/24/12 at 8:30am Info added to d/c section.  Dayna Dunn PA-C

## 2012-05-03 NOTE — Discharge Summary (Signed)
[facility] Discharge Note  Name: Michele Gross MRN: [id number] DOB: Mar 31, 1956 56 y.o.  Date of Admission: 05/02/2012  3:38 AM Date of Discharge: 05/03/2012 Attending Physician: Farley Ly, MD  Discharge Diagnosis: Principal Problem:  *Chest pain, atypical Active Problems:  Cervical radiculopathy due to degenerative joint disease of spine   Discharge Medications:   Medication List     As of 05/03/2012  1:09 PM    ASK your doctor about these medications         acetaminophen 500 MG tablet   Commonly known as: TYLENOL   Take 1,000 mg by mouth every 6 (six) hours as needed. For pain      diphenhydrAMINE 25 MG tablet   Commonly known as: BENADRYL   Take 25 mg by mouth every 6 (six) hours as needed. For itching        Disposition and follow-up:   Michele Gross was discharged from [facility] in Stablecondition.    Follow-up Appointments:     Follow-up Information    Follow up with Roosevelt Locks, MD. (Monday December 16th at [facility])    Contact information:   Internal Medicine Follow-up [address], [address] [place] [address] [phone]       Follow up with [facility] (Stress Echocardiogram Test on 05/13/12 at 7:30am)    Contact information:   [address] [place] [address] [phone]       Follow up with Norma Fredrickson, NP. (05/24/12 at 8:30am)    Contact information:   [address] [place] [address] [phone]         Discharge Orders    Future Appointments: Provider: Department: Dept Phone: Center:   05/13/2012 7:30 AM [facility]Echo Echo 3 [facility] SITE 3 ECHO LAB [phone] None   05/13/2012 7:30 AM [facility]m Nuclear 2 [facility]) [facility] SITE 3 NUCLEAR MED [phone] None   05/24/2012 8:30 AM Rosalio Macadamia, NP [facility] Main Office [place]) [phone]  [id number]      Consultations: Treatment Team:  Rounding L[facility]iology, MD  Procedures Performed:  Dg Chest 2 View  05/02/2012  *RADIOLOGY REPORT*  Clinical Data: Left arm pain for 3 weeks.  CHEST - 2 VIEW  Comparison: Chest radiograph performed 01/10/2012  Findings: The lungs are well-aerated.  Pulmonary vascularity is at the upper limits of normal.  There is no evidence of focal opacification, pleural effusion or pneumothorax.  The heart is borderline normal in size; the mediastinal contour is within normal limits.  No acute osseous abnormalities are seen.  IMPRESSION: No acute cardiopulmonary process seen.   Original Report Authenticated By: Tonia Ghent, M.D.    Dg Cervical Spine 2-3 Views  05/02/2012  *RADIOLOGY REPORT*  Clinical Data: Neck pain.  Left arm pain.  No known injury. Symptoms for 3 weeks.  CERVICAL SPINE - 2-3 VIEW  Comparison: MRI of the cervical spine, CT of the cervical spine 08/20/2008, 08/18/2008  Findings: Swimmer's view cannot be performed as the patient and is not willing/able to raise the arm.  There are degenerative changes, most notable at C5-6.  No evidence for fracture or subluxation from C1-C6.  Detail below the top of C7 is limited.  IMPRESSION:  1.  No evidence for acute abnormality C1-C6. 2.  If there is clinical concern for fracture, a cervical spine CT is recommended.   Original Report Authenticated By: Norva Pavlov,  M.D.    Ct Angio Chest Pe W/cm &/or Wo Cm  05/02/2012  *RADIOLOGY REPORT*  Clinical Data: Sharp chest pain; shortness of breath.  Right arm pain.  CT ANGIOGRAPHY CHEST  Technique:  Multidetector CT imaging of the chest using the standard protocol during bolus administration of intravenous contrast. Multiplanar reconstructed images including MIPs were obtained and reviewed to evaluate the vascular anatomy.  Contrast: OMNIPAQUE IOHEXOL 350 MG/ML SOLN  Comparison: Chest radiograph performed earlier today at 04:23 a.m., and CTA of the chest  performed 06/13/2009  Findings: There is no evidence of pulmonary embolus.  Minimal bibasilar atelectasis is noted.  An accessory azygos lobe is seen.  There is no evidence of significant focal consolidation, pleural effusion or pneumothorax.  No masses are identified; no abnormal focal contrast enhancement is seen.  There is mild aneurysmal dilatation of the ascending thoracic aorta to 4.2 cm in maximal AP dimension.  The mediastinum is otherwise unremarkable in appearance.  No mediastinal lymphadenopathy is seen.  No pericardial effusion is identified.  The great vessels are grossly unremarkable in appearance.  No axillary lymphadenopathy is seen.  The visualized portions of the thyroid gland are unremarkable in appearance.  The visualized portions of the liver and spleen are unremarkable. The visualized portions of the pancreas, gallbladder, stomach, adrenal glands and kidneys are within normal limits.  No acute osseous abnormalities are seen.  There is partial fusion of vertebral bodies T9-T11.  IMPRESSION:  1.  No evidence of pulmonary embolus. 2.  Minimal bibasilar atelectasis noted; accessory azygos lobe seen. 3.  Mild aneurysmal dilatation of the ascending thoracic aorta to 4.2 cm in maximal AP dimension. 4.  Partial fusion of vertebral bodies T9-T11.   Original Report Authenticated By: Tonia Ghent, M.D.    2D Echocardiogram with contrast: 05/03/2012  LV EF: 50% - 55%  ------------------------------------------------------------ Indications: Previous (stress echo) 02/13/2010. Chest pain 786.51.  ------------------------------------------------------------ History: PMH: Orthopnea. Obesity. Cervical radiculopathy due to degenerative joint disease of spine. Reflux. Back pain. Risk factors: Dyslipidemia.  ------------------------------------------------------------ Study Conclusions  Left ventricle: The cavity size was normal. Systolic function was normal. The estimated ejection fraction was  in the range of 50% to 55%. Wall motion was normal; there were no regional wall motion abnormalities. There was an increased relative contribution of atrial contraction to ventricular filling. Doppler parameters are consistent with abnormal left ventricular relaxation (grade 1 diastolic dysfunction). Transthoracic echocardiography. M-mode, complete 2D, spectral Doppler, and color Doppler. Height: Height: 160cm. Height: 63in. Weight: Weight: 121.8kg. Weight: 268lb. Body mass index: BMI: 47.6kg/m^2. Body surface area: BSA: 2.1m^2. Blood pressure: 118/62. Patient status: Inpatient. Location: Echo laboratory.  ------------------------------------------------------------  ------------------------------------------------------------ Left ventricle: The cavity size was normal. Systolic function was normal. The estimated ejection fraction was in the range of 50% to 55%. Wall motion was normal; there were no regional wall motion abnormalities. There was an increased relative contribution of atrial contraction to ventricular filling. Doppler parameters are consistent with abnormal left ventricular relaxation (grade 1 diastolic dysfunction).  ------------------------------------------------------------ Aortic valve: Trileaflet; normal thickness leaflets. Mobility was not restricted. Doppler: Transvalvular velocity was within the normal range. There was no stenosis. No regurgitation.  ------------------------------------------------------------ Aorta: Aortic root: The aortic root was normal in size.  ------------------------------------------------------------ Mitral valve: Structurally normal valve. Mobility was not restricted. Doppler: Transvalvular velocity was within the normal range. There was no evidence for stenosis. No regurgitation.  ------------------------------------------------------------ Left atrium: The atrium was normal in  size.  ------------------------------------------------------------ Right ventricle: The cavity size was normal. Wall  thickness was normal. Systolic function was normal.  ------------------------------------------------------------ Pulmonic valve: Doppler: Transvalvular velocity was within the normal range. There was no evidence for stenosis. Mild regurgitation.  ------------------------------------------------------------ Tricuspid valve: Structurally normal valve. Doppler: Transvalvular velocity was within the normal range. No regurgitation.  ------------------------------------------------------------ Pulmonary artery: The main pulmonary artery was normal-sized. Systolic pressure was within the normal range.  ------------------------------------------------------------ Right atrium: The atrium was normal in size.  ------------------------------------------------------------ Pericardium: There was no pericardial effusion.  ------------------------------------------------------------ Systemic veins: Inferior vena cava: The vessel was normal in size.  ------------------------------------------------------------  2D measurements Normal Doppler measurements Normal Left ventricle Left ventricle LVID ED, 49 mm 43-52 Ea, lat ann, 9.3 cm/s ------ chord, tiss DP 6 PLAX E/Ea, lat 6.5 ------ LVID ES, 33 mm 23-38 ann, tiss DP 4 chord, Ea, med ann, 6.5 cm/s ------ PLAX tiss DP 3 FS, chord, 33 % >29 E/Ea, med 9.3 ------ PLAX ann, tiss DP 7 LVPW, ED 12 mm ------ Mitral valve IVS/LVPW 0.92 <1.3 Peak E vel 61. cm/s ------ ratio, ED 2 Ventricular septum Peak A vel 91. cm/s ------ IVS, ED 11 mm ------ 8 Aorta Deceleration 264 ms 150-23 Root diam, 37 mm ------ time 0 ED Peak E/A 0.7 ------ Left atrium ratio AP dim 37 mm ------ Right ventricle AP dim 1.54 cm/m^2 <2.2 Sa vel, lat 13. cm/s ------ index ann, tiss DP  7  ------------------------------------------------------------ Prepared and Electronically Authenticated by  Armanda Magic 2013-11-11T12:32:[id number]   Admission HPI: Ms. Fimbres is a 56 yo female with past medical history significant for gastroesophageal reflux disease, osteoarthritis, and recurrent chest pain with negative prior cardiac workup including EKGs w/o ischemic changes, negative cardiac enzymes, normal Exercise Stress ECHO 04/24/2009 who presents to the ED with complaints of sharp chest pain that has been unrelenting since yesterday. Pain increases when she reclines especially on left side. In addition she states that she has been having neck and left arm pain and numbness. Pt states that the pain has been ongoing consistently without alleviation and without radiation to back or jaw, no diaphoresis, no nausea, no palpitations but occasional shortness of breath. She reports paroxysmal nocturnal orthopnea and sleeping on 3 pillows. In ED she received Nitroglycerin which did not relieve her pain. Morphine was administered in ED which pt reports "took the edge off".   Admission Physical Exam:  Blood pressure 104/62, pulse 67, temperature 98.2 F (36.8 C), temperature source Oral, resp. rate 20, SpO2 97.00%. General: Obese, AA female, Well-developed, well-nourished, in mild distress intermittantly during the exam Head: Normocephalic, atraumatic. Eyes: PERRLA, EOMI, murky sclera Neck: supple, no masses, no carotid Bruits, no JVD appreciated. Lungs: Normal respiratory effort. Clear to auscultation bilaterally from apices to bases without crackles or wheezes appreciated. Heart: normal rate, regular rhythm, normal S1 and S2, no gallop, murmur, or rubs appreciated. Abdomen: BS normoactive. Soft, Nondistended, non-tender. No masses or organomegaly appreciated. Extremities: decreased ROM of left UE notable for decreased abduction at glenohumeral jt and pain elicited on passive motion of left UE,  decreased left lateral rotation of the neck and pain in left arm elicited on full extension and flexion of the neck, positive Neers, trace pretibial edema, distal pulses intact Neurologic: grossly non-focal, alert and oriented x3, appropriate and cooperative throughout examination.    Hospital Course by problem list:  1. Atypical Chest Pain: The patient has a PMH significant for atypical chest pain with multiple negative cardiac evaluations for MI/ischemia including a normal nuclear study in 2007 and normal stress Echo in 2011. CTA  and Chest X-ray were negative for pneumothorax, pulmonary embolism and pneumonia. With negative troponin x 3 and no acute changes on EKG, an MI was unlikely. Although the patient had no CAD risk factors, stable/unstable angina is still a possibility and the patient was started on aspirin for empiric secondary prevention. Cardiology was consulted and they have scheduled outpatient follow-up with the patient and plan an Echo stress test. Her chest pain may also be referred pain from her cervical radiculopathy given resolution of her chest, neck and left arm pain this morning. Due to her history of orthopnea and PND, an Echo was conducted which showed preserved left ventricular ejection fraction 50-55%, no wall motion abnormalities and no valve abnormalities. The Echo did reveal grade I diastolic dysfunction. The patient's chest pain persisted from admission until this morning. Her pain was managed with ibuprofen, initially started for presumptive treatment of pericarditis and continued for management of cervical radiculopathy/rotator cuff nerve impingement; and Percocet, her home pain medication.  With no CAD risk factors LDL 105 is within goal.   2. Thoracic Ascending Aortic Aneurysm: This is a new finding not seen on prior Chest CT from 05/2009. There is no evidence of dissection on physical exam or CTA imaging. The aneurysm is 4.2 cm. Per AHA guidelines on the management of  thoracic aortic aneurysms, with diameter less than 5.5 cm and no signs of dissection there is no indication for immediate surgical intervention. Dr. Laneta Simmers in Thoracic Surgery was consulted and recommended outpatient follow with him in 6 months when he will also repeat the Chest CT. Beta blocker metoprolol 12.5 mg twice daily was started on 05/02/12. Blood pressure has been within target of systolic blood pressure less than or equal to 120 mm Hg with no intervention.   3. Cervical spondylosis with spinal stenosis: MRI of the cervical spine in 2010 showed mild to moderate spinal stenosis at C3-4 and C4-5, and mild stenosis at C5-6. The patient's pain in the left aspect of the neck, left shoulder and left arm is most likely secondary to cervical radiculopathy. Her pain was significantly improved with ibuprofen 800 mg twice daily and her home medication Percocet. We recommend continued ibuprofen and Percocet.   4. Rotator Cuff Impingement: Inability to completely abduct left shoulder points to rotator cuff impingement. The patient has no muscle weakness or loss of sensation. Her pain has been effectively managed with ibuprofen and percocet. Consider follow-up with physical therapy if pain does not continue to improve with NSAIDs and rest.  5. Pre-diabetes: Hemoglobin A1C was 5.8 consistent with pre-diabetes. The patient was counseled on the importance of diet, exercise and weight loss in preventing the progression to diabetes.   6. Morbid obesity: BMI 47.7. The patient was counseled on the importance of weight loss through diet and exercise.   7. GERD: Continue Protonix 40 mg daily.  Discharge Vitals:  BP 118/62  Pulse 67  Temp 98.3 F (36.8 C) (Oral)  Resp 18  Ht 5\' 3"  (1.6 m)  Wt 268 lb 8.3 oz (121.8 kg)  BMI 47.57 kg/m2  SpO2 97%  Discharge Labs:  Results for orders placed during the hospital encounter of 05/02/12 (from the past 24 hour(s))  PRO B NATRIURETIC PEPTIDE     Status: Normal    Collection Time   05/02/12  2:17 PM      Component Value Range   Pro B Natriuretic peptide (BNP) 15.8  0 - 125 pg/mL  MRSA PCR SCREENING     Status: Normal  Collection Time   05/02/12  3:01 PM      Component Value Range   MRSA by PCR NEGATIVE  NEGATIVE  TROPONIN I     Status: Normal   Collection Time   05/02/12  4:13 PM      Component Value Range   Troponin I <0.30  <0.30 ng/mL  CK TOTAL AND CKMB     Status: Normal   Collection Time   05/02/12  4:13 PM      Component Value Range   Total CK 91  7 - 177 U/L   CK, MB 1.6  0.3 - 4.0 ng/mL   Relative Index RELATIVE INDEX IS INVALID  0.0 - 2.5  SEDIMENTATION RATE     Status: Normal   Collection Time   05/02/12  4:13 PM      Component Value Range   Sed Rate 20  0 - 22 mm/hr  HEMOGLOBIN A1C     Status: Abnormal   Collection Time   05/02/12  4:13 PM      Component Value Range   Hemoglobin A1C 5.8 (*) <5.7 %   Mean Plasma Glucose 120 (*) <117 mg/dL  TSH     Status: Normal   Collection Time   05/02/12  4:13 PM      Component Value Range   TSH 3.411  0.350 - 4.500 uIU/mL  LIPASE, BLOOD     Status: Normal   Collection Time   05/02/12  4:13 PM      Component Value Range   Lipase 33  11 - 59 U/L  LIPID PANEL     Status: Abnormal   Collection Time   05/02/12  4:23 PM      Component Value Range   Cholesterol 192  0 - 200 mg/dL   Triglycerides 119  <147 mg/dL   HDL 66  >82 mg/dL   Total CHOL/HDL Ratio 2.9     VLDL 21  0 - 40 mg/dL   LDL Cholesterol 956 (*) 0 - 99 mg/dL  URINE RAPID DRUG SCREEN (HOSP PERFORMED)     Status: Abnormal   Collection Time   05/02/12  6:30 PM      Component Value Range   Opiates POSITIVE (*) NONE DETECTED   Cocaine NONE DETECTED  NONE DETECTED   Benzodiazepines NONE DETECTED  NONE DETECTED   Amphetamines NONE DETECTED  NONE DETECTED   Tetrahydrocannabinol POSITIVE (*) NONE DETECTED   Barbiturates NONE DETECTED  NONE DETECTED  TROPONIN I     Status: Normal   Collection Time   05/02/12 11:25 PM       Component Value Range   Troponin I <0.30  <0.30 ng/mL  CK TOTAL AND CKMB     Status: Normal   Collection Time   05/02/12 11:25 PM      Component Value Range   Total CK 82  7 - 177 U/L   CK, MB 1.7  0.3 - 4.0 ng/mL   Relative Index RELATIVE INDEX IS INVALID  0.0 - 2.5  TROPONIN I     Status: Normal   Collection Time   05/03/12  5:05 AM      Component Value Range   Troponin I <0.30  <0.30 ng/mL  CK TOTAL AND CKMB     Status: Normal   Collection Time   05/03/12  5:05 AM      Component Value Range   Total CK 83  7 - 177 U/L   CK, MB 1.7  0.3 - 4.0 ng/mL   Relative Index RELATIVE INDEX IS INVALID  0.0 - 2.5  BASIC METABOLIC PANEL     Status: Normal   Collection Time   05/03/12  5:05 AM      Component Value Range   Sodium 139  135 - 145 mEq/L   Potassium 3.8  3.5 - 5.1 mEq/L   Chloride 105  96 - 112 mEq/L   CO2 25  19 - 32 mEq/L   Glucose, Bld 79  70 - 99 mg/dL   BUN 13  6 - 23 mg/dL   Creatinine, Ser 1.47  0.50 - 1.10 mg/dL   Calcium 9.1  8.4 - 82.9 mg/dL   GFR calc non Af Amer >90  >90 mL/min   GFR calc Af Amer >90  >90 mL/min  CBC     Status: Normal   Collection Time   05/03/12  5:05 AM      Component Value Range   WBC 8.5  4.0 - 10.5 K/uL   RBC 4.86  3.87 - 5.11 MIL/uL   Hemoglobin 13.1  12.0 - 15.0 g/dL   HCT 56.2  13.0 - 86.5 %   MCV 81.7  78.0 - 100.0 fL   MCH 27.0  26.0 - 34.0 pg   MCHC 33.0  30.0 - 36.0 g/dL   RDW 78.4  69.6 - 29.5 %   Platelets 287  150 - 400 K/uL  CK TOTAL AND CKMB     Status: Normal   Collection Time   05/03/12 10:36 AM      Component Value Range   Total CK 96  7 - 177 U/L   CK, MB 1.7  0.3 - 4.0 ng/mL   Relative Index RELATIVE INDEX IS INVALID  0.0 - 2.5    Signed: Kristie Cowman 05/03/2012, 1:09 PM   Time Spent on Discharge: 60 min Services Ordered on Discharge: none Equipment Ordered on Discharge: none

## 2012-05-03 NOTE — H&P (Addendum)
Internal Medicine Attending Admission Note Date: 05/03/2012  Patient name: Michele Gross Medical record number: [id number] Date of birth: May 06, 1956 Age: 56 y.o. Gender: female  I saw and evaluated the patient. I reviewed the resident's note and I agree with the resident's findings and plan as documented in the resident's note, with the following additional comments..  Chief Complaint(s): Chest pain  History - key components related to admission: Patient is a 56 year old woman with history of GERD, osteoarthritis, GERD, obesity, hyperlipidemia, chest pain with negative stress Myoview in March of 2007, cervical spondylosis/cervical spinal stenosis, and other problems as outlined in medical history, admitted with complaint of chest pain located in the substernal area and below the left breast which began about 3 AM on the morning of admission; she had severe pain for about 3 hours, then the pain improved but has persisted since then.  She also reports left neck, shoulder, and arm pain for the past 3 weeks with associated numbness, with abduction limited by pain.   Physical Exam - key components related to admission:  Filed Vitals:   05/02/12 1932 05/02/12 2330 05/03/12 0349 05/03/12 0815  BP: 102/80 108/71 118/62   Pulse:      Temp: 98.6 F (37 C) 98.3 F (36.8 C) 98.3 F (36.8 C) 100 F (37.8 C)  TempSrc: Oral Oral Oral Oral  Resp: 20 18 18    Height:      Weight:      SpO2: 98% 96% 96% 94%   General: Alert, oriented Neck: Pain with range of motion Lungs: Clear Heart: Regular; S1-S2, no S3, no S4, no murmurs Abdomen: Bowel sounds present, soft, nontender Extremities: No edema; upper extremity strength and DTRs are intact; abduction of shoulder is limited by left neck pain  Lab results:   Basic Metabolic Panel:  Basename 05/03/12 0505 05/02/12 0405  NA 139 139  K 3.8 3.9  CL 105 104  CO2 25 25  GLUCOSE 79 93  BUN 13 14  CREATININE 0.58 0.63  CALCIUM 9.1 9.2  MG --  --  PHOS -- --   Liver Function Tests:  Basename 05/02/12 0405  AST 19  ALT 22  ALKPHOS 82  BILITOT 0.2*  PROT 7.3  ALBUMIN 3.3*    Basename 05/02/12 1613  LIPASE 33  AMYLASE --    CBC:  Basename 05/03/12 0505 05/02/12 0405  WBC 8.5 10.8*  NEUTROABS -- --  HGB 13.1 12.9  HCT 39.7 39.5  MCV 81.7 82.6  PLT 287 284   Cardiac Enzymes:  Basename 05/03/12 0505 05/02/12 2325 05/02/12 1613  CKTOTAL 83 82 91  CKMB 1.7 1.7 1.6  CKMBINDEX -- -- --  TROPONINI <0.30 <0.30 <0.30    Hemoglobin A1C:  Basename 05/02/12 1613  HGBA1C 5.8*   Fasting Lipid Panel:  Basename 05/02/12 1623  CHOL 192  HDL 66  LDLCALC 105*  TRIG 105  CHOLHDL 2.9  LDLDIRECT --   Thyroid Function Tests:  Basename 05/02/12 1613  TSH 3.411  T4TOTAL --  FREET4 --  T3FREE --  THYROIDAB --    Drugs of Abuse     Component Value Date/Time   LABOPIA POSITIVE* 05/02/2012 1830   COCAINSCRNUR NONE DETECTED 05/02/2012 1830   LABBENZ NONE DETECTED 05/02/2012 1830   AMPHETMU NONE DETECTED 05/02/2012 1830   THCU POSITIVE* 05/02/2012 1830   LABBARB NONE DETECTED 05/02/2012 1830     Imaging results:  Dg Chest 2 View  05/02/2012  *RADIOLOGY REPORT*  Clinical Data: Left arm pain  for 3 weeks.  CHEST - 2 VIEW  Comparison: Chest radiograph performed 01/10/2012  Findings: The lungs are well-aerated.  Pulmonary vascularity is at the upper limits of normal.  There is no evidence of focal opacification, pleural effusion or pneumothorax.  The heart is borderline normal in size; the mediastinal contour is within normal limits.  No acute osseous abnormalities are seen.  IMPRESSION: No acute cardiopulmonary process seen.   Original Report Authenticated By: Tonia Ghent, M.D.    Dg Cervical Spine 2-3 Views  05/02/2012  *RADIOLOGY REPORT*  Clinical Data: Neck pain.  Left arm pain.  No known injury. Symptoms for 3 weeks.  CERVICAL SPINE - 2-3 VIEW  Comparison: MRI of the cervical spine, CT of the cervical spine  08/20/2008, 08/18/2008  Findings: Swimmer's view cannot be performed as the patient and is not willing/able to raise the arm.  There are degenerative changes, most notable at C5-6.  No evidence for fracture or subluxation from C1-C6.  Detail below the top of C7 is limited.  IMPRESSION:  1.  No evidence for acute abnormality C1-C6. 2.  If there is clinical concern for fracture, a cervical spine CT is recommended.   Original Report Authenticated By: Norva Pavlov, M.D.    Ct Angio Chest Pe W/cm &/or Wo Cm  05/02/2012  *RADIOLOGY REPORT*  Clinical Data: Sharp chest pain; shortness of breath.  Right arm pain.  CT ANGIOGRAPHY CHEST  Technique:  Multidetector CT imaging of the chest using the standard protocol during bolus administration of intravenous contrast. Multiplanar reconstructed images including MIPs were obtained and reviewed to evaluate the vascular anatomy.  Contrast: OMNIPAQUE IOHEXOL 350 MG/ML SOLN  Comparison: Chest radiograph performed earlier today at 04:23 a.m., and CTA of the chest performed 06/13/2009  Findings: There is no evidence of pulmonary embolus.  Minimal bibasilar atelectasis is noted.  An accessory azygos lobe is seen.  There is no evidence of significant focal consolidation, pleural effusion or pneumothorax.  No masses are identified; no abnormal focal contrast enhancement is seen.  There is mild aneurysmal dilatation of the ascending thoracic aorta to 4.2 cm in maximal AP dimension.  The mediastinum is otherwise unremarkable in appearance.  No mediastinal lymphadenopathy is seen.  No pericardial effusion is identified.  The great vessels are grossly unremarkable in appearance.  No axillary lymphadenopathy is seen.  The visualized portions of the thyroid gland are unremarkable in appearance.  The visualized portions of the liver and spleen are unremarkable. The visualized portions of the pancreas, gallbladder, stomach, adrenal glands and kidneys are within normal limits.  No  acute osseous abnormalities are seen.  There is partial fusion of vertebral bodies T9-T11.  IMPRESSION:  1.  No evidence of pulmonary embolus. 2.  Minimal bibasilar atelectasis noted; accessory azygos lobe seen. 3.  Mild aneurysmal dilatation of the ascending thoracic aorta to 4.2 cm in maximal AP dimension. 4.  Partial fusion of vertebral bodies T9-T11.   Original Report Authenticated By: Tonia Ghent, M.D.     Other results: EKG: Sinus rhythm; borderline repolarization abnormality   Assessment & Plan by Problem:  1.  Chest pain.  Patient had a prolonged episode of severe chest pain which improved following admission; she has had some persisting constant pain but much better.  There is no evidence of MI by enzymes or EKG.  She had a prior negative thallium scan in 2007.  There was no evidence of dissection of the ascending aortic aneurysm found on CT scan, so this is  likely an incidental finding and unrelated to the chest pain.  Plan is monitor; aspirin; 2-D echocardiogram; beta blocker; cardiology consult regarding possible need for stress study to assess for CAD.  2.  Cervical spondylosis with cervical spinal stenosis.  Patient reports three-week history of left neck, left shoulder, and left arm pain with intermittent numbness and difficulty using her left arm because of pain.  A prior MRI of the cervical spine in March of 2010 showed cervical disc degeneration and spondylosis, with mild to moderate spinal stenosis at C3-4 and C4-5 and mild stenosis at C5-6.  Today her pain is better with high dose ibuprofen and Percocet, and her strength is intact.  Plan for now is conservative treatment with analgesics; if her pain persists, then repeat MRI of the neck and neurosurgical referral would be appropriate.  3.  Ascending aortic aneurysm, thoracic.  There was no evidence of dissection on the CT angiogram of the chest.  This was discussed with thoracic surgery and plan is for outpatient thoracic surgery  followup with repeat scan to monitor the size of the aneurysm in 6 months.

## 2012-05-03 NOTE — Progress Notes (Signed)
Patient ID: Michele Gross, female   DOB: Sep 28, 1955, 56 y.o.   MRN: [id number] edical Student Daily Progress Note   Subjective:    Interval Events:  No acute events overnight. Per nursing the patient has been doing well. Pain has been managed with ibuprofen and percocet.  Subjectively the patient reports she is doing "great" this morning. Chest pain has resolved, and the pain in the left aspect of her neck, shoulder and arm has improved significantly now a 6/10. The patient denies any dyspnea, N/V/D, muscle weakness, bowel/bladder incontinence. She is ready to go home.    Objective:    Vital Signs:   Temp:  [98.3 F (36.8 C)-100 F (37.8 C)] 98.3 F (36.8 C) (11/11 1103) Pulse Rate:  [67] 67  (11/10 1400) Resp:  [18-20] 18  (11/11 0349) BP: (102-118)/(62-80) 118/62 mmHg (11/11 0349) SpO2:  [94 %-98 %] 97 % (11/11 1103) Weight:  [268 lb 8.3 oz (121.8 kg)] 268 lb 8.3 oz (121.8 kg) (11/10 1600)     Weights: 24-hour Weight change:   Filed Weights   05/02/12 1600  Weight: 268 lb 8.3 oz (121.8 kg)      Intake/Output:   Intake/Output Summary (Last 24 hours) at 05/03/12 1129 Last data filed at 05/03/12 0330  Gross per 24 hour  Intake    120 ml  Output      0 ml  Net    120 ml        Physical Exam: General: Obese woman sitting n bed in no acute distress  HEENT: PERRL, EOMI, moist mucous membranes Cardiovascular: Regular rate and rhythm, no murmurs, rubs or gallops; no carotid bruits  Respiratory: Clear to auscultation bilaterally, no wheezes, rales, or rhonchi  Abdomen: +BS; soft, obese; non-tender to palpation, no hepatosplenomegaly or masses appreciated  Extremities: DP and radial 2+; no clubbing, cyanosis, or edema Skin: no rashes observed in exposed areas  Neuro: Alert and oriented x 3, Strength 5/5 in UE and LE; Good grip bilaterally MSK: Full range of motion in cervical spine with no pain; L trapezius/deltoid non-tender to palpation; limited abduction of left  shoulder with full passive range of motion; negative Spurling's; cervical/thoracic/lumbar spine non-tender to palpation.    Labs: Basic Metabolic Panel:  Lab 05/03/12 4098 05/02/12 0405  NA 139 139  K 3.8 3.9  CL 105 104  CO2 25 25  GLUCOSE 79 93  BUN 13 14  CREATININE 0.58 0.63  CALCIUM 9.1 9.2  MG -- --  PHOS -- --    Liver Function Tests:  Lab 05/02/12 0405  AST 19  ALT 22  ALKPHOS 82  BILITOT 0.2*  PROT 7.3  ALBUMIN 3.3*    Lab 05/02/12 1613  LIPASE 33  AMYLASE --   No results found for this basename: AMMONIA:3 in the last 168 hours  CBC:  Lab 05/03/12 0505 05/02/12 0405  WBC 8.5 10.8*  NEUTROABS -- --  HGB 13.1 12.9  HCT 39.7 39.5  MCV 81.7 82.6  PLT 287 284    Cardiac Enzymes:  Lab 05/03/12 0505 05/02/12 2325 05/02/12 1613 05/02/12 0406  CKTOTAL 83 82 91 --  CKMB 1.7 1.7 1.6 --  CKMBINDEX -- -- -- --  TROPONINI <0.30 <0.30 <0.30 <0.30    BNP (last 3 results):  Basename 05/02/12 1417  PROBNP 15.8    CBG: No results found for this basename: GLUCAP:5 in the last 168 hours  Coagulation Studies: No results found for this basename: LABPROT:5,INR:5 in the last 72  hours  Microbiology: Results for orders placed during the hospital encounter of 05/02/12  MRSA PCR SCREENING     Status: Normal   Collection Time   05/02/12  3:01 PM      Component Value Range Status Comment   MRSA by PCR NEGATIVE  NEGATIVE Final     Other results: EKG Results:  05/03/2012   Imaging: Dg Chest 2 View  05/02/2012  *RADIOLOGY REPORT*  Clinical Data: Left arm pain for 3 weeks.  CHEST - 2 VIEW  Comparison: Chest radiograph performed 01/10/2012  Findings: The lungs are well-aerated.  Pulmonary vascularity is at the upper limits of normal.  There is no evidence of focal opacification, pleural effusion or pneumothorax.  The heart is borderline normal in size; the mediastinal contour is within normal limits.  No acute osseous abnormalities are seen.  IMPRESSION: No  acute cardiopulmonary process seen.   Original Report Authenticated By: Tonia Ghent, M.D.    Dg Cervical Spine 2-3 Views  05/02/2012  *RADIOLOGY REPORT*  Clinical Data: Neck pain.  Left arm pain.  No known injury. Symptoms for 3 weeks.  CERVICAL SPINE - 2-3 VIEW  Comparison: MRI of the cervical spine, CT of the cervical spine 08/20/2008, 08/18/2008  Findings: Swimmer's view cannot be performed as the patient and is not willing/able to raise the arm.  There are degenerative changes, most notable at C5-6.  No evidence for fracture or subluxation from C1-C6.  Detail below the top of C7 is limited.  IMPRESSION:  1.  No evidence for acute abnormality C1-C6. 2.  If there is clinical concern for fracture, a cervical spine CT is recommended.   Original Report Authenticated By: Norva Pavlov, M.D.    Ct Angio Chest Pe W/cm &/or Wo Cm  05/02/2012  *RADIOLOGY REPORT*  Clinical Data: Sharp chest pain; shortness of breath.  Right arm pain.  CT ANGIOGRAPHY CHEST  Technique:  Multidetector CT imaging of the chest using the standard protocol during bolus administration of intravenous contrast. Multiplanar reconstructed images including MIPs were obtained and reviewed to evaluate the vascular anatomy.  Contrast: OMNIPAQUE IOHEXOL 350 MG/ML SOLN  Comparison: Chest radiograph performed earlier today at 04:23 a.m., and CTA of the chest performed 06/13/2009  Findings: There is no evidence of pulmonary embolus.  Minimal bibasilar atelectasis is noted.  An accessory azygos lobe is seen.  There is no evidence of significant focal consolidation, pleural effusion or pneumothorax.  No masses are identified; no abnormal focal contrast enhancement is seen.  There is mild aneurysmal dilatation of the ascending thoracic aorta to 4.2 cm in maximal AP dimension.  The mediastinum is otherwise unremarkable in appearance.  No mediastinal lymphadenopathy is seen.  No pericardial effusion is identified.  The great vessels are grossly  unremarkable in appearance.  No axillary lymphadenopathy is seen.  The visualized portions of the thyroid gland are unremarkable in appearance.  The visualized portions of the liver and spleen are unremarkable. The visualized portions of the pancreas, gallbladder, stomach, adrenal glands and kidneys are within normal limits.  No acute osseous abnormalities are seen.  There is partial fusion of vertebral bodies T9-T11.  IMPRESSION:  1.  No evidence of pulmonary embolus. 2.  Minimal bibasilar atelectasis noted; accessory azygos lobe seen. 3.  Mild aneurysmal dilatation of the ascending thoracic aorta to 4.2 cm in maximal AP dimension. 4.  Partial fusion of vertebral bodies T9-T11.   Original Report Authenticated By: Tonia Ghent, M.D.       Medications:  Infusions:    . sodium chloride Stopped (05/02/12 1515)     Scheduled Medications:    . aspirin EC  81 mg Oral Daily  . enoxaparin (LOVENOX) injection  40 mg Subcutaneous Q24H  . ibuprofen  800 mg Oral Q6H  . influenza  inactive virus vaccine  0.5 mL Intramuscular Tomorrow-1000  . metoprolol tartrate  12.5 mg Oral BID  . [COMPLETED]  morphine injection  2 mg Intravenous Once  . pantoprazole  40 mg Oral Daily  . perflutren lipid microspheres (DEFINITY) IV suspension      . sodium chloride  3 mL Intravenous Q12H  . [DISCONTINUED] ibuprofen  800 mg Oral Q4H     PRN Medications: nitroGLYCERIN, oxyCODONE-acetaminophen, polyethylene glycol    Assessment/ Plan:    Assessment & Plan by Problem:  Ms. Hamill a 56 y/o woman with PMH significant for cervical spondylosis with spinal stenosis MRI 2010; OA; GERD; multiple prior ED visits/hospitalizations for chest pain with negative work-up for ACS presents with substernal chest pain and left neck, shoulder and arm pain concerning for ACS and cervical radiculopathy.  1. Atypical Chest Pain: Now resolved but pain lasted 24 hours. MI unlikely with troponin negative x 3 and no acute EKG  changes. The patient has a history of chest pain with multiple negative cardiac evaluations. Stable/unstable angina is still a possibility and the patient has been started on aspirin for secondary prevention. The pain may also have been referred from her cervical radiculopathy, this is supported by the almost complete resolution of both her chest, neck and arm pain this morning. At the time of her last stress test in 2011 which was normal, the same shoulder pain was noted. The patient would benefit from outpatient physical therapy. --Pending Cardiology consult for further recommendations, inpatient vs. outpatient stress testing --Continue 81 mg po Aspirin daily for secondary prevention  --Discharge on 10 mg atorvastatin for secondary prevention   --Obtain Echo to evaluate for diastolic dysfunction given LV wall thickening seen on prior Echo in 2008 and symptoms of orthopnea and PND. Pro-BNP normal 15.8.  --Start Protonix 40 mg po once daily given history of GERD related chest pain  --Fasting lipids showed LDL 105, patient would benefit from a low dose statin --A1C 5.8 consistent with pre-diabetes, the patient will be counseled on the importance of diet and exercise to prevent progression.   2. Thoracic Ascending Aortic Aneurysm: This is a new finding not seen on prior Chest CT from 05/2009, although the thoracic aorta was not well opacified on this prior study. There is no evidence of dissection on physical exam or CT imaging. The aneurysm is 4.2 cm. Per AHA guidelines on the management of thoracic aortic aneurysms, with diameter less than 5.5 cm and no signs of dissection there is no indication for immediate surgical intervention. Dr. Laneta Simmers in Thoracic Surgery was consulted and recommended outpatient follow with repeat scan in 6 months.  -- Beta blocker metoprolol 12.5 mg po twice daily started with target HR less than or equal to 60  --Continue to monitor Blood pressure, currently low normal. Target  systolic blood pressue less than or equal to 120 mm Hg  --Low dose statin for secondary CAD prevention --Schedule outpatient follow-up with Dr. Laneta Simmers  3. Cervical spondylosis with spinal stenosis: MRI 2010 showed mild to moderate spinal stenosis at C3-4 and C4-5, and mild stenosis at C5-6. Pain in left aspect of neck and arm likley secondary to cervical radiculopathy. Inability to completely abduct left shoulder points  to rotator cuff impingement. The patient has no muscle weakness or loss of sensation. Her pain has been effectively managed with ibuprofen and percocet.  --Discontinue Percocet --Continue Ibuprofen for pain 400 mg every 6 hours as needed --Schedule outpatient PT follow-up  4. LLQ Tenderness to Palpation: Resolved. Bowel movement yesterday. --Continue Miralax.  --Continue to monitor   5. DVT Prophylaxis: Lovenox and SCD  6. Code: FULL Code     Length of Stay: 1 days   This is a Psychologist, occupational Note.  The care of the patient was discussed with Dr. Bosie Clos and the assessment and plan formulated with their assistance.  Please see their attached note or addendum for official documentation of the daily encounter.

## 2012-05-03 NOTE — Consult Note (Signed)
CARDIOLOGY CONSULT NOTE  Patient ID: Michele Gross, MRN: [id number], DOB/AGE: 54-Sep-1957 53 y.o. Admit date: 05/02/2012   Date of Consult: 05/03/2012 Primary Physician: Burtis Junes, MD Primary Cardiologist: Ladona Ridgel saw in consult first remotely, never seen in office  Chief Complaint: Chest pain Reason for Consult: Chest pain  HPI: Michele Gross is a 56 yo female with a history of GERD, obesity, OSA, and chest pain (reportedly neg cath in 2000, normal nuclear 2007 & normal stress echo 2011). She presented with complaints of chest pain that initially began overnight Sunday night at 3am waking her out of sleep, described as a stabbing sharp pain. It was located substernally and radiated to L side of her chest and to her neck and arm and was accompanied by SOB. This is different than her prior CP/GERD sx. The pain was worse with reclining. The pain was unchanged by deep inspiration, movement, meals or palpation. She did not try anything at home. In the ED, she received 4 baby ASA and NTG which possibly provided relief. Morphine completely relieved the pain (lasted 3 hours in total). She had a recurrent episode of CP/arm pain last night lasting an hour relieved by Percocet. She feels well today. She also reports a 3 week hx of L arm pain particularly when lifting that extremity. It is not bothering her as much today. She denies DOE, baseline SOB, or LE edema. Pt sleeps with 3 pillows at night for her GERD and reports waking from sleep due to sleep apnea. No fever, cough, or chills. CE's neg x 4. EKG nonspecific ST-T changes similar to 12/2011. THC positive on urine drug screen. CT angio: neg for PE, mild asc thoracic aorta dilitation to 4.2cm. ESR, lipase WNL, LFTs unremarkable. WBC 10.8 -> 8.5.  Past Medical History  Diagnosis Date  . Chest pain     Negative nuclear stress 2007, negative stress echo 2011, reported negative cath 2000  . GERD (gastroesophageal reflux disease)   . Sleep apnea     . DJD (degenerative joint disease)       Most Recent Cardiac Studies: 01/2010 Study Conclusions Stress ECG conclusions: Baseline nonspecific ST-T abnormalities. No significant changes during the study. Impressions: - Peak stress echo images were difficult and not all wall segments   were well-visualized but there did not appear to be any wall   motion abnormalities. Probably normal study.  08/2005 Nuclear Stress Test IMPRESSION: Negative stress Myoview study revealing adequate exercise capacity.  Normal left ventricular size. Normal left ventricular systolic function.  No electrocardiographic evidence for myocardial ischemia. By scintigraphic  imaging, there was breast attenuation artifact but no perfusion  abnormalities. Other findings as noted.   Surgical History:  Past Surgical History  Procedure Date  . Partial hysterectomy      Home Meds: Prior to Admission medications   Medication Sig Start Date End Date Taking? Authorizing Provider  acetaminophen (TYLENOL) 500 MG tablet Take 1,000 mg by mouth every 6 (six) hours as needed. For pain   Yes Historical Provider, MD  diphenhydrAMINE (BENADRYL) 25 MG tablet Take 25 mg by mouth every 6 (six) hours as needed. For itching   Yes Historical Provider, MD    Inpatient Medications:     . aspirin EC  81 mg Oral Daily  . enoxaparin (LOVENOX) injection  40 mg Subcutaneous Q24H  . ibuprofen  800 mg Oral Q6H  . influenza  inactive virus vaccine  0.5 mL Intramuscular Tomorrow-1000  . metoprolol tartrate  12.5 mg Oral  BID  . pantoprazole  40 mg Oral Daily  . perflutren lipid microspheres (DEFINITY) IV suspension      . sodium chloride  3 mL Intravenous Q12H  . [DISCONTINUED] ibuprofen  800 mg Oral Q4H    Allergies: No Known Allergies  History   Social History  . Marital Status: Married    Spouse Name: N/A    Number of Children: N/A  . Years of Education: N/A   Occupational History  . 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 Smoking status: Never Smoker   . Smokeless tobacco: Not on file  . Alcohol Use: 1.2 oz/week    2 Glasses of wine per week     Comment: occassional  . Drug Use: No  . Sexually Active:    Other Topics Concern  . Not on file   Social History Narrative  . No narrative on file     Family History  Problem Relation Age of Onset  . Cancer Father     colon and later "throat;" in remission     Review of Systems: General: negative for chills, fever, night sweats or weight changes.  Cardiovascular: see above Dermatological: negative for rash Respiratory: negative for cough or wheezing Urologic: negative for hematuria Abdominal: negative for nausea, vomiting, diarrhea, bright red blood per rectum, melena, or hematemesis Neurologic: negative for visual changes, syncope, or dizziness All other systems reviewed and are otherwise negative except as noted above.  Labs:  [facility] 05/03/12 1036 05/03/12 0505 05/02/12 2325 05/02/12 1613 05/02/12 0406  CKTOTAL 96 83 82 91 --  CKMB 1.7 1.7 1.7 1.6 --  TROPONINI -- <0.30 <0.30 <0.30 <0.30   Lab Results  Component Value Date   WBC 8.5 05/03/2012   HGB 13.1 05/03/2012   HCT 39.7 05/03/2012   MCV 81.7 05/03/2012   PLT 287 05/03/2012     Lab 05/03/12 0505 05/02/12 0405  NA 139 --  K 3.8 --  CL 105 --  CO2 25 --  BUN 13 --  CREATININE 0.58 --  CALCIUM 9.1 --  PROT -- 7.3  BILITOT -- 0.2*  ALKPHOS -- 82  ALT -- 22  AST -- 19  GLUCOSE 79 --   Lab Results  Component Value Date   CHOL 192 05/02/2012   HDL 66 05/02/2012   LDLCALC 105* 05/02/2012   TRIG 105 05/02/2012   Lab Results  Component Value Date   DDIMER 0.32 02/16/2011    Radiology/Studies:  Dg Chest 2 View 05/02/2012  *RADIOLOGY REPORT*  Clinical Data: Left arm pain for 3 weeks.  CHEST - 2 VIEW  Comparison: Chest radiograph performed 01/10/2012  Findings: The lungs are well-aerated.  Pulmonary vascularity is at the upper limits of  normal.  There is no evidence of focal opacification, pleural effusion or pneumothorax.  The heart is borderline normal in size; the mediastinal contour is within normal limits.  No acute osseous abnormalities are seen.  IMPRESSION: No acute cardiopulmonary process seen.   Original Report Authenticated By: Tonia Ghent, M.D.    Dg Cervical Spine 2-3 Views 05/02/2012  *RADIOLOGY REPORT*  Clinical Data: Neck pain.  Left arm pain.  No known injury. Symptoms for 3 weeks.  CERVICAL SPINE - 2-3 VIEW  Comparison: MRI of the cervical spine, CT of the cervical spine 08/20/2008, 08/18/2008  Findings: Swimmer's view cannot be performed as the patient and is not willing/able to raise the arm.  There are degenerative  changes, most notable at C5-6.  No evidence for fracture or subluxation from C1-C6.  Detail below the top of C7 is limited.  IMPRESSION:  1.  No evidence for acute abnormality C1-C6. 2.  If there is clinical concern for fracture, a cervical spine CT is recommended.   Original Report Authenticated By: Norva Pavlov, M.D.    Ct Angio Chest Pe W/cm &/or Wo Cm 05/02/2012  *RADIOLOGY REPORT*  Clinical Data: Sharp chest pain; shortness of breath.  Right arm pain.  CT ANGIOGRAPHY CHEST  Technique:  Multidetector CT imaging of the chest using the standard protocol during bolus administration of intravenous contrast. Multiplanar reconstructed images including MIPs were obtained and reviewed to evaluate the vascular anatomy.  Contrast: OMNIPAQUE IOHEXOL 350 MG/ML SOLN  Comparison: Chest radiograph performed earlier today at 04:23 a.m., and CTA of the chest performed 06/13/2009  Findings: There is no evidence of pulmonary embolus.  Minimal bibasilar atelectasis is noted.  An accessory azygos lobe is seen.  There is no evidence of significant focal consolidation, pleural effusion or pneumothorax.  No masses are identified; no abnormal focal contrast enhancement is seen.  There is mild aneurysmal dilatation of the  ascending thoracic aorta to 4.2 cm in maximal AP dimension.  The mediastinum is otherwise unremarkable in appearance.  No mediastinal lymphadenopathy is seen.  No pericardial effusion is identified.  The great vessels are grossly unremarkable in appearance.  No axillary lymphadenopathy is seen.  The visualized portions of the thyroid gland are unremarkable in appearance.  The visualized portions of the liver and spleen are unremarkable. The visualized portions of the pancreas, gallbladder, stomach, adrenal glands and kidneys are within normal limits.  No acute osseous abnormalities are seen.  There is partial fusion of vertebral bodies T9-T11.  IMPRESSION:  1.  No evidence of pulmonary embolus. 2.  Minimal bibasilar atelectasis noted; accessory azygos lobe seen. 3.  Mild aneurysmal dilatation of the ascending thoracic aorta to 4.2 cm in maximal AP dimension. 4.  Partial fusion of vertebral bodies T9-T11.   Original Report Authenticated By: Tonia Ghent, M.D.    EKG: NSR 64 nonspecific T wave changes similar to 12/2011 tracing  Admit EKGs appear similar (there is some nonspecific discrepancy in TWI in III from today's tracing and admit EKG)  Physical Exam: Blood pressure 118/62, pulse 67, temperature 98.3 F (36.8 C), temperature source Oral, resp. rate 18, height 5\' 3"  (1.6 m), weight 268 lb 8.3 oz (121.8 kg), SpO2 97.00%. General: Well developed, well nourished obese AAF in no acute distress. Head: Normocephalic, atraumatic, sclera non-icteric, no xanthomas, nares are without discharge.  Neck: Negative for carotid bruits. JVD not elevated. Lungs: Diminished air movement, coarse BS at bases. No rales or rhonchi. Breathing is unlabored. Heart: RRR with S1 S2. No murmurs, rubs, or gallops appreciated. Abdomen: Soft, non-tender, non-distended with normoactive bowel sounds. No hepatomegaly. No rebound/guarding. No obvious abdominal masses. Msk:  Strength and tone appear normal for age. Extremities: No  clubbing or cyanosis. No edema.  Distal pedal pulses are 2+ and equal bilaterally. Neuro: Alert and oriented X 3. No facial asymmetry. No focal deficit. Moves all extremities spontaneously. Psych:  Responds to questions appropriately with a normal affect.   Assessment and Plan: 1. Chest pain, atypical - no objective evidence of ischemia. EKG is mildly abnormal at baseline but essentially unchanged from 12/2011. ESR WNL, doubt pericarditis. Neg w/u in past and no major cardiac risk factors unless you count obesity. Will discuss further w/u with MD.  2. GERD - continue protonix. 3. 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 4. Morbid obesity BMI 47.7 - consider nutrition counseling.  Signed, Ronie Spies PA-C 05/03/2012, 12:32 PM  I have personally seen and examined this patient with Ronie Spies, PA-C. I agree with the assessment and plan as outlined above. Her pain is atypical. Her only risk factor for CAD is obesity. She had a normal cath in 2000 per records. EKG non-specific. Cardiac markers negative. No further inpatient workup in indicated. Will arrange outpatient stress echo. OK to d/c home from our perspective. Pt in agreement with this plan. We will arrange stress test and outpatient f/u in our office. D/W primary team.   MCALHANY,CHRISTOPHER 12:44 PM 05/03/2012

## 2012-05-03 NOTE — ED Provider Notes (Signed)
Medical screening examination/treatment/procedure(s) were performed by non-physician practitioner and as supervising physician I was immediately available for consultation/collaboration.  See original H&P.  Evaluated pt in the CDU after CTA was performed.  Noted the dilatation of the ascending aorta without evidence of leakage or rupture.  Heide Spark PA placed a consult with vascular surgery.  Plan was for serial troponins and if her pain resolved and she had no trop elevation, she would be discharged home to follow-up closely with a cardiologist for further evaluation.  Tobin Chad, MD 05/03/12 [phone]

## 2012-05-03 NOTE — Progress Notes (Signed)
Resident Addendum to Medical Student Note   I have seen and examined the patient, and agree with the the medical student assessment and plan outlined above. Please see my brief note below for additional details.  S: No chest pain today   OBJECTIVE: VS: Reviewed  Meds: Reviewed  Labs: Reviewed  Imaging: Reviewed   Physical Exam: General: Obese, AA female, Well-developed, well-nourished, NAD Lungs: Normal respiratory effort. Clear to auscultation bilaterally from apices to bases without crackles or wheezes appreciated. Heart: normal rate, regular rhythm, normal S1 and S2, no gallop, murmur, or rubs appreciated. Abdomen: BS normoactive. Soft, Nondistended, non-tender. No masses or organomegaly appreciated. Extremities: decreased ROM of left UE notable for decreased abduction at glenohumeral jt and pain elicited on passive motion of left UE, decreased left lateral rotation of the neck and pain in left arm elicited on full extension and flexion of the neck, positive Neers, trace pretibial edema, distal pulses intact Neurologic: grossly non-focal, alert and oriented x3, appropriate and cooperative throughout examination.    ASSESSMENT/ PLAN: Pt is a 56 y.o. yo female with a PMHx of chest pain and degenerative vertebral disease who was admitted on 05/02/2012 with symptoms of chest pain, left arm and neck pain, which was determined to be secondary to cervical radiculopathy.   1. Chest Pain, atypical: no ischemic cardiac source identified, pain resolved with scheduled high dose ibuprofen and Percocet prn, ECHO with grade1 diastolic dysfunction, Cardiology evaluate will pain for outpt f/u    Lab 05/03/12 0505 05/02/12 2325 05/02/12 1613 05/02/12 0406  TROPONINI <0.30 <0.30 <0.30 <0.30    2. 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 Likely progression of her  pathology. Reports pain and numbness but no overt weakness elicited on finger extension and hand grip. She does have reduced cervical and glenohumeral range of motion.  -cont NSAIDs as noted above  -out pt referral to Neurosurgeon   3. Ascending Aortic Aneurysm,Thoracic: no evidence of dissection on CT Angio of chest. 4.2 cm thus not large enough to warrant repair at this point (indicated at >5.5cm).  - f/u Thoracic Surgeon Dr. Garen Grams as outpatient.   4. Dispo: d/c home today     Length of Stay: 1   Kristie Cowman, MD PGY2, Internal Medicine Resident 05/03/2012, 1:01 PM

## 2012-05-03 NOTE — Progress Notes (Signed)
  Echocardiogram 2D Echocardiogram with Definity has been performed.  Luretha Eberly 05/03/2012, 11:03 AM

## 2012-05-03 NOTE — Care Management Note (Signed)
    Page 1 of 1   05/03/2012     11:19:47 AM   CARE MANAGEMENT NOTE 05/03/2012  Patient:  Michele Gross, Michele Gross   Account Number:  [id number]  Date Initiated:  05/03/2012  Documentation initiated by:  Junius Creamer  Subjective/Objective Assessment:   adm w ch pain     Action/Plan:   lives w husband, pcp dr Britt Bottom blount   Anticipated DC Date:  05/03/2012   Anticipated DC Plan:  HOME/SELF CARE      DC Planning Services  CM consult      Choice offered to / List presented to:             Status of service:   Medicare Important Message given?   (If response is "NO", the following Medicare IM given date fields will be blank) Date Medicare IM given:   Date Additional Medicare IM given:    Discharge Disposition:  HOME/SELF CARE  Per UR Regulation:  Reviewed for med. necessity/level of care/duration of stay  If discussed at Long Length of Stay Meetings, dates discussed:    Comments:  11/11 11:19a debbie Kida Digiulio rn,bsn [phone]

## 2012-05-07 ENCOUNTER — Other Ambulatory Visit (HOSPITAL_COMMUNITY): Payer: Self-pay | Admitting: Cardiovascular Disease

## 2012-05-07 DIAGNOSIS — R072 Precordial pain: Secondary | ICD-10-CM

## 2012-05-13 ENCOUNTER — Other Ambulatory Visit (HOSPITAL_COMMUNITY): Payer: Self-pay | Admitting: *Deleted

## 2012-05-13 ENCOUNTER — Ambulatory Visit (HOSPITAL_BASED_OUTPATIENT_CLINIC_OR_DEPARTMENT_OTHER): Payer: Self-pay

## 2012-05-13 ENCOUNTER — Ambulatory Visit (HOSPITAL_COMMUNITY): Payer: Self-pay | Attending: Internal Medicine

## 2012-05-13 DIAGNOSIS — R079 Chest pain, unspecified: Secondary | ICD-10-CM

## 2012-05-13 DIAGNOSIS — R0989 Other specified symptoms and signs involving the circulatory and respiratory systems: Secondary | ICD-10-CM

## 2012-05-13 DIAGNOSIS — E785 Hyperlipidemia, unspecified: Secondary | ICD-10-CM | POA: Insufficient documentation

## 2012-05-13 DIAGNOSIS — G4730 Sleep apnea, unspecified: Secondary | ICD-10-CM | POA: Insufficient documentation

## 2012-05-13 DIAGNOSIS — K219 Gastro-esophageal reflux disease without esophagitis: Secondary | ICD-10-CM | POA: Insufficient documentation

## 2012-05-13 DIAGNOSIS — E669 Obesity, unspecified: Secondary | ICD-10-CM | POA: Insufficient documentation

## 2012-05-13 DIAGNOSIS — R072 Precordial pain: Secondary | ICD-10-CM | POA: Insufficient documentation

## 2012-05-13 MED ORDER — PERFLUTREN PROTEIN A MICROSPH IV SUSP
2.0000 mL | Freq: Once | INTRAVENOUS | Status: AC
  Administered 2012-05-13: 2 mL via INTRAVENOUS

## 2012-05-13 NOTE — Progress Notes (Signed)
Echocardiogram performed.  

## 2012-05-18 ENCOUNTER — Ambulatory Visit: Payer: Self-pay | Admitting: Surgery

## 2012-05-24 ENCOUNTER — Encounter: Payer: Self-pay | Admitting: Nurse Practitioner

## 2012-05-24 ENCOUNTER — Ambulatory Visit (INDEPENDENT_AMBULATORY_CARE_PROVIDER_SITE_OTHER): Payer: Self-pay | Admitting: Nurse Practitioner

## 2012-05-24 VITALS — BP 110/76 | HR 64 | Ht 63.0 in | Wt 271.8 lb

## 2012-05-24 DIAGNOSIS — R0789 Other chest pain: Secondary | ICD-10-CM

## 2012-05-24 DIAGNOSIS — I7789 Other specified disorders of arteries and arterioles: Secondary | ICD-10-CM

## 2012-05-24 NOTE — Patient Instructions (Addendum)
I think you are doing well.  Stay active and keep walking every day.  You need to see Dr. Laneta Simmers in 6 months for follow up of your enlarged aorta  Stay on your current medicines  See Dr. Bruna Potter  We will see you back as needed  Call the [facility] office at [phone] if you have any questions, problems or concerns.

## 2012-05-24 NOTE — Progress Notes (Addendum)
Linde Gillis Date of Birth: 05/31/1956 Medical Record #[id number]  History of Present Illness: Michele Gross is seen back today for a post hospital visit. She is seen for Dr. Clifton James. She has had a recent admission for atypical chest pain. Has had follow up stress testing with a negative stress echo. Other issues include cervical radiculopathy due to DJD of the spine, GERD, and OA. She is morbidly obese.   She was recently hospitalized with chest pain. Negative enzymes. She has had multiple negative evaluations in the past with normal stress echo in November 2010.  She has no CAD risk factors. An echo showed normal EF at 50 to 55%, no wall motion abnormalities and no valve issues. She does have grade 1 diastolic dysfunction. A chest CT showed a new thoracic ascending aortic aneurysm measuring 4.2 cm. She was seen by Dr. Laneta Simmers who recommended follow up in 6 months and good BP control.    She comes in today. She is here alone. She is doing well. No more chest pain. Still with left arm pain that is worse with movement. Has limited range of motion. BP has been ok. She cancelled her appointment with Dr. Laneta Simmers for last week.   Current Outpatient Prescriptions on File Prior to Visit  Medication Sig Dispense Refill  . aspirin EC 81 MG EC tablet Take 1 tablet (81 mg total) by mouth daily.  30 tablet  1  . ibuprofen (ADVIL,MOTRIN) 800 MG tablet Take 1 tablet (800 mg total) by mouth every 6 (six) hours.  30 tablet  0  . metoprolol tartrate (LOPRESSOR) 12.5 mg TABS Take 0.5 tablets (12.5 mg total) by mouth 2 (two) times daily.  60 tablet  2    No Known Allergies  Past Medical History  Diagnosis Date  . Chest pain     Negative nuclear stress 2007, negative stress echo 2011, reported negative cath 2000; negative stress echo November 2013  . GERD (gastroesophageal reflux disease)   . Sleep apnea   . DJD (degenerative joint disease)   . Obesity     Past Surgical History  Procedure Date  .  Partial hysterectomy     History  Smoking status  . Never Smoker   Smokeless tobacco  . Not on file    History  Alcohol Use  . 1.2 oz/week  . 2 Glasses of wine per week    Comment: occassional    Family History  Problem Relation Age of Onset  . Cancer Father     colon and later "throat;" in remission    Review of Systems: The review of systems is per the HPI.  All other systems were reviewed and are negative.  Physical Exam: BP 110/76  Pulse 64  Ht 5\' 3"  (1.6 m)  Wt 271 lb 12.8 oz (123.288 kg)  BMI 48.15 kg/m2 Patient is very pleasant and in no acute distress. She is morbidly obese. Skin is warm and dry. Color is normal.  HEENT is unremarkable. Normocephalic/atraumatic. PERRL. Sclera are nonicteric. Neck is supple. No masses. No JVD. Lungs are clear. Cardiac exam shows a regular rate and rhythm. Abdomen is soft. Extremities are without edema. Gait and ROM are intact except she has limited ROM of the left arm. No gross neurologic deficits noted.  LABORATORY DATA: N/A  Lab Results  Component Value Date   WBC 8.5 05/03/2012   HGB 13.1 05/03/2012   HCT 39.7 05/03/2012   PLT 287 05/03/2012   GLUCOSE 79 05/03/2012  CHOL 192 05/02/2012   TRIG 105 05/02/2012   HDL 66 05/02/2012   LDLCALC 105* 05/02/2012   ALT 22 05/02/2012   AST 19 05/02/2012   NA 139 05/03/2012   K 3.8 05/03/2012   CL 105 05/03/2012   CREATININE 0.58 05/03/2012   BUN 13 05/03/2012   CO2 25 05/03/2012   TSH 3.411 05/02/2012   INR 0.98 06/13/2009   HGBA1C 5.8* 05/02/2012   Dg Chest 2 View  IMPRESSION: No acute cardiopulmonary process seen.   Original Report Authenticated By: Tonia Ghent, M.D.    Dg Cervical Spine 2-3 Views  IMPRESSION:  1.  No evidence for acute abnormality C1-C6. 2.  If there is clinical concern for fracture, a cervical spine CT is recommended.   Original Report Authenticated By: Norva Pavlov, M.D.    Ct Angio Chest Pe W/cm &/or Wo Cm  IMPRESSION:  1.  No evidence of  pulmonary embolus. 2.  Minimal bibasilar atelectasis noted; accessory azygos lobe seen. 3.  Mild aneurysmal dilatation of the ascending thoracic aorta to 4.2 cm in maximal AP dimension. 4.  Partial fusion of vertebral bodies T9-T11.   Original Report Authenticated By: Tonia Ghent, M.D.    Echo Study Conclusions  Left ventricle: The cavity size was normal. Systolic function was normal. The estimated ejection fraction was in the range of 50% to 55%. Wall motion was normal; there were no regional wall motion abnormalities. There was an increased relative contribution of atrial contraction to ventricular filling. Doppler parameters are consistent with abnormal left ventricular relaxation (grade 1 diastolic dysfunction).   Stress Echo Study Conclusions  - Stress ECG conclusions: There were no stress arrhythmias or conduction abnormalities. The stress ECG was negative for ischemia. - Staged echo: There was no echocardiographic evidence for stress-induced ischemia. - Impressions: Stress echo normal Needed optison for endocardial definition  Assessment / Plan: 1. Atypical chest pain - negative stress echo. Felt to be stable from our standpoint. Her left arm pain seems more related to OA/shoulder abnormality. She is planning on seeing Dr. Bruna Potter for pain medicine and possible ortho referral.   2. Enlarged thoracic ascending aorta - recommend good BP control. No change in her medicines. See Dr. Laneta Simmers in 6 months.   We will see her back as needed. Encouraged risk factor modification.   Patient is agreeable to this plan and will call if any problems develop in the interim.

## 2012-06-27 ENCOUNTER — Emergency Department (HOSPITAL_COMMUNITY)
Admission: EM | Admit: 2012-06-27 | Discharge: 2012-06-27 | Disposition: A | Discharge status: Home / Self Care | Payer: Self-pay | Attending: Emergency Medicine | Admitting: Emergency Medicine

## 2012-06-27 ENCOUNTER — Encounter (HOSPITAL_COMMUNITY): Payer: Self-pay | Admitting: *Deleted

## 2012-06-27 ENCOUNTER — Emergency Department (HOSPITAL_COMMUNITY): Payer: Self-pay

## 2012-06-27 DIAGNOSIS — M5412 Radiculopathy, cervical region: Secondary | ICD-10-CM | POA: Insufficient documentation

## 2012-06-27 DIAGNOSIS — Z7982 Long term (current) use of aspirin: Secondary | ICD-10-CM | POA: Insufficient documentation

## 2012-06-27 DIAGNOSIS — E669 Obesity, unspecified: Secondary | ICD-10-CM | POA: Insufficient documentation

## 2012-06-27 DIAGNOSIS — Z8719 Personal history of other diseases of the digestive system: Secondary | ICD-10-CM | POA: Insufficient documentation

## 2012-06-27 DIAGNOSIS — IMO0002 Reserved for concepts with insufficient information to code with codable children: Secondary | ICD-10-CM | POA: Insufficient documentation

## 2012-06-27 DIAGNOSIS — Z8679 Personal history of other diseases of the circulatory system: Secondary | ICD-10-CM | POA: Insufficient documentation

## 2012-06-27 DIAGNOSIS — G4730 Sleep apnea, unspecified: Secondary | ICD-10-CM | POA: Insufficient documentation

## 2012-06-27 MED ORDER — PREDNISONE 10 MG PO TABS: ORAL_TABLET | ORAL | Status: DC

## 2012-06-27 MED ORDER — IBUPROFEN 800 MG PO TABS: 800.0000 mg | ORAL_TABLET | Freq: Three times a day (TID) | ORAL | Status: DC

## 2012-06-27 MED ORDER — OXYCODONE-ACETAMINOPHEN 5-325 MG PO TABS: 1.0000 | ORAL_TABLET | ORAL | Status: AC | PRN

## 2012-06-27 MED ORDER — IBUPROFEN 800 MG PO TABS
800.0000 mg | ORAL_TABLET | Freq: Once | ORAL | Status: AC
  Administered 2012-06-27: 800 mg via ORAL
  Filled 2012-06-27: qty 1

## 2012-06-27 MED ORDER — OXYCODONE-ACETAMINOPHEN 5-325 MG PO TABS
1.0000 | ORAL_TABLET | Freq: Once | ORAL | Status: AC
  Administered 2012-06-27: 1 via ORAL
  Filled 2012-06-27: qty 1

## 2012-06-27 NOTE — ED Notes (Signed)
Pt state that she has left arm pain for about 1 month. Denies any known injury. Pt denies chest pain or radiation. Pt states that it is throbbing. Pt has limited movement in arm. No neuro deficits.

## 2012-06-27 NOTE — ED Notes (Signed)
Pt returned from MRI °

## 2012-06-27 NOTE — ED Provider Notes (Signed)
History     CSN: [id number]  Arrival date & time 06/27/12  0913   First MD Initiated Contact with Patient 06/27/12 [phone]      Chief Complaint  Patient presents with  . Arm Pain    (Consider location/radiation/quality/duration/timing/severity/associated sxs/prior treatment) Patient is a 57 y.o. female presenting with arm pain. The history is provided by the patient and medical records. No language interpreter was used.  Arm Pain This is a recurrent problem. Episode onset: Patient is a 57 year old woman who has had pain in her left arm for a month. There is no history of injury. She had been hospitalized in November 2013 and cervical spine films show degenerative change at that time. The problem occurs constantly. The problem has been gradually worsening. Associated symptoms comments: She had been hospitalized in November 2013 for chest pain, and workup for that condition was negative. She had been found to have a small ascending thoracic aortic aneurysm, which is being followed by Dr. Laneta Simmers, cardiovascular surgeon.. Nothing aggravates the symptoms. The symptoms are relieved by medications. Treatments tried: Prior treatment with ibuprofen and Percocet have helped some. She has run out of these medicines.    Past Medical History  Diagnosis Date  . Chest pain     Negative nuclear stress 2007, negative stress echo 2011, reported negative cath 2000; negative stress echo November 2013  . GERD (gastroesophageal reflux disease)   . Sleep apnea   . DJD (degenerative joint disease)   . Obesity     Past Surgical History  Procedure Date  . Partial hysterectomy     Family History  Problem Relation Age of Onset  . Cancer Father     colon and later "throat;" in remission    History  Substance Use Topics  . Smoking status: Never Smoker   . Smokeless tobacco: Not on file  . Alcohol Use: 1.2 oz/week    2 Glasses of wine per week     Comment: occassional    OB History    Grav Para Term  Preterm Abortions TAB SAB Ect Mult Living                  Review of Systems  Constitutional: Negative.  Negative for fever and chills.  HENT: Positive for neck pain.   Eyes: Negative.   Respiratory: Negative.   Cardiovascular: Negative.   Gastrointestinal: Negative.   Genitourinary: Negative.   Musculoskeletal:       Left arm pain.  Skin: Negative.   Neurological: Negative.   Psychiatric/Behavioral: Negative.     Allergies  Review of patient's allergies indicates no known allergies.  Home Medications   Current Outpatient Rx  Name  Route  Sig  Dispense  Refill  . ASPIRIN 81 MG PO TBEC   Oral   Take 1 tablet (81 mg total) by mouth daily.   30 tablet   1   . METOPROLOL TARTRATE 12.5 MG HALF TABLET   Oral   Take 0.5 tablets (12.5 mg total) by mouth 2 (two) times daily.   60 tablet   2     BP 143/93  Pulse 78  Temp 98.7 F (37.1 C) (Oral)  Resp 18  SpO2 96%  Physical Exam  Nursing note and vitals reviewed. Constitutional: She is oriented to person, place, and time.       Pleasant middle-aged woman in mild to moderate distress complaining of pain in the left arm. The pain is felt from the left side of her  neck into her left shoulder and left upper arm.  HENT:  Head: Normocephalic and atraumatic.  Right Ear: External ear normal.  Left Ear: External ear normal.  Mouth/Throat: Oropharynx is clear and moist.  Eyes: Conjunctivae normal and EOM are normal. Pupils are equal, round, and reactive to light. No scleral icterus.  Neck: Normal range of motion. Neck supple.       No palpable deformity of the cervical spine.  Cardiovascular: Normal rate, regular rhythm and normal heart sounds.   Pulmonary/Chest: Effort normal and breath sounds normal.  Abdominal: Soft. Bowel sounds are normal.  Musculoskeletal: Normal range of motion. She exhibits no edema and no tenderness.       She localizes pain into her left shoulder and left upper arm. There is no palpable deformity  of her shoulder or arm. There is no muscle tenderness. She has intact pulses sensation and tendon function in her left hand.  Neurological: She is alert and oriented to person, place, and time.       No sensory or motor deficit.  Skin: Skin is warm and dry.  Psychiatric: She has a normal mood and affect. Her behavior is normal.    ED Course  Procedures (including critical care time)  9:54 AM Patient was seen and had physical examination. Old charts were reviewed. MRI of the cervical spine without contrast was ordered.  11:57 AM Pt back from MRI.  Ordered Ibuprofen 800 mg and Percocet for her arm pain.  12:53 PM Results for orders placed during the hospital encounter of 05/02/12  CBC      Component Value Range   WBC 10.8 (*) 4.0 - 10.5 K/uL   RBC 4.78  3.87 - 5.11 MIL/uL   Hemoglobin 12.9  12.0 - 15.0 g/dL   HCT 45.4  09.8 - 11.9 %   MCV 82.6  78.0 - 100.0 fL   MCH 27.0  26.0 - 34.0 pg   MCHC 32.7  30.0 - 36.0 g/dL   RDW 14.7  82.9 - 56.2 %   Platelets 284  150 - 400 K/uL  COMPREHENSIVE METABOLIC PANEL      Component Value Range   Sodium 139  135 - 145 mEq/L   Potassium 3.9  3.5 - 5.1 mEq/L   Chloride 104  96 - 112 mEq/L   CO2 25  19 - 32 mEq/L   Glucose, Bld 93  70 - 99 mg/dL   BUN 14  6 - 23 mg/dL   Creatinine, Ser 1.30  0.50 - 1.10 mg/dL   Calcium 9.2  8.4 - 86.5 mg/dL   Total Protein 7.3  6.0 - 8.3 g/dL   Albumin 3.3 (*) 3.5 - 5.2 g/dL   AST 19  0 - 37 U/L   ALT 22  0 - 35 U/L   Alkaline Phosphatase 82  39 - 117 U/L   Total Bilirubin 0.2 (*) 0.3 - 1.2 mg/dL   GFR calc non Af Amer >90  >90 mL/min   GFR calc Af Amer >90  >90 mL/min  TROPONIN I      Component Value Range   Troponin I <0.30  <0.30 ng/mL  PRO B NATRIURETIC PEPTIDE      Component Value Range   Pro B Natriuretic peptide (BNP) 15.8  0 - 125 pg/mL  MRSA PCR SCREENING      Component Value Range   MRSA by PCR NEGATIVE  NEGATIVE  TROPONIN I      Component Value Range  Troponin I <0.30  <0.30 ng/mL    TROPONIN I      Component Value Range   Troponin I <0.30  <0.30 ng/mL  TROPONIN I      Component Value Range   Troponin I <0.30  <0.30 ng/mL  CK TOTAL AND CKMB      Component Value Range   Total CK 91  7 - 177 U/L   CK, MB 1.6  0.3 - 4.0 ng/mL   Relative Index RELATIVE INDEX IS INVALID  0.0 - 2.5  CK TOTAL AND CKMB      Component Value Range   Total CK 82  7 - 177 U/L   CK, MB 1.7  0.3 - 4.0 ng/mL   Relative Index RELATIVE INDEX IS INVALID  0.0 - 2.5  CK TOTAL AND CKMB      Component Value Range   Total CK 83  7 - 177 U/L   CK, MB 1.7  0.3 - 4.0 ng/mL   Relative Index RELATIVE INDEX IS INVALID  0.0 - 2.5  SEDIMENTATION RATE      Component Value Range   Sed Rate 20  0 - 22 mm/hr  LIPID PANEL      Component Value Range   Cholesterol 192  0 - 200 mg/dL   Triglycerides 098  <119 mg/dL   HDL 66  >14 mg/dL   Total CHOL/HDL Ratio 2.9     VLDL 21  0 - 40 mg/dL   LDL Cholesterol 782 (*) 0 - 99 mg/dL  HEMOGLOBIN N5A      Component Value Range   Hemoglobin A1C 5.8 (*) <5.7 %   Mean Plasma Glucose 120 (*) <117 mg/dL  TSH      Component Value Range   TSH 3.411  0.350 - 4.500 uIU/mL  BASIC METABOLIC PANEL      Component Value Range   Sodium 139  135 - 145 mEq/L   Potassium 3.8  3.5 - 5.1 mEq/L   Chloride 105  96 - 112 mEq/L   CO2 25  19 - 32 mEq/L   Glucose, Bld 79  70 - 99 mg/dL   BUN 13  6 - 23 mg/dL   Creatinine, Ser 2.13  0.50 - 1.10 mg/dL   Calcium 9.1  8.4 - 08.6 mg/dL   GFR calc non Af Amer >90  >90 mL/min   GFR calc Af Amer >90  >90 mL/min  CBC      Component Value Range   WBC 8.5  4.0 - 10.5 K/uL   RBC 4.86  3.87 - 5.11 MIL/uL   Hemoglobin 13.1  12.0 - 15.0 g/dL   HCT 57.8  46.9 - 62.9 %   MCV 81.7  78.0 - 100.0 fL   MCH 27.0  26.0 - 34.0 pg   MCHC 33.0  30.0 - 36.0 g/dL   RDW 52.8  41.3 - 24.4 %   Platelets 287  150 - 400 K/uL  URINE RAPID DRUG SCREEN (HOSP PERFORMED)      Component Value Range   Opiates POSITIVE (*) NONE DETECTED   Cocaine NONE DETECTED   NONE DETECTED   Benzodiazepines NONE DETECTED  NONE DETECTED   Amphetamines NONE DETECTED  NONE DETECTED   Tetrahydrocannabinol POSITIVE (*) NONE DETECTED   Barbiturates NONE DETECTED  NONE DETECTED  LIPASE, BLOOD      Component Value Range   Lipase 33  11 - 59 U/L  CK TOTAL AND CKMB  Component Value Range   Total CK 96  7 - 177 U/L   CK, MB 1.7  0.3 - 4.0 ng/mL   Relative Index RELATIVE INDEX IS INVALID  0.0 - 2.5   Mr Cervical Spine Wo Contrast  06/27/2012  *RADIOLOGY REPORT*  Clinical Data: Severe left arm pain  MRI CERVICAL SPINE WITHOUT CONTRAST  Technique:  Multiplanar and multiecho pulse sequences of the cervical spine, to include the craniocervical junction and cervicothoracic junction, were obtained according to standard protocol without intravenous contrast.  Comparison: Cervical MRI 08/20/2008  Findings: Normal cervical alignment.  Negative for fracture or mass lesion.  Spinal cord signal is normal.  C2-3:  Negative  C3-4:  Disc degeneration and spondylosis, right greater than left. There is moderate stenosis with flattening of the cord due to osteophyte.  This is unchanged from the  prior study.  Mild right foraminal narrowing.  C4-5:  Disc degeneration and spondylosis, right greater than left. There is moderate spinal stenosis with flattening of the cord. This is unchanged.  There is foraminal encroachment bilaterally due to spurring.  No acute disc protrusion.  C5-6:  Disc degeneration and spondylosis.  There is left foraminal narrowing due to spurring.  C6-7:  Mild disc degeneration and spondylosis without significant spinal stenosis.  C7-T1:  Mild disc degeneration  T1-2:  Small central disc protrusion.  IMPRESSION: Chronic cervical degenerative changes are similar to 2010.  There is moderate spinal stenosis, right greater than left at C3-4 and C4- 5 which is unchanged.  There is  left foraminal narrowing due to spurring at C5-6 also unchanged.   Original Report Authenticated By:  Janeece Riggers, M.D.     MRI of cervical spine shows degenerative disc disease and foraminal stenosis at multiple levels.  Rx with Percocet for pain, Ibuprofen for inflammation, Prednisone taper for inflammation.  She should followup with Dr. Roseanne Reno at [facility].  1. Cervical radiculopathy       Carleene Cooper III, MD 06/27/12 1257

## 2012-09-04 ENCOUNTER — Emergency Department (HOSPITAL_COMMUNITY): Payer: Self-pay

## 2012-09-04 ENCOUNTER — Encounter (HOSPITAL_COMMUNITY): Payer: Self-pay | Admitting: Emergency Medicine

## 2012-09-04 ENCOUNTER — Emergency Department (HOSPITAL_COMMUNITY)
Admission: EM | Admit: 2012-09-04 | Discharge: 2012-09-04 | Disposition: A | Discharge status: Home / Self Care | Payer: Self-pay | Attending: Emergency Medicine | Admitting: Emergency Medicine

## 2012-09-04 DIAGNOSIS — Z7982 Long term (current) use of aspirin: Secondary | ICD-10-CM | POA: Insufficient documentation

## 2012-09-04 DIAGNOSIS — Z8739 Personal history of other diseases of the musculoskeletal system and connective tissue: Secondary | ICD-10-CM | POA: Insufficient documentation

## 2012-09-04 DIAGNOSIS — M549 Dorsalgia, unspecified: Secondary | ICD-10-CM | POA: Insufficient documentation

## 2012-09-04 DIAGNOSIS — R079 Chest pain, unspecified: Secondary | ICD-10-CM | POA: Insufficient documentation

## 2012-09-04 DIAGNOSIS — M542 Cervicalgia: Secondary | ICD-10-CM | POA: Insufficient documentation

## 2012-09-04 DIAGNOSIS — R252 Cramp and spasm: Secondary | ICD-10-CM | POA: Insufficient documentation

## 2012-09-04 DIAGNOSIS — Z79899 Other long term (current) drug therapy: Secondary | ICD-10-CM | POA: Insufficient documentation

## 2012-09-04 DIAGNOSIS — M79609 Pain in unspecified limb: Secondary | ICD-10-CM | POA: Insufficient documentation

## 2012-09-04 DIAGNOSIS — Z8719 Personal history of other diseases of the digestive system: Secondary | ICD-10-CM | POA: Insufficient documentation

## 2012-09-04 DIAGNOSIS — E669 Obesity, unspecified: Secondary | ICD-10-CM | POA: Insufficient documentation

## 2012-09-04 LAB — CBC WITH DIFFERENTIAL/PLATELET
Basophils Absolute: 0 10*3/uL (ref 0.0–0.1)
Eosinophils Relative: 2 % (ref 0–5)
HCT: 41.1 % (ref 36.0–46.0)
Hemoglobin: 13.4 g/dL (ref 12.0–15.0)
Lymphocytes Relative: 41 % (ref 12–46)
MCV: 82.7 fL (ref 78.0–100.0)
Monocytes Absolute: 0.7 10*3/uL (ref 0.1–1.0)
Monocytes Relative: 7 % (ref 3–12)
Neutro Abs: 5.5 10*3/uL (ref 1.7–7.7)
RDW: 15.5 % (ref 11.5–15.5)
WBC: 10.9 10*3/uL — ABNORMAL HIGH (ref 4.0–10.5)

## 2012-09-04 LAB — COMPREHENSIVE METABOLIC PANEL
BUN: 16 mg/dL (ref 6–23)
CO2: 27 mEq/L (ref 19–32)
Calcium: 9.4 mg/dL (ref 8.4–10.5)
Chloride: 104 mEq/L (ref 96–112)
Creatinine, Ser: 0.62 mg/dL (ref 0.50–1.10)
GFR calc Af Amer: >90 mL/min (ref >90)
GFR calc non Af Amer: >90 mL/min (ref >90)
Glucose, Bld: 85 mg/dL (ref 70–99)
Total Bilirubin: 0.2 mg/dL — ABNORMAL LOW (ref 0.3–1.2)

## 2012-09-04 LAB — CK: Total CK: 113 U/L (ref 7–177)

## 2012-09-04 LAB — POCT I-STAT TROPONIN I: Troponin i, poc: 0 ng/mL (ref 0.00–0.08)

## 2012-09-04 LAB — TROPONIN I: Troponin I: <0.3 ng/mL (ref <0.30)

## 2012-09-04 MED ORDER — HYDROCODONE-ACETAMINOPHEN 5-325 MG PO TABS: 1.0000 | ORAL_TABLET | Freq: Four times a day (QID) | ORAL | Status: DC | PRN

## 2012-09-04 MED ORDER — HYDROCODONE-ACETAMINOPHEN 5-325 MG PO TABS
2.0000 | ORAL_TABLET | Freq: Once | ORAL | Status: AC
  Administered 2012-09-04: 2 via ORAL
  Filled 2012-09-04: qty 2

## 2012-09-04 NOTE — ED Provider Notes (Signed)
History     CSN: [id number]  Arrival date & time 09/04/12  1653   First MD Initiated Contact with Patient 09/04/12 1732      Chief Complaint  Patient presents with  . Generalized Body Aches    (Consider location/radiation/quality/duration/timing/severity/associated sxs/prior treatment) HPI Complains of diffuse myalgias including chest pain back pain neck pain and bilateral leg pain onset 2 days ago constant worse with movement or changing position improved with remaining still no shortness of breath no nausea no sweatiness symptoms are nonexertional. Treated with Aleve. No relief. No other associated symptoms. Past Medical History  Diagnosis Date  . Chest pain     Negative nuclear stress 2007, negative stress echo 2011, reported negative cath 2000; negative stress echo November 2013  . GERD (gastroesophageal reflux disease)   . Sleep apnea   . DJD (degenerative joint disease)   . Obesity     Past Surgical History  Procedure Laterality Date  . Partial hysterectomy      Family History  Problem Relation Age of Onset  . Cancer Father     colon and later "throat;" in remission    History  Substance Use Topics  . Smoking status: Never Smoker   . Smokeless tobacco: Not on file  . Alcohol Use: 1.2 oz/week    2 Glasses of wine per week     Comment: occassional    OB History   Grav Para Term Preterm Abortions TAB SAB Ect Mult Living                  Review of Systems  Constitutional: Negative.   HENT: Negative.   Respiratory: Negative.   Cardiovascular: Positive for chest pain.  Gastrointestinal: Negative.   Musculoskeletal: Positive for myalgias and back pain.  Skin: Negative.   Neurological: Negative.   Psychiatric/Behavioral: Negative.   All other systems reviewed and are negative.    Allergies  Review of patient's allergies indicates no known allergies.  Home Medications   Current Outpatient Rx  Name  Route  Sig  Dispense  Refill  . aspirin EC 81 MG  EC tablet   Oral   Take 1 tablet (81 mg total) by mouth daily.   30 tablet   1   . metoprolol tartrate (LOPRESSOR) 12.5 mg TABS   Oral   Take 0.5 tablets (12.5 mg total) by mouth 2 (two) times daily.   60 tablet   2     BP 132/88  Pulse 81  Temp(Src) 98.8 F (37.1 C) (Oral)  Resp 18  SpO2 99%  Physical Exam  Nursing note and vitals reviewed. Constitutional: She appears well-developed and well-nourished.  HENT:  Head: Normocephalic and atraumatic.  Eyes: Conjunctivae are normal. Pupils are equal, round, and reactive to light.  Neck: Neck supple. No tracheal deviation present. No thyromegaly present.  Cardiovascular: Normal rate and regular rhythm.   No murmur heard. Pulmonary/Chest: Effort normal and breath sounds normal.  Abdominal: Soft. Bowel sounds are normal. She exhibits no distension. There is no tenderness.  Obese  Musculoskeletal: Normal range of motion. She exhibits no edema and no tenderness.  Has pain in bilateral arms on forcible abduction of shoulders area and complains of back pain and chest pain upon sitting up from a supine position.  Neurological: She is alert. No cranial nerve deficit. Coordination normal.  Gait normal  Skin: Skin is warm and dry. No rash noted.  Psychiatric: She has a normal mood and affect.    ED Course  Procedures (including critical care time)  Labs Reviewed  CBC WITH DIFFERENTIAL  COMPREHENSIVE METABOLIC PANEL  TROPONIN I   Dg Chest 2 View  09/04/2012  *RADIOLOGY REPORT*  Clinical Data: Generalized body aches.  Mid chest pain radiating into the back.  Bilateral arm numbness, tingling.  Hypertension.  CHEST - 2 VIEW  Comparison: 05/02/2012  Findings: The heart is mildly enlarged.  Aorta is mildly tortuous. There are no focal consolidations or pleural effusions.  No pulmonary edema.  Degenerative changes are seen in the thoracic spine.  IMPRESSION:  1.  Cardiomegaly. 2. No evidence for acute pulmonary abnormality.   Original  Report Authenticated By: Norva Pavlov, M.D.      Date: 09/04/2012  Rate: 85  Rhythm: normal sinus rhythm  QRS Axis: normal  Intervals: normal  ST/T Wave abnormalities: nonspecific T wave changes  Conduction Disutrbances:none  Narrative Interpretation:   Old EKG Reviewed: Unchanged from 05/13/2012 interpreted by me Results for orders placed during the hospital encounter of 09/04/12  CBC WITH DIFFERENTIAL      Result Value Range   WBC 10.9 (*) 4.0 - 10.5 K/uL   RBC 4.97  3.87 - 5.11 MIL/uL   Hemoglobin 13.4  12.0 - 15.0 g/dL   HCT 47.8  29.5 - 62.1 %   MCV 82.7  78.0 - 100.0 fL   MCH 27.0  26.0 - 34.0 pg   MCHC 32.6  30.0 - 36.0 g/dL   RDW 30.8  65.7 - 84.6 %   Platelets 312  150 - 400 K/uL   Neutrophils Relative 51  43 - 77 %   Neutro Abs 5.5  1.7 - 7.7 K/uL   Lymphocytes Relative 41  12 - 46 %   Lymphs Abs 4.4 (*) 0.7 - 4.0 K/uL   Monocytes Relative 7  3 - 12 %   Monocytes Absolute 0.7  0.1 - 1.0 K/uL   Eosinophils Relative 2  0 - 5 %   Eosinophils Absolute 0.2  0.0 - 0.7 K/uL   Basophils Relative 0  0 - 1 %   Basophils Absolute 0.0  0.0 - 0.1 K/uL  COMPREHENSIVE METABOLIC PANEL      Result Value Range   Sodium 138  135 - 145 mEq/L   Potassium 3.7  3.5 - 5.1 mEq/L   Chloride 104  96 - 112 mEq/L   CO2 27  19 - 32 mEq/L   Glucose, Bld 85  70 - 99 mg/dL   BUN 16  6 - 23 mg/dL   Creatinine, Ser 9.62  0.50 - 1.10 mg/dL   Calcium 9.4  8.4 - 95.2 mg/dL   Total Protein 7.8  6.0 - 8.3 g/dL   Albumin 3.5  3.5 - 5.2 g/dL   AST 22  0 - 37 U/L   ALT 29  0 - 35 U/L   Alkaline Phosphatase 91  39 - 117 U/L   Total Bilirubin 0.2 (*) 0.3 - 1.2 mg/dL   GFR calc non Af Amer >90  >90 mL/min   GFR calc Af Amer >90  >90 mL/min  TROPONIN I      Result Value Range   Troponin I <0.30  <0.30 ng/mL  CK      Result Value Range   Total CK 113  7 - 177 U/L  POCT I-STAT TROPONIN I      Result Value Range   Troponin i, poc 0.00  0.00 - 0.08 ng/mL   Comment 3  Dg Chest 2  View  09/04/2012  *RADIOLOGY REPORT*  Clinical Data: Generalized body aches.  Mid chest pain radiating into the back.  Bilateral arm numbness, tingling.  Hypertension.  CHEST - 2 VIEW  Comparison: 05/02/2012  Findings: The heart is mildly enlarged.  Aorta is mildly tortuous. There are no focal consolidations or pleural effusions.  No pulmonary edema.  Degenerative changes are seen in the thoracic spine.  IMPRESSION:  1.  Cardiomegaly. 2. No evidence for acute pulmonary abnormality.   Original Report Authenticated By: Norva Pavlov, M.D.     No diagnosis found.  7:25 PM patient's pain is much improved after treatment with Norco and ice pack  MDM  Symptoms and exam are consistent with myalgias. No evidence of rhabdomyolysis. I doubt strongly coronary etiology of symptoms. Symptoms are nonexertional. Brought on by changing position Plan prescription Norco Follow up [facility] as needed. Diagnosis myalgia       Doug Sou, MD 09/04/12 1930

## 2012-09-04 NOTE — ED Notes (Signed)
Patient c/o bilateral shoulder/chest/neck/lower back/bilateral foot pain since Thursday.  Patient has h/o degenerative discs.  Patient denies SOB, dizziness, weakness, diaphoresis, and N/V.

## 2012-09-21 ENCOUNTER — Emergency Department (HOSPITAL_COMMUNITY)
Admission: EM | Admit: 2012-09-21 | Discharge: 2012-09-21 | Disposition: A | Discharge status: Home / Self Care | Payer: Self-pay | Attending: Emergency Medicine | Admitting: Emergency Medicine

## 2012-09-21 ENCOUNTER — Encounter (HOSPITAL_COMMUNITY): Payer: Self-pay | Admitting: *Deleted

## 2012-09-21 DIAGNOSIS — R42 Dizziness and giddiness: Secondary | ICD-10-CM | POA: Insufficient documentation

## 2012-09-21 DIAGNOSIS — E669 Obesity, unspecified: Secondary | ICD-10-CM | POA: Insufficient documentation

## 2012-09-21 DIAGNOSIS — Z8719 Personal history of other diseases of the digestive system: Secondary | ICD-10-CM | POA: Insufficient documentation

## 2012-09-21 DIAGNOSIS — R51 Headache: Secondary | ICD-10-CM

## 2012-09-21 DIAGNOSIS — Z8669 Personal history of other diseases of the nervous system and sense organs: Secondary | ICD-10-CM | POA: Insufficient documentation

## 2012-09-21 DIAGNOSIS — G8929 Other chronic pain: Secondary | ICD-10-CM | POA: Insufficient documentation

## 2012-09-21 DIAGNOSIS — Z8679 Personal history of other diseases of the circulatory system: Secondary | ICD-10-CM | POA: Insufficient documentation

## 2012-09-21 DIAGNOSIS — Y9389 Activity, other specified: Secondary | ICD-10-CM | POA: Insufficient documentation

## 2012-09-21 DIAGNOSIS — Z79899 Other long term (current) drug therapy: Secondary | ICD-10-CM | POA: Insufficient documentation

## 2012-09-21 DIAGNOSIS — H538 Other visual disturbances: Secondary | ICD-10-CM | POA: Insufficient documentation

## 2012-09-21 DIAGNOSIS — M25519 Pain in unspecified shoulder: Secondary | ICD-10-CM | POA: Insufficient documentation

## 2012-09-21 DIAGNOSIS — R1110 Vomiting, unspecified: Secondary | ICD-10-CM | POA: Insufficient documentation

## 2012-09-21 DIAGNOSIS — S0990XA Unspecified injury of head, initial encounter: Secondary | ICD-10-CM | POA: Insufficient documentation

## 2012-09-21 DIAGNOSIS — Z8739 Personal history of other diseases of the musculoskeletal system and connective tissue: Secondary | ICD-10-CM | POA: Insufficient documentation

## 2012-09-21 HISTORY — DX: Other chronic pain: G89.29

## 2012-09-21 HISTORY — DX: Pain in unspecified shoulder: M25.519

## 2012-09-21 MED ORDER — IBUPROFEN 800 MG PO TABS: 800.0000 mg | ORAL_TABLET | Freq: Three times a day (TID) | ORAL | Status: DC | PRN

## 2012-09-21 MED ORDER — KETOROLAC TROMETHAMINE 60 MG/2ML IM SOLN
60.0000 mg | Freq: Once | INTRAMUSCULAR | Status: AC
  Administered 2012-09-21: 60 mg via INTRAMUSCULAR
  Filled 2012-09-21: qty 2

## 2012-09-21 NOTE — ED Notes (Signed)
Pt was getting into her car and smacked her head against the door. No loc, but pt became dizzy.  Presently c/o blurred vision, dizziness and headache to R side of head.  No hematoma or lac noted.  Pt ao x 4.

## 2012-09-21 NOTE — ED Provider Notes (Signed)
History    This chart was scribed for non-physician practitioner Junius Finner, PA-C working with Glynn Octave, MD by Gerlean Ren, ED Scribe. This patient was seen in room TR05C/TR05C and the patient's care was started at 6:48 PM.    CSN: [id number]  Arrival date & time 09/21/12  1713   None     Chief Complaint  Patient presents with  . Head Injury     The history is provided by the patient. No language interpreter was used.  Michele Gross is a 57 y.o. female who presents to the Emergency Department complaining of constant HA and head pain located to right side head where she hit her head against the door frame of her car while getting into the car.  Pt had one episode of non-bloody, non-bilious emesis at ED but has no lasting nausea.  Pt denies LOC and states she was mildly dizzy with blurred vision immediately after trauma that have both resolved.  Head pain has reduced since injury but HA has gradually worsened.   Past Medical History  Diagnosis Date  . Chest pain     Negative nuclear stress 2007, negative stress echo 2011, reported negative cath 2000; negative stress echo November 2013  . GERD (gastroesophageal reflux disease)   . Sleep apnea   . DJD (degenerative joint disease)   . Obesity   . Chronic shoulder pain     Past Surgical History  Procedure Laterality Date  . Partial hysterectomy      Family History  Problem Relation Age of Onset  . Cancer Father     colon and later "throat;" in remission    History  Substance Use Topics  . Smoking status: Never Smoker   . Smokeless tobacco: Not on file  . Alcohol Use: 1.2 oz/week    2 Glasses of wine per week     Comment: occassional    No OB history provided.   Review of Systems  HENT:       Positive head pain  Eyes: Negative for visual disturbance.  Neurological: Positive for dizziness (resolved) and headaches.  Psychiatric/Behavioral: Negative for confusion.  All other systems reviewed and are  negative.    Allergies  Acetaminophen  Home Medications   Current Outpatient Rx  Name  Route  Sig  Dispense  Refill  . aspirin EC 81 MG EC tablet   Oral   Take 1 tablet (81 mg total) by mouth daily.   30 tablet   1   . metoprolol tartrate (LOPRESSOR) 25 MG tablet   Oral   Take 12.5 mg by mouth 2 (two) times daily.         Marland Kitchen ibuprofen (ADVIL,MOTRIN) 800 MG tablet   Oral   Take 1 tablet (800 mg total) by mouth every 8 (eight) hours as needed for pain.   21 tablet   0     BP 117/95  Pulse 96  Temp(Src) 98.6 F (37 C) (Oral)  Resp 16  SpO2 100%  Physical Exam  Nursing note and vitals reviewed. Constitutional: She is oriented to person, place, and time. She appears well-developed and well-nourished. No distress.  HENT:  Head: Normocephalic.  Bump over right parietal region, no laceration noted  Eyes: EOM are normal. Pupils are equal, round, and reactive to light.  Neck: Neck supple. No tracheal deviation present.  Cardiovascular: Normal rate, regular rhythm and normal heart sounds.   No murmur heard. Pulmonary/Chest: Effort normal and breath sounds normal. No respiratory distress.  She has no wheezes.  Musculoskeletal: Normal range of motion.  Neurological: She is alert and oriented to person, place, and time. No cranial nerve deficit. Coordination normal.  Skin: Skin is warm and dry.  Psychiatric: She has a normal mood and affect. Her behavior is normal.    ED Course  Procedures (including critical care time) DIAGNOSTIC STUDIES: Oxygen Saturation is 100% on room air, normal by my interpretation.    COORDINATION OF CARE: 6:53 PM- Informed pt that I will discuss case with attending to determine if head scan is needed at this time.  Informed pt that I will provide pain relief regardless.  Pt understands plan and agrees.   1. Head injury, acute, initial encounter   2. Headache       MDM  Pt states she has a HA after hitting right side of head on car door  while getting in.  Was concerned because she was dizzy for a few minutes after.  Denies LOC.  Pt aox4. No hematoma or lac noted.   Neuro exam: CN II-XII in tact, PERRL, EOM in tact. Neg rhomberg, nl gait, nl coordination.   Dx: HA  Discuss with pt neuro exam was normal.  Pt stated she just felt like she has a headache now and does not need to be scanned.  Discussed with pt to have her husband watch her at home and return to ED if any worsening symptoms or further concerns arise.  Pt verbalized understanding and ageed with tx plan.   Discharged home. Rx: iburpofen   Vitals: unremarkable. Discharged in stable condition.    Discussed pt with attending during ED encounter.  I personally performed the services described in this documentation, which was scribed in my presence. The recorded information has been reviewed and is accurate.    Junius Finner, PA-C 09/22/12 0246

## 2012-09-22 NOTE — ED Provider Notes (Signed)
Medical screening examination/treatment/procedure(s) were performed by non-physician practitioner and as supervising physician I was immediately available for consultation/collaboration.  Glynn Octave, MD 09/22/12 1155

## 2013-03-08 ENCOUNTER — Other Ambulatory Visit (HOSPITAL_COMMUNITY): Payer: Self-pay | Admitting: Internal Medicine

## 2013-05-06 ENCOUNTER — Emergency Department (HOSPITAL_COMMUNITY)
Admission: EM | Admit: 2013-05-06 | Discharge: 2013-05-06 | Disposition: A | Discharge status: Home / Self Care | Payer: Self-pay | Attending: Emergency Medicine | Admitting: Emergency Medicine

## 2013-05-06 ENCOUNTER — Encounter (HOSPITAL_COMMUNITY): Payer: Self-pay | Admitting: Emergency Medicine

## 2013-05-06 ENCOUNTER — Emergency Department (HOSPITAL_COMMUNITY): Payer: Self-pay

## 2013-05-06 DIAGNOSIS — E669 Obesity, unspecified: Secondary | ICD-10-CM | POA: Insufficient documentation

## 2013-05-06 DIAGNOSIS — M79671 Pain in right foot: Secondary | ICD-10-CM

## 2013-05-06 DIAGNOSIS — M25579 Pain in unspecified ankle and joints of unspecified foot: Secondary | ICD-10-CM | POA: Insufficient documentation

## 2013-05-06 DIAGNOSIS — G8929 Other chronic pain: Secondary | ICD-10-CM | POA: Insufficient documentation

## 2013-05-06 DIAGNOSIS — Z8719 Personal history of other diseases of the digestive system: Secondary | ICD-10-CM | POA: Insufficient documentation

## 2013-05-06 DIAGNOSIS — Z8739 Personal history of other diseases of the musculoskeletal system and connective tissue: Secondary | ICD-10-CM | POA: Insufficient documentation

## 2013-05-06 DIAGNOSIS — Z7982 Long term (current) use of aspirin: Secondary | ICD-10-CM | POA: Insufficient documentation

## 2013-05-06 DIAGNOSIS — Z79899 Other long term (current) drug therapy: Secondary | ICD-10-CM | POA: Insufficient documentation

## 2013-05-06 MED ORDER — TRAMADOL HCL 50 MG PO TABS: 50.0000 mg | ORAL_TABLET | Freq: Four times a day (QID) | ORAL | Status: DC | PRN

## 2013-05-06 MED ORDER — METOPROLOL TARTRATE 25 MG PO TABS: 12.5000 mg | ORAL_TABLET | Freq: Two times a day (BID) | ORAL | Status: DC

## 2013-05-06 NOTE — ED Notes (Signed)
Pt with R foot pain.  Since Sunday pt has been experiencing R foot pain when she wakes up and steps out of bed.  The pain is increasing to the point where she cannot put pressure on her foot.  Denies injuring her foot.

## 2013-05-06 NOTE — ED Provider Notes (Signed)
CSN: [id number]     Arrival date & time 05/06/13  1821 History  This chart was scribed for non-physician practitioner, Sharilyn Sites, PA-C,working with Gerhard Munch, MD, by Karle Plumber, ED Scribe.  This patient was seen in room TR04C/TR04C and the patient's care was started at 8:23 PM.  Chief Complaint  Patient presents with  . Foot Pain   The history is provided by the patient. No language interpreter was used.   HPI Comments:  Michele Gross is a 57 y.o. female who presents to the Emergency Department complaining of constant, severe, new onset aching pain of right foot onset 5 days.  Pt states she awoke Sunday morning and her foot was hurting. Pt states bearing weight and walking makes her pain worse. She denies any recent injury or h/o any foot problems. Denies numbness or paresthesias of foot.  Pt also states she needs a refill of her metoprolol. Has been taking OTC pain relievers without noted improvement.  Past Medical History  Diagnosis Date  . Chest pain     Negative nuclear stress 2007, negative stress echo 2011, reported negative cath 2000; negative stress echo November 2013  . GERD (gastroesophageal reflux disease)   . Sleep apnea   . DJD (degenerative joint disease)   . Obesity   . Chronic shoulder pain    Past Surgical History  Procedure Laterality Date  . Partial hysterectomy     Family History  Problem Relation Age of Onset  . Cancer Father     colon and later "throat;" in remission   History  Substance Use Topics  . Smoking status: Never Smoker   . Smokeless tobacco: Not on file  . Alcohol Use: 1.2 oz/week    2 Glasses of wine per week     Comment: occassional   OB History   Grav Para Term Preterm Abortions TAB SAB Ect Mult Living                 Review of Systems  Musculoskeletal: Positive for arthralgias (right foot pain).  All other systems reviewed and are negative.    Allergies  Acetaminophen  Home Medications   Current Outpatient  Rx  Name  Route  Sig  Dispense  Refill  . aspirin EC 81 MG EC tablet   Oral   Take 1 tablet (81 mg total) by mouth daily.   30 tablet   1   . ibuprofen (ADVIL,MOTRIN) 800 MG tablet   Oral   Take 1 tablet (800 mg total) by mouth every 8 (eight) hours as needed for pain.   21 tablet   0   . metoprolol tartrate (LOPRESSOR) 25 MG tablet   Oral   Take 12.5 mg by mouth 2 (two) times daily.          Triage Vitals: BP 154/113  Pulse 75  Temp(Src) 99 F (37.2 C) (Oral)  Resp 16  Ht 5\' 6"  (1.676 m)  Wt 274 lb (124.286 kg)  BMI 44.25 kg/m2  SpO2 98% Physical Exam  Nursing note and vitals reviewed. Constitutional: She is oriented to person, place, and time. She appears well-developed and well-nourished. No distress.  HENT:  Head: Normocephalic and atraumatic.  Mouth/Throat: Oropharynx is clear and moist.  Eyes: Conjunctivae and EOM are normal. Pupils are equal, round, and reactive to light.  Neck: Normal range of motion.  Cardiovascular: Normal rate, regular rhythm and normal heart sounds.   Pulmonary/Chest: Effort normal and breath sounds normal. No respiratory distress. She  has no wheezes.  Musculoskeletal: Normal range of motion.       Right foot: She exhibits tenderness and bony tenderness. She exhibits normal range of motion, no swelling, normal capillary refill, no crepitus, no deformity and no laceration.  Right foot diffusely tender to palpation, limited range of motion of ankle due to pain; no bruising, abrasion, laceration, or other signs of trauma; overlying skin and normal in appearance; skin cool and dry; strong distal pulse and cap refill; sensation intact  Neurological: She is alert and oriented to person, place, and time.  Skin: Skin is warm and dry. She is not diaphoretic.  Psychiatric: She has a normal mood and affect.    ED Course  Procedures (including critical care time) DIAGNOSTIC STUDIES: Oxygen Saturation is 98% on RA, normal by my interpretation.    COORDINATION OF CARE: 8:25 PM- Will prescribe pain medications. Will give pt an orthopedic referral. Pt verbalizes understanding and agrees to plan.  Medications - No data to display  Labs Review Labs Reviewed - No data to display Imaging Review Dg Foot Complete Right  05/06/2013   CLINICAL DATA:  Right foot pain, no known injury  EXAM: RIGHT FOOT COMPLETE - 3+ VIEW  COMPARISON:  None.  FINDINGS: There is no fracture or dislocation in the foot. There is mild osteophytosis at the 1st MTP joint. No soft tissue abnormality. There is spurring of the calcaneus at the plantar and Achilles insertion  IMPRESSION: Mild degenerate change.   Electronically Signed   By: Genevive Bi M.D.   On: 05/06/2013 19:38    EKG Interpretation   None       MDM   1. Foot pain, right    X-ray negative for acute fracture or dislocation. pts foot without signs of acute injury or gout.  Patient given tramadol. She will followup with orthopedics if symptoms are not improved within one week.  Discussed plan with pt, they agreed.  Return precautions advised.  I personally performed the services described in this documentation, which was scribed in my presence. The recorded information has been reviewed and is accurate.  Garlon Hatchet, PA-C 05/06/13 2350

## 2013-05-06 NOTE — ED Provider Notes (Signed)
  Medical screening examination/treatment/procedure(s) were performed by non-physician practitioner and as supervising physician I was immediately available for consultation/collaboration.  EKG Interpretation   None         Hoyte Ziebell, MD 05/06/13 2357 

## 2013-07-07 ENCOUNTER — Emergency Department (HOSPITAL_COMMUNITY)
Admission: EM | Admit: 2013-07-07 | Discharge: 2013-07-07 | Disposition: A | Discharge status: Home / Self Care | Payer: No Typology Code available for payment source | Attending: Emergency Medicine | Admitting: Emergency Medicine

## 2013-07-07 ENCOUNTER — Emergency Department (HOSPITAL_COMMUNITY): Payer: No Typology Code available for payment source

## 2013-07-07 ENCOUNTER — Encounter (HOSPITAL_COMMUNITY): Payer: Self-pay | Admitting: Emergency Medicine

## 2013-07-07 DIAGNOSIS — M25519 Pain in unspecified shoulder: Secondary | ICD-10-CM | POA: Insufficient documentation

## 2013-07-07 DIAGNOSIS — J40 Bronchitis, not specified as acute or chronic: Secondary | ICD-10-CM

## 2013-07-07 DIAGNOSIS — J3489 Other specified disorders of nose and nasal sinuses: Secondary | ICD-10-CM | POA: Insufficient documentation

## 2013-07-07 DIAGNOSIS — G8929 Other chronic pain: Secondary | ICD-10-CM | POA: Insufficient documentation

## 2013-07-07 DIAGNOSIS — Z7982 Long term (current) use of aspirin: Secondary | ICD-10-CM | POA: Insufficient documentation

## 2013-07-07 DIAGNOSIS — M1990 Unspecified osteoarthritis, unspecified site: Secondary | ICD-10-CM | POA: Insufficient documentation

## 2013-07-07 DIAGNOSIS — J209 Acute bronchitis, unspecified: Secondary | ICD-10-CM | POA: Insufficient documentation

## 2013-07-07 DIAGNOSIS — R51 Headache: Secondary | ICD-10-CM | POA: Insufficient documentation

## 2013-07-07 DIAGNOSIS — J069 Acute upper respiratory infection, unspecified: Secondary | ICD-10-CM

## 2013-07-07 MED ORDER — METOPROLOL TARTRATE 25 MG PO TABS: 12.5000 mg | ORAL_TABLET | Freq: Two times a day (BID) | ORAL | Status: DC

## 2013-07-07 MED ORDER — ALBUTEROL SULFATE HFA 108 (90 BASE) MCG/ACT IN AERS
2.0000 | INHALATION_SPRAY | Freq: Once | RESPIRATORY_TRACT | Status: AC
  Administered 2013-07-07: 2 via RESPIRATORY_TRACT
  Filled 2013-07-07: qty 6.7

## 2013-07-07 MED ORDER — AZITHROMYCIN 250 MG PO TABS: 250.0000 mg | ORAL_TABLET | Freq: Every day | ORAL | Status: DC

## 2013-07-07 NOTE — Progress Notes (Signed)
[facility] provided pt with a list of primary care resources and [facility] information. Patient stated that she was pending Medicaid.

## 2013-07-07 NOTE — ED Provider Notes (Signed)
CSN: [id number]     Arrival date & time 07/07/13  1042 History   First MD Initiated Contact with Patient 07/07/13 1049     Chief Complaint  Patient presents with  . Cough   (Consider location/radiation/quality/duration/timing/severity/associated sxs/prior Treatment) The history is provided by the patient. No language interpreter was used.  Michele Gross is a 58 y/o F with PMHx of chest pain (negative nuclear stress test in 2007, negative cath in 2000, negative stress echo 04/2012 - followed by [facility]), GERD, sleep apnea, DJD, obesity, chronic shoulder pain presenting to the ED with productive cough, nasal congestion, sneezing that has been ongoing for the past week. Patient reported that when she coughs she has a yellowish colored phlegm. Stated that when she blows her nose she has clear rhinorrhea. Stated that she has been using Alka seltzer with minimal relief. Patient reported that the OTC medication has not been helping. Patient stated she ran out of her blood pressure medication and is requesting a refill - Metoprolol - stated that she no longer goes to Dr. Tedra [place] office and is seeking another PCP. Denied fever, chills, sore throat, difficulty swallowing, chest pain, shortness of breath, difficulty breathing, abdominal pain, nausea, vomiting, diarrhea. Denied flu vaccine. 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  PCP none [facility]  Past Medical History  Diagnosis Date  . Chest pain     Negative nuclear stress 2007, negative stress echo 2011, reported negative cath 2000; negative stress echo November 2013  . GERD (gastroesophageal reflux disease)   . Sleep apnea   . DJD (degenerative joint disease)   . Obesity   . Chronic shoulder pain    Past Surgical History  Procedure Laterality Date  . Partial hysterectomy     Family History  Problem Relation Age of Onset  . Cancer Father     colon and later "throat;" in remission   History  Substance Use Topics  .  Smoking status: Never Smoker   . Smokeless tobacco: Not on file  . Alcohol Use: 1.2 oz/week    2 Glasses of wine per week     Comment: occassional   OB History   Grav Para Term Preterm Abortions TAB SAB Ect Mult Living                 Review of Systems  Constitutional: Negative for fever and chills.  HENT: Positive for congestion and sinus pressure. Negative for sore throat and trouble swallowing.   Respiratory: Positive for cough. Negative for shortness of breath.   Cardiovascular: Negative for chest pain.  Gastrointestinal: Negative for nausea, vomiting, abdominal pain and diarrhea.  Musculoskeletal: Negative for back pain, neck pain and neck stiffness.  Neurological: Positive for headaches. Negative for weakness.  All other systems reviewed and are negative.    Allergies  Acetaminophen  Home Medications   Current Outpatient Rx  Name  Route  Sig  Dispense  Refill  . aspirin EC 81 MG EC tablet   Oral   Take 1 tablet (81 mg total) by mouth daily.   30 tablet   1   . Chlorphen-Phenyleph-ASA (ALKA-SELTZER PLUS COLD PO)   Oral   Take 2 capsules by mouth daily.         . metoprolol tartrate (LOPRESSOR) 25 MG tablet   Oral   Take 12.5 mg by mouth 2 (two) times daily.         Marland Kitchen azithromycin (ZITHROMAX) 250 MG tablet   Oral  Take 1 tablet (250 mg total) by mouth daily. Take first 2 tablets together, then 1 every day until finished.   6 tablet   0   . metoprolol tartrate (LOPRESSOR) 25 MG tablet   Oral   Take 0.5 tablets (12.5 mg total) by mouth 2 (two) times daily.   30 tablet   0    BP 122/77  Pulse 108  Temp(Src) 98.8 F (37.1 C)  Resp 20  SpO2 97% Physical Exam  Nursing note and vitals reviewed. Constitutional: She is oriented to person, place, and time. She appears well-developed and well-nourished. No distress.  HENT:  Head: Normocephalic and atraumatic.  Right Ear: External ear normal.  Left Ear: External ear normal.  Mouth/Throat: Oropharynx  is clear and moist. No oropharyngeal exudate.  Negative swelling, erythema, inflammation, lesions, sores, petechiae, exudate noted to the posterior oropharynx and bilateral tonsils. Negative uvula swelling. Uvula midline, symmetrical elevation.  Negative pain upon palpation to the maxillary and frontal sinuses  Nasal congestion noted. Nasal patency identified bilaterally to nostrils.  Eyes: Conjunctivae and EOM are normal. Pupils are equal, round, and reactive to light. Right eye exhibits no discharge. Left eye exhibits no discharge.  Neck: Normal range of motion. Neck supple. No tracheal deviation present.  Negative neck stiffness Negative nuchal rigidity Negative cervical lymphadenopathy  Cardiovascular: Normal rate, regular rhythm and normal heart sounds.  Exam reveals no friction rub.   No murmur heard. Pulses:      Radial pulses are 2+ on the right side, and 2+ on the left side.  Pulmonary/Chest: Effort normal and breath sounds normal. No respiratory distress. She has no wheezes. She has no rales. She exhibits no tenderness.  Negative respiratory distress Patient able to speak in full sentences without difficulty Good lung expansion  Musculoskeletal: Normal range of motion.  Full ROM to upper and lower extremities without difficulty noted, negative ataxia noted.  Lymphadenopathy:    She has no cervical adenopathy.  Neurological: She is alert and oriented to person, place, and time. She exhibits normal muscle tone. Coordination normal.  Skin: Skin is warm and dry. No rash noted. She is not diaphoretic. No erythema.  Psychiatric: She has a normal mood and affect. Her behavior is normal. Thought content normal.    ED Course  Procedures (including critical care time)  Dg Chest 2 View  07/07/2013   CLINICAL DATA:  Cough  EXAM: CHEST  2 VIEW  COMPARISON:  09/04/2012  FINDINGS: Low lung volumes. Cardiac silhouette mild to moderately enlarged. Aorta is tortuous. Lungs are clear.  Degenerative changes appreciated within the mid thoracic spine.  IMPRESSION: No active cardiopulmonary disease.   Electronically Signed   By: Salome Holmes M.D.   On: 07/07/2013 11:42   Labs Review Labs Reviewed - No data to display Imaging Review Dg Chest 2 View  07/07/2013   CLINICAL DATA:  Cough  EXAM: CHEST  2 VIEW  COMPARISON:  09/04/2012  FINDINGS: Low lung volumes. Cardiac silhouette mild to moderately enlarged. Aorta is tortuous. Lungs are clear. Degenerative changes appreciated within the mid thoracic spine.  IMPRESSION: No active cardiopulmonary disease.   Electronically Signed   By: Salome Holmes M.D.   On: 07/07/2013 11:42    EKG Interpretation   None       MDM   1. Bronchitis   2. URI, acute     Filed Vitals:   07/07/13 1047 07/07/13 1207  BP: 122/77   Pulse: 108 108  Temp: 98.8 F (37.1  C)   Resp: 20   SpO2: 98% 97%    Patient presenting to the ED with productive cough, nasal congestion, sneezing, headache, sinus pressure that has been ongoing for the past week with no relief with OTC medications. 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  Alert and oriented. GCS 15. Heart rate and rhythm normal. Lungs clear to auscultation to upper lower lobes bilaterally-good lung expansion-patient is able to speak in full sentences without difficulty. Negative use of accessory muscles, negative signs of respiratory distress. Negative neck stiffness, negative nuchal rigidity, negative cervical lymphadenopathy. Unremarkable oral exam-negative findings for pharyngitis. Negative uvula swelling. Negative discomfort upon palpation to maxillary and frontal sinuses. Ear and eye exam unremarkable. Chest x-ray negative for acute cardiopulmonary disease. Doubt meningitis. Doubt pneumonia. Suspicion to be upper respiratory infection, possible bronchitis. Patient stable, afebrile. Discharged patient. Discharged patient with antibiotics and inhaler. [facility]. Discussed with patient to rest and stay hydrated. Discussed with patient to closely monitor symptoms and if symptoms are to worsen or change to report back to the ED - strict return instructions given.  Patient agreed to plan of care, understood, all questions answered.    Raymon MuttonMarissa Herberto Ledwell, PA-C 07/08/13 1301

## 2013-07-07 NOTE — ED Notes (Signed)
Pt c/o cough; congestion; headache x 1 wk; has been taken theraflu

## 2013-07-07 NOTE — Discharge Instructions (Signed)
Please call and set-up an appointment with [facility] regarding symptoms and to re-evaluated Please take medications as prescribed-on a full stomach Please take inhaler as needed for shortness of breath or difficulty breathing Please avoid any physical or strenuous activity Please rest and stay hydrated Please continue monitor symptoms closely if symptoms are to worsen or change (fever greater than 101, chills, neck pain, neck stiffness, chest pain, shortness of breath, difficulty breathing, worsening symptoms) please report back to emergency apartment immediately   Bronchitis Bronchitis is swelling (inflammation) of the air tubes leading to your lungs (bronchi). This causes mucus and a cough. If the swelling gets bad, you may have trouble breathing. HOME CARE   Rest.  Drink enough fluids to keep your pee (urine) clear or pale yellow (unless you have a condition where you have to watch how much you drink).  Only take medicine as told by your doctor. If you were given antibiotic medicines, finish them even if you start to feel better.  Avoid smoke, irritating chemicals, and strong smells. These make the problem worse. Quit smoking if you smoke. This helps your lungs heal faster.  Use a cool mist humidifier. Change the water in the humidifier every day. You can also sit in the bathroom with hot shower running for 5 10 minutes. Keep the door closed.  See your health care provider as told.  Wash your hands often. GET HELP IF: Your problems do not get better after 1 week. GET HELP RIGHT AWAY IF:   Your fever gets worse.  You have chills.  Your chest hurts.  Your problems breathing get worse.  You have blood in your mucus.  You pass out (faint).  You feel lightheaded.  You have a bad headache.  You throw up (vomit) again and again. MAKE SURE YOU:  Understand these instructions.  Will watch your condition.  Will get help right away if you are not doing  well or get worse. Document Released: 11/26/2007 Document Revised: 03/30/2013 Document Reviewed: 02/01/2013 [facility] Patient Information 2014 [employer], [place]. Upper Respiratory Infection, Adult An upper respiratory infection (URI) is also known as the common cold. It is often caused by a type of germ (virus). Colds are easily spread (contagious). You can pass it to others by kissing, coughing, sneezing, or drinking out of the same glass. Usually, you get better in 1 or 2 weeks.  HOME CARE   Only take medicine as told by your doctor.  Use a warm mist humidifier or breathe in steam from a hot shower.  Drink enough water and fluids to keep your pee (urine) clear or pale yellow.  Get plenty of rest.  Return to work when your temperature is back to normal or as told by your doctor. You may use a face mask and wash your hands to stop your cold from spreading. GET HELP RIGHT AWAY IF:   After the first few days, you feel you are getting worse.  You have questions about your medicine.  You have chills, shortness of breath, or brown or red spit (mucus).  You have yellow or brown snot (nasal discharge) or pain in the face, especially when you bend forward.  You have a fever, puffy (swollen) neck, pain when you swallow, or white spots in the back of your throat.  You have a bad headache, ear pain, sinus pain, or chest pain.  You have a high-pitched whistling sound when you breathe in and out (wheezing).  You have a lasting  cough or cough up blood.  You have sore muscles or a stiff neck. MAKE SURE YOU:   Understand these instructions.  Will watch your condition.  Will get help right away if you are not doing well or get worse. Document Released: 11/26/2007 Document Revised: 09/01/2011 Document Reviewed: 10/14/2010 [facility] Patient Information 2014 [place], [place].   Emergency Department Resource Guide 1) Find a Doctor and Pay Out of Pocket Although you won't have to find out who is  covered by your insurance plan, it is a good idea to ask around and get recommendations. You will then need to call the office and see if the doctor you have chosen will accept you as a new patient and what types of options they offer for patients who are self-pay. Some doctors offer discounts or will set up payment plans for their patients who do not have insurance, but you will need to ask so you aren't surprised when you get to your appointment.  2) Contact Your Local Health Department Not all health departments have doctors that can see patients for sick visits, but many do, so it is worth a call to see if yours does. If you don't know where your local health department is, you can check in your phone book. The CDC also has a tool to help you locate your state's health department, and many state websites also have listings of all of their local health departments.  3) Find a Walk-in Clinic If your illness is not likely to be very severe or complicated, you may want to try a walk in clinic. These are popping up all over the country in pharmacies, drugstores, and shopping centers. They're usually staffed by nurse practitioners or physician assistants that have been trained to treat common illnesses and complaints. They're usually fairly quick and inexpensive. However, if you have serious medical issues or chronic medical problems, these are probably not your best option.  No Primary Care Doctor: - Call Health Connect at  [phone] - they can help you locate a primary care doctor that  accepts your insurance, provides certain services, etc. - Physician Referral Service- [phone]  Chronic Pain Problems: Organization         Address  Phone   Notes  [facility]  [phone] Patients need to be referred by their primary care doctor.   Medication Assistance: Organization         Address  Phone   Notes  [facility] Medication [facility] [address] [place] [place], [place] [address] [phone] --Must be a resident of [facility] -- Must have NO insurance coverage whatsoever (no Medicaid/ Medicare, etc.) -- The pt. MUST have a primary care doctor that directs their care regularly and follows them in the community   [facility]  [phone]   [employer]  [phone]    Agencies that provide inexpensive medical care: Organization         Address  Phone   Notes  [facility]  [phone]   [facility]    [phone]   [facility] [address] [place], [place] [address] [phone]   [facility] [address] [place] [place] [phone]   Planned Parenthood    [phone]   [facility]    [phone]   [facility]  [address], [place] Phone:  [phone], Fax:  [phone] Hours of Operation:  9 am - 6 pm, M-F.  Also accepts Medicaid/Medicare and self-pay.  [facility] for [place], [address], [place] Phone: [phone], Fax: [phone]. Hours of Operation:  8:30 am - 5:30 pm, M-F.  Also accepts Medicaid and self-pay.  [facility] [address] [place] Phone: [phone]   [place], [place], [place] [phone], Ext. [phone] Mondays & Thursdays: 7-9 AM.  First 15 patients are seen on a first come, first serve basis.    [place] Providers:  Organization         Address  Phone   Notes  [facility] [address], [place] [phone] Also accepts self-pay patients.  [facility] [id number] [place], [place]  [phone]   [place], [address], [place] [phone]   [facility] [address] [place] [phone]   Lucianne Lei [address], [place]   [phone] Only accepts [place] Access [place] patients after they have their name applied to their card.   Self-Pay (no insurance) in [facility]:  Organization         Address  Phone   Notes  Sickle Cell Patients, [facility] Internal Medicine [place] [phone]   [facility] Urgent Care [place] [phone]   [facility] [place]  [place], [place], [place] [phone]   [facility]/Dr. Osei-Bonsu  [address], [place] or [address], [place] [phone] Phone number for both [place] and [place] locations is the same.  Urgent Medical and [facility] [address], [place] [phone]   [facility] [address] [place] or [address] [phone]   [facility] [address], [place] [phone], phone; [phone], fax Sees patients 1st and 3rd Saturday of every month.  Must not qualify for public or private insurance (i.e. Medicaid, Medicare, [place] Health Choice, Veterans' Benefits)  Household income should be no more than 200% of the poverty level The clinic cannot treat you if you are pregnant or think you are pregnant  Sexually transmitted diseases are not treated at the clinic.    Dental Care: Organization         Address  Phone  Notes  [facility] Department of [facility] [place] [phone] Accepts children up to age 64 who are enrolled in [place] or [place]; pregnant women with a Medicaid card; and children who have applied for Medicaid or [place] Health Choice, but were declined, whose parents can pay a reduced fee at time of service.  [facility] Department of [facility]  [address], [place] [phone] Accepts children up to age 60 who are enrolled  in [place] or [place]; pregnant women with a Medicaid card; and children who have applied for Medicaid or [place] Health Choice, but were declined, whose parents can pay a reduced fee at time of service.  [facility] Adult [facility] Access PROGRAM  [place] [phone] Patients are seen by appointment only. Walk-ins are not accepted. [facility] will see patients 45 years of age and older. Monday - Tuesday (8am-5pm) Most Wednesdays (8:30-5pm) $30 per visit, cash only  [facility] Adult Dental Access PROGRAM  [address], [facility] [phone] Patients are seen by appointment only. Walk-ins are not accepted. [place] will see patients 54 years of age and older. One Wednesday Evening (Monthly: Volunteer Based).  $30 per visit, cash only  [place]  [phone] for adults; Children under age 65, call Graduate Pediatric Dentistry at [phone]. Children aged 41-14, please call [phone] to request a pediatric application.  Dental services are provided in all areas of dental care including fillings, crowns and bridges, complete and partial dentures, implants, gum treatment, root canals, and extractions. Preventive care is also provided. Treatment is provided to both adults and children. Patients are selected via a lottery and there is often a waiting list.   [facility] [address] [place]  [phone] [email or web address]   [facility] [address] [place], [place] [phone], Ext. [phone] Second and Fourth Thursday of each month, opens at 6:30 AM; Clinic ends at 9 AM.  Patients are seen on a first-come first-served basis, and a limited number are seen during each clinic.   [facility]  [address] Hillard Danker [place], [place] [phone]   Eligibility Requirements You must have lived in [place], [place], or [place] counties for at least the last three months.   You cannot be  eligible for state or federal sponsored [employer], including [employer], [place], or [employer].   You generally cannot be eligible for healthcare insurance through your employer.    How to apply: Eligibility screenings are held every Tuesday and Wednesday afternoon from 1:00 pm until 4:00 pm. You do not need an appointment for the interview!  [facility] [address] [place], [place]   [place]  [place] Department  [place]  [phone]    Behavioral Health Resources in the Community: Intensive Outpatient Programs Organization         Address  Phone  Notes  [place]. [address], [place], [place] [phone]   [facility] Outpatient [address] [place], [place]   ADS: Alcohol & Drug Svcs [address] [place], [place]   [place] [address],  [place], [place] or [phone]   Substance Abuse Resources Organization         Address  Phone  Notes  Alcohol and Drug Services  [phone]   [place]  [phone]   [facility] [place]   Chinita Pester  [phone]   Residential & Outpatient Substance Abuse Program  [phone]   Psychological Services Organization         Address  Phone  Notes  [facility] [place]  [place]  [phone]   [place] [address], [place] or [phone]    Mobile Crisis Teams Organization         Address  Phone  Notes  [facility], Mobile Crisis Care Unit  [phone]   [facility] Psychotherapeutic [facility]  [address] [place], [place]   [facility] [address] [place] [phone]    Self-Help/Support Groups Organization  Address  Phone             Notes  [place] [place] - variety of support groups  [place] Call for more information  Narcotics Anonymous (NA), [facility] [address], [employer] [place]  2 meetings at this location   Special educational needs teacher         Address  Phone  Notes  ASAP Residential Treatment [place],    [place]  [phone]   [facility]  [address], [place] [id number], [place], [place]   [place], [place] [phone] Admissions: 8am-3pm M-F  Incentives Substance [place] [address]    [place], [place] [phone]   [facility] [address] [place], [place], [place]   [facility] [address]  [place], [place]   [facility] Programs - Intensive Outpatient [address], [place], [place]   [facility] ([place] [address]  [place], [place] [phone] or [phone]   [facility] Services (RTS) [address], [place], [place] Accepts Medicaid  [facility] [place] [address],  [place] [phone] Substance Abuse/Addiction Treatment   [facility] Organization         Address  Phone  Notes  [facility]  [phone]   Domenic Schwab, PhD [address] Arlis [place], [place]   [phone] or [phone]   [facility] [place], [place] [phone]   [facility] [address] [place], [place] [phone] Insurance/Medicaid/sponsorship through [facility] and Families [address] [place]                                    [place], [place] [phone] [place] [address], [place] [phone]    Dr. Adele Schilder  [phone]   [facility] Dept. 1) [address] [place] 2) [place] 3)  [place] [address], [place] [phone]  [phone]   [place] [phone] or [phone] (After Hours)

## 2013-07-09 NOTE — ED Provider Notes (Signed)
Medical screening examination/treatment/procedure(s) were performed by non-physician practitioner and as supervising physician I was immediately available for consultation/collaboration.  EKG Interpretation   None        Leodan Bolyard F Lakethia Coppess, MD 07/09/13 1936 

## 2013-09-03 ENCOUNTER — Emergency Department (HOSPITAL_COMMUNITY): Payer: No Typology Code available for payment source

## 2013-09-03 ENCOUNTER — Encounter (HOSPITAL_COMMUNITY): Payer: Self-pay | Admitting: Emergency Medicine

## 2013-09-03 ENCOUNTER — Emergency Department (HOSPITAL_COMMUNITY)
Admission: EM | Admit: 2013-09-03 | Discharge: 2013-09-03 | Disposition: A | Discharge status: Home / Self Care | Payer: No Typology Code available for payment source | Attending: Emergency Medicine | Admitting: Emergency Medicine

## 2013-09-03 DIAGNOSIS — Z888 Allergy status to other drugs, medicaments and biological substances status: Secondary | ICD-10-CM | POA: Insufficient documentation

## 2013-09-03 DIAGNOSIS — R0602 Shortness of breath: Secondary | ICD-10-CM | POA: Insufficient documentation

## 2013-09-03 DIAGNOSIS — K219 Gastro-esophageal reflux disease without esophagitis: Secondary | ICD-10-CM | POA: Insufficient documentation

## 2013-09-03 DIAGNOSIS — G8929 Other chronic pain: Secondary | ICD-10-CM | POA: Insufficient documentation

## 2013-09-03 DIAGNOSIS — E669 Obesity, unspecified: Secondary | ICD-10-CM | POA: Insufficient documentation

## 2013-09-03 DIAGNOSIS — Z79899 Other long term (current) drug therapy: Secondary | ICD-10-CM | POA: Insufficient documentation

## 2013-09-03 DIAGNOSIS — G4730 Sleep apnea, unspecified: Secondary | ICD-10-CM | POA: Insufficient documentation

## 2013-09-03 DIAGNOSIS — M1990 Unspecified osteoarthritis, unspecified site: Secondary | ICD-10-CM | POA: Insufficient documentation

## 2013-09-03 DIAGNOSIS — R079 Chest pain, unspecified: Secondary | ICD-10-CM | POA: Insufficient documentation

## 2013-09-03 DIAGNOSIS — Z7982 Long term (current) use of aspirin: Secondary | ICD-10-CM | POA: Insufficient documentation

## 2013-09-03 DIAGNOSIS — M25519 Pain in unspecified shoulder: Secondary | ICD-10-CM | POA: Insufficient documentation

## 2013-09-03 LAB — CBC WITH DIFFERENTIAL/PLATELET
BASOS ABS: 0 10*3/uL (ref 0.0–0.1)
BASOS PCT: 0 % (ref 0–1)
Eosinophils Absolute: 0.1 10*3/uL (ref 0.0–0.7)
Eosinophils Relative: 1 % (ref 0–5)
HCT: 40.4 % (ref 36.0–46.0)
Hemoglobin: 13.4 g/dL (ref 12.0–15.0)
Lymphocytes Relative: 41 % (ref 12–46)
Lymphs Abs: 4.1 10*3/uL — ABNORMAL HIGH (ref 0.7–4.0)
MCH: 27.3 pg (ref 26.0–34.0)
MCHC: 33.2 g/dL (ref 30.0–36.0)
MCV: 82.4 fL (ref 78.0–100.0)
Monocytes Absolute: 0.7 10*3/uL (ref 0.1–1.0)
Monocytes Relative: 7 % (ref 3–12)
NEUTROS ABS: 5.2 10*3/uL (ref 1.7–7.7)
Neutrophils Relative %: 51 % (ref 43–77)
Platelets: 319 10*3/uL (ref 150–400)
RBC: 4.9 MIL/uL (ref 3.87–5.11)
RDW: 15.5 % (ref 11.5–15.5)
WBC: 10.1 10*3/uL (ref 4.0–10.5)

## 2013-09-03 LAB — BASIC METABOLIC PANEL
BUN: 14 mg/dL (ref 6–23)
CHLORIDE: 103 meq/L (ref 96–112)
CO2: 24 mEq/L (ref 19–32)
CREATININE: 0.59 mg/dL (ref 0.50–1.10)
Calcium: 9.4 mg/dL (ref 8.4–10.5)
GFR calc non Af Amer: >90 mL/min (ref >90)
Glucose, Bld: 107 mg/dL — ABNORMAL HIGH (ref 70–99)
POTASSIUM: 3.8 meq/L (ref 3.7–5.3)
Sodium: 141 mEq/L (ref 137–147)

## 2013-09-03 LAB — TROPONIN I

## 2013-09-03 MED ORDER — METOPROLOL TARTRATE 25 MG PO TABS: 25.0000 mg | ORAL_TABLET | Freq: Two times a day (BID) | ORAL | Status: DC

## 2013-09-03 NOTE — ED Notes (Signed)
Pt presents to department for evaluation of midsternal chest pain radiating to back. Also states SOB. Ongoing x1 week. 8/10 pain upon arrival, describes as constant pressure. Pt is alert and oriented x4.

## 2013-09-03 NOTE — ED Provider Notes (Signed)
CSN: [id number]     Arrival date & time 09/03/13  [id number] History   First MD Initiated Contact with Patient 09/03/13 [phone]     Chief Complaint  Patient presents with  . Chest Pain  . Shortness of Breath     (Consider location/radiation/quality/duration/timing/severity/associated sxs/prior Treatment) HPI Comments: Patient is a 58 year old female with history of hypertension but no prior cardiac history. He presents today with complaints of tightness in her chest that has been ongoing for the past week. This is located in the center of her chest and both sides. She denies any productive cough, high fever. She does feel some shortness of breath. She tells me that she had a respiratory infection a month or 2 ago and "has not been right since".  Patient is a 58 y.o. female presenting with chest pain and shortness of breath. The history is provided by the patient.  Chest Pain Pain location:  Substernal area, L lateral chest and R lateral chest Pain quality: tightness   Pain radiates to:  Does not radiate Pain radiates to the back: no   Pain severity:  Mild Onset quality:  Gradual Duration:  1 week Timing:  Constant Progression:  Worsening Chronicity:  New Context: not breathing and no movement   Relieved by:  Nothing Worsened by:  Nothing tried Ineffective treatments:  None tried Associated symptoms: shortness of breath   Shortness of Breath Associated symptoms: chest pain     Past Medical History  Diagnosis Date  . Chest pain     Negative nuclear stress 2007, negative stress echo 2011, reported negative cath 2000; negative stress echo November 2013  . GERD (gastroesophageal reflux disease)   . Sleep apnea   . DJD (degenerative joint disease)   . Obesity   . Chronic shoulder pain    Past Surgical History  Procedure Laterality Date  . Partial hysterectomy     Family History  Problem Relation Age of Onset  . Cancer Father     colon and later "throat;" in remission   History   Substance Use Topics  . Smoking status: Never Smoker   . Smokeless tobacco: Not on file  . Alcohol Use: 1.2 oz/week    2 Glasses of wine per week     Comment: occassional   OB History   Grav Para Term Preterm Abortions TAB SAB Ect Mult Living                 Review of Systems  Respiratory: Positive for shortness of breath.   Cardiovascular: Positive for chest pain.  All other systems reviewed and are negative.      Allergies  Acetaminophen  Home Medications   Current Outpatient Rx  Name  Route  Sig  Dispense  Refill  . aspirin EC 81 MG EC tablet   Oral   Take 1 tablet (81 mg total) by mouth daily.   30 tablet   1   . azithromycin (ZITHROMAX) 250 MG tablet   Oral   Take 1 tablet (250 mg total) by mouth daily. Take first 2 tablets together, then 1 every day until finished.   6 tablet   0   . Chlorphen-Phenyleph-ASA (ALKA-SELTZER PLUS COLD PO)   Oral   Take 2 capsules by mouth daily.         . metoprolol tartrate (LOPRESSOR) 25 MG tablet   Oral   Take 12.5 mg by mouth 2 (two) times daily.         .Marland Kitchen  metoprolol tartrate (LOPRESSOR) 25 MG tablet   Oral   Take 0.5 tablets (12.5 mg total) by mouth 2 (two) times daily.   30 tablet   0    BP 152/90  Pulse 87  Temp(Src) 98.7 F (37.1 C) (Oral)  Resp 20  Ht 5\' 3"  (1.6 m)  Wt 270 lb (122.471 kg)  BMI 47.84 kg/m2  SpO2 98% Physical Exam  Nursing note and vitals reviewed. Constitutional: She is oriented to person, place, and time. She appears well-developed and well-nourished. No distress.  HENT:  Head: Normocephalic and atraumatic.  Neck: Normal range of motion. Neck supple.  Cardiovascular: Normal rate and regular rhythm.  Exam reveals no gallop and no friction rub.   No murmur heard. Pulmonary/Chest: Effort normal and breath sounds normal. No respiratory distress. She has no wheezes.  Abdominal: Soft. Bowel sounds are normal. She exhibits no distension. There is no tenderness.  Musculoskeletal:  Normal range of motion. She exhibits no edema.  Neurological: She is alert and oriented to person, place, and time.  Skin: Skin is warm and dry. She is not diaphoretic.    ED Course  Procedures (including critical care time) Labs Review Labs Reviewed  CBC WITH DIFFERENTIAL  BASIC METABOLIC PANEL  TROPONIN I   Imaging Review No results found.   EKG Interpretation   Date/Time:  Saturday September 03 2013 07:25:29 EDT Ventricular Rate:  85 PR Interval:  138 QRS Duration: 86 QT Interval:  370 QTC Calculation: 440 R Axis:   41 Text Interpretation:  Normal sinus rhythm with sinus arrhythmia RSR' or QR  pattern in V1 suggests right ventricular conduction delay ST \\T \ T wave  abnormality, consider inferior ischemia Abnormal ECG No significant change  since 09/04/2012 Confirmed by DELOS  MD, Maeley Matton ([id number]) on 09/03/2013  7:41:09 AM      MDM   Final diagnoses:  None    Patient is a 58 year old female with no prior cardiac history. She presents with complaints of chest discomfort that has been ongoing for the past week. She states she has not felt quite right since being sick a month ago with a URI. Workup today reveals an unchanged EKG, negative troponin, and chest x-ray which is unremarkable. I am uncertain as to the exact nature of her symptoms however this does not appear cardiac. Her chest x-ray does not reveal an infiltrate or pneumothorax. At this point, I do not feel as though patient requires admission. She will be discharged with anti-inflammatories, and when necessary followup. Reviewing her note reveals a negative stress echo in 2013. She understands to return if her symptoms worsen or change.    Geoffery Lyons, MD 09/03/13 [phone]

## 2013-09-03 NOTE — ED Notes (Signed)
Pt presents with mid-sternum chest pain radiating to her back abd SOB for 7-10 days. Pt states she has had 2 URI in the last month and has been trying to treat it at home. Pt denies a productive cough, fever, chills, nausea, or vomiting

## 2013-09-03 NOTE — Discharge Instructions (Signed)
Ibuprofen 600 mg three times daily for the next 5 days.    Return to the ER if your symptoms substantially worsen or he develop other new and concerning symptoms.   Chest Pain (Nonspecific) It is often hard to give a specific diagnosis for the cause of chest pain. There is always a chance that your pain could be related to something serious, such as a heart attack or a blood clot in the lungs. You need to follow up with your caregiver for further evaluation. CAUSES   Heartburn.  Pneumonia or bronchitis.  Anxiety or stress.  Inflammation around your heart (pericarditis) or lung (pleuritis or pleurisy).  A blood clot in the lung.  A collapsed lung (pneumothorax). It can develop suddenly on its own (spontaneous pneumothorax) or from injury (trauma) to the chest.  Shingles infection (herpes zoster virus). The chest wall is composed of bones, muscles, and cartilage. Any of these can be the source of the pain.  The bones can be bruised by injury.  The muscles or cartilage can be strained by coughing or overwork.  The cartilage can be affected by inflammation and become sore (costochondritis). DIAGNOSIS  Lab tests or other studies, such as X-rays, electrocardiography, stress testing, or cardiac imaging, may be needed to find the cause of your pain.  TREATMENT   Treatment depends on what may be causing your chest pain. Treatment may include:  Acid blockers for heartburn.  Anti-inflammatory medicine.  Pain medicine for inflammatory conditions.  Antibiotics if an infection is present.  You may be advised to change lifestyle habits. This includes stopping smoking and avoiding alcohol, caffeine, and chocolate.  You may be advised to keep your head raised (elevated) when sleeping. This reduces the chance of acid going backward from your stomach into your esophagus.  Most of the time, nonspecific chest pain will improve within 2 to 3 days with rest and mild pain medicine. HOME CARE  INSTRUCTIONS   If antibiotics were prescribed, take your antibiotics as directed. Finish them even if you start to feel better.  For the next few days, avoid physical activities that bring on chest pain. Continue physical activities as directed.  Do not smoke.  Avoid drinking alcohol.  Only take over-the-counter or prescription medicine for pain, discomfort, or fever as directed by your caregiver.  Follow your caregiver's suggestions for further testing if your chest pain does not go away.  Keep any follow-up appointments you made. If you do not go to an appointment, you could develop lasting (chronic) problems with pain. If there is any problem keeping an appointment, you must call to reschedule. SEEK MEDICAL CARE IF:   You think you are having problems from the medicine you are taking. Read your medicine instructions carefully.  Your chest pain does not go away, even after treatment.  You develop a rash with blisters on your chest. SEEK IMMEDIATE MEDICAL CARE IF:   You have increased chest pain or pain that spreads to your arm, neck, jaw, back, or abdomen.  You develop shortness of breath, an increasing cough, or you are coughing up blood.  You have severe back or abdominal pain, feel nauseous, or vomit.  You develop severe weakness, fainting, or chills.  You have a fever. THIS IS AN EMERGENCY. Do not wait to see if the pain will go away. Get medical help at once. Call your local emergency services (911 in [place] Do not drive yourself to the hospital. MAKE SURE YOU:   Understand these instructions.  Will watch your condition.  Will get help right away if you are not doing well or get worse. Document Released: 03/19/2005 Document Revised: 09/01/2011 Document Reviewed: 01/13/2008 [facility] Patient Information 2014 [place].

## 2013-09-03 NOTE — ED Notes (Addendum)
Pt discharged home with all belongings, alert and ambulatory upon discharge, 1 new rx given, pt verbalizes understanding of discharge instructions, no narcotics given, pt drove self home

## 2013-12-23 ENCOUNTER — Emergency Department (HOSPITAL_COMMUNITY)
Admission: EM | Admit: 2013-12-23 | Discharge: 2013-12-24 | Disposition: A | Discharge status: Home / Self Care | Payer: No Typology Code available for payment source | Attending: Emergency Medicine | Admitting: Emergency Medicine

## 2013-12-23 ENCOUNTER — Encounter (HOSPITAL_COMMUNITY): Payer: Self-pay | Admitting: Emergency Medicine

## 2013-12-23 DIAGNOSIS — I739 Peripheral vascular disease, unspecified: Secondary | ICD-10-CM

## 2013-12-23 DIAGNOSIS — G8929 Other chronic pain: Secondary | ICD-10-CM | POA: Insufficient documentation

## 2013-12-23 DIAGNOSIS — Z8739 Personal history of other diseases of the musculoskeletal system and connective tissue: Secondary | ICD-10-CM | POA: Insufficient documentation

## 2013-12-23 DIAGNOSIS — Z8719 Personal history of other diseases of the digestive system: Secondary | ICD-10-CM | POA: Insufficient documentation

## 2013-12-23 DIAGNOSIS — E669 Obesity, unspecified: Secondary | ICD-10-CM | POA: Insufficient documentation

## 2013-12-23 DIAGNOSIS — Z7982 Long term (current) use of aspirin: Secondary | ICD-10-CM | POA: Insufficient documentation

## 2013-12-23 DIAGNOSIS — Z79899 Other long term (current) drug therapy: Secondary | ICD-10-CM | POA: Insufficient documentation

## 2013-12-23 DIAGNOSIS — Z8669 Personal history of other diseases of the nervous system and sense organs: Secondary | ICD-10-CM | POA: Insufficient documentation

## 2013-12-23 LAB — CBG MONITORING, ED: Glucose-Capillary: 104 mg/dL — ABNORMAL HIGH (ref 70–99)

## 2013-12-23 NOTE — ED Notes (Signed)
CBG 104 

## 2013-12-23 NOTE — ED Provider Notes (Signed)
CSN: [id number]     Arrival date & time 12/23/13  2122 History   First MD Initiated Contact with Patient 12/23/13 2317     Chief Complaint  Patient presents with  . Foot Pain     (Consider location/radiation/quality/duration/timing/severity/associated sxs/prior Treatment) Patient is a 58 y.o. female presenting with lower extremity pain. The history is provided by the patient.  Foot Pain   patient here complaining of one week of bilateral foot pain. Pain characterized as pins and needles and worse with standing and better with rest. Denies any rashes to her feet. No recent trauma. Denies any discoloration to her feet. No pedal edema or ankle edema. Symptoms have been persistent. No prior history of same. Denies history of diabetes. No history of peripheral vascular disease although she does have a history of hypertension. No treatment used prior to arrival. Does not have a primary care doctor at this point  Past Medical History  Diagnosis Date  . Chest pain     Negative nuclear stress 2007, negative stress echo 2011, reported negative cath 2000; negative stress echo November 2013  . GERD (gastroesophageal reflux disease)   . Sleep apnea   . DJD (degenerative joint disease)   . Obesity   . Chronic shoulder pain    Past Surgical History  Procedure Laterality Date  . Partial hysterectomy     Family History  Problem Relation Age of Onset  . Cancer Father     colon and later "throat;" in remission   History  Substance Use Topics  . Smoking status: Never Smoker   . Smokeless tobacco: Not on file  . Alcohol Use: 1.2 oz/week    2 Glasses of wine per week     Comment: occassional   OB History   Grav Para Term Preterm Abortions TAB SAB Ect Mult Living                 Review of Systems  All other systems reviewed and are negative.     Allergies  Acetaminophen  Home Medications   Prior to Admission medications   Medication Sig Start Date End Date Taking? Authorizing  Provider  aspirin EC 81 MG EC tablet Take 1 tablet (81 mg total) by mouth daily. 05/03/12  Yes Manuela SchwartzKaren-Akosua M Schooler, MD  metoprolol tartrate (LOPRESSOR) 25 MG tablet Take 12.5 mg by mouth 2 (two) times daily.   Yes Historical Provider, MD   BP 113/70  Pulse 79  Temp(Src) 98 F (36.7 C) (Oral)  Resp 18  Ht 5\' 4"  (1.626 m)  Wt 260 lb (117.935 kg)  BMI 44.61 kg/m2  SpO2 98% Physical Exam  Nursing note and vitals reviewed. Constitutional: She is oriented to person, place, and time. She appears well-developed and well-nourished.  Non-toxic appearance. No distress.  HENT:  Head: Normocephalic and atraumatic.  Eyes: Conjunctivae, EOM and lids are normal. Pupils are equal, round, and reactive to light.  Neck: Normal range of motion. Neck supple. No tracheal deviation present. No mass present.  Cardiovascular: Normal rate, regular rhythm and normal heart sounds.  Exam reveals no gallop.   No murmur heard. Pulmonary/Chest: Effort normal and breath sounds normal. No stridor. No respiratory distress. She has no decreased breath sounds. She has no wheezes. She has no rhonchi. She has no rales.  Abdominal: Soft. Normal appearance and bowel sounds are normal. She exhibits no distension. There is no tenderness. There is no rebound and no CVA tenderness.  Musculoskeletal: Normal range of motion. She exhibits  no edema and no tenderness.  No evidence of skin rashes or skin color changes. No pedal edema. Dorsalis pedis pulses 2+. Bilaterally. Cap refill less than 2 seconds bilaterally. Neurovascular intact  Neurological: She is alert and oriented to person, place, and time. She has normal strength. No cranial nerve deficit or sensory deficit. GCS eye subscore is 4. GCS verbal subscore is 5. GCS motor subscore is 6.  Skin: Skin is warm and dry. No abrasion and no rash noted.  Psychiatric: She has a normal mood and affect. Her speech is normal and behavior is normal.    ED Course  Procedures (including  critical care time) Labs Review Labs Reviewed  CBG MONITORING, ED    Imaging Review No results found.   EKG Interpretation None      MDM   Final diagnoses:  None   No evidence of diabetes at this time--suspect claudication--stable for d/c     Toy BakerAnthony T Veria Stradley, MD 12/24/13 0005

## 2013-12-23 NOTE — ED Notes (Signed)
Dr. Allen at the bedside.  

## 2013-12-23 NOTE — ED Notes (Signed)
The pt is c/o bi-lateral foot pain for one week.  No known injury.  No previous history.  Sl swelling of the rt foot.

## 2013-12-23 NOTE — ED Notes (Signed)
Patient reports swelling on lateral sides of both ankles. Tender on palpation on both ankles.

## 2013-12-24 MED ORDER — IBUPROFEN 600 MG PO TABS: 600.0000 mg | ORAL_TABLET | Freq: Four times a day (QID) | ORAL | Status: DC | PRN

## 2013-12-24 MED ORDER — OXYCODONE HCL 5 MG PO TABS: 5.0000 mg | ORAL_TABLET | ORAL | Status: DC | PRN

## 2013-12-24 NOTE — Discharge Instructions (Signed)
Intermittent Claudication Blockage of leg arteries results from poor circulation of blood in the leg arteries. This produces an aching, tired, and sometimes burning pain in the legs that is brought on by exercise and made better by rest. Claudication refers to the limping that happens from leg cramps. It is also referred to as Vaso-occlusive disease of the legs, arterial insufficiency of the legs, recurrent leg pain, recurrent leg cramping and calf pain with exercise.  CAUSES  This condition is due to narrowing or blockage of the arteries (muscular vessels which carry blood away from the heart and around the body). Blockage of arteries can occur anywhere in the body. If they occur in the heart, a person may experience angina (chest pain) or even a heart attack. If they occur in the neck or the brain, a person may have a stroke. Intermittent claudication is when the blockage occurs in the legs, most commonly in the calf or the foot.  Atherosclerosis, or blockage of arteries, can occur for many reasons. Some of these are smoking, diabetes, and high cholesterol. SYMPTOMS  Intermittent claudication may occur in both legs, and it often continues to get worse over time. However, some people complain only of weakness in the legs when walking, or a feeling of "tiredness" in the buttocks. Impotence (not able to have an erection) is an occasional complaint in men. Pain while resting is uncommon.  WHAT TO EXPECT AT [facility] PROVIDER'S OFFICE: Your medical history will be asked for and a physical examination will be performed. Medical history questions documenting claudication in detail may include:   Time pattern  Do you have leg cramps at night (nocturnal cramps)?  How often does leg pain with cramping occur?  Is it getting worse?  What is the quality of the pain?  Is the pain sharp?  Is there an aching pain with the cramps?  Aggravating factors  Is it worse after you exercise?  Is it  worse after you are standing for a while?  Do you smoke? How much?  Do you drink alcohol? How much?  Are you diabetic? How well is your blood sugar controlled?  Other  What other symptoms are also present?  Has there been impotence (men)?  Is there pain in the back?  Is there a darkening of the skin of the legs, feet or toes?  Is there weakness or paralysis of the legs? The physical examination may include evaluation of the femoral pulse (in the groin) and the other areas where the pulse can be felt in the legs. DIAGNOSIS  Diagnostic tests that may be performed include:  Blood pressure measured in arms and legs for comparison.  Doppler ultrasonography on the legs and the heart.  Duplex Doppler/ultrasound exam of extremity to visualize arterial blood flow.  ECG- to evaluate the activity of your heart.  Aortography- to visualize blockages in your arteries. TREATMENT Surgical treatment may be suggested if claudication interferes with the patient's activities or work, and if the diseased arteries do not seem to be improving after treatment. Be aware that this condition can worsen over time and you should carefully monitor your condition. HOME CARE INSTRUCTIONS  Talk to your caregiver about the cause of your leg cramping and about what to do at home to relieve it.  A healthy diet is important to lessen the likeliness of atherosclerosis.  A program of daily walking for short periods, and stopping for pain or cramping, may help improve function.  It is important to  stop smoking.  Avoid putting hot or cold items on legs.  Avoid tight shoes. SEEK MEDICAL CARE IF: There are many other causes of leg pain such as arthritis or low blood potassium. However, some causes of leg pain may be life threatening such as a blood clot in the legs. Seek medical attention if you have:  Leg pain that does not go away.  Legs that may be red, hot or swollen.  Ulcers or sores appear on your  ankle or foot.  Any chest pain or shortness of breath accompanying leg pain.  Diabetes.  You are pregnant. SEEK IMMEDIATE MEDICAL CARE IF:   Your leg pain becomes severe or will not go away.  Your foot turns blue or a dark color.  Your leg becomes red, hot or swollen or you develop a fever over 102F.  Any chest pain or shortness of breath accompanying leg pain. MAKE SURE YOU:   Understand these instructions.  Will watch your condition.  Will get help right away if you are not doing well or get worse. Document Released: 04/11/2004 Document Revised: 09/01/2011 Document Reviewed: 01/28/2008 [facility] Patient Information 2015 [place], [place]. This information is not intended to replace advice given to you by your health care provider. Make sure you discuss any questions you have with your health care provider.

## 2014-04-01 ENCOUNTER — Emergency Department (HOSPITAL_COMMUNITY): Payer: No Typology Code available for payment source

## 2014-04-01 ENCOUNTER — Emergency Department (HOSPITAL_COMMUNITY)
Admission: EM | Admit: 2014-04-01 | Discharge: 2014-04-01 | Disposition: A | Discharge status: Home / Self Care | Payer: Self-pay | Attending: Emergency Medicine | Admitting: Emergency Medicine

## 2014-04-01 ENCOUNTER — Encounter (HOSPITAL_COMMUNITY): Payer: Self-pay | Admitting: Emergency Medicine

## 2014-04-01 DIAGNOSIS — Z8719 Personal history of other diseases of the digestive system: Secondary | ICD-10-CM | POA: Insufficient documentation

## 2014-04-01 DIAGNOSIS — Z7982 Long term (current) use of aspirin: Secondary | ICD-10-CM | POA: Insufficient documentation

## 2014-04-01 DIAGNOSIS — G8929 Other chronic pain: Secondary | ICD-10-CM | POA: Insufficient documentation

## 2014-04-01 DIAGNOSIS — E669 Obesity, unspecified: Secondary | ICD-10-CM | POA: Insufficient documentation

## 2014-04-01 DIAGNOSIS — J40 Bronchitis, not specified as acute or chronic: Secondary | ICD-10-CM | POA: Insufficient documentation

## 2014-04-01 DIAGNOSIS — M7731 Calcaneal spur, right foot: Secondary | ICD-10-CM | POA: Insufficient documentation

## 2014-04-01 DIAGNOSIS — I10 Essential (primary) hypertension: Secondary | ICD-10-CM | POA: Insufficient documentation

## 2014-04-01 MED ORDER — ALBUTEROL SULFATE HFA 108 (90 BASE) MCG/ACT IN AERS: 2.0000 | INHALATION_SPRAY | Freq: Four times a day (QID) | RESPIRATORY_TRACT | Status: DC | PRN

## 2014-04-01 MED ORDER — GUAIFENESIN-CODEINE 100-10 MG/5ML PO SOLN
10.0000 mL | Freq: Once | ORAL | Status: AC
  Administered 2014-04-01: 10 mL via ORAL
  Filled 2014-04-01: qty 10

## 2014-04-01 MED ORDER — METOPROLOL TARTRATE 25 MG PO TABS: 12.5000 mg | ORAL_TABLET | Freq: Two times a day (BID) | ORAL | Status: DC

## 2014-04-01 MED ORDER — GUAIFENESIN-CODEINE 100-10 MG/5ML PO SOLN: 10.0000 mL | Freq: Four times a day (QID) | ORAL | Status: DC | PRN

## 2014-04-01 MED ORDER — HYDROCODONE-IBUPROFEN 7.5-200 MG PO TABS: 1.0000 | ORAL_TABLET | Freq: Four times a day (QID) | ORAL | Status: DC | PRN

## 2014-04-01 NOTE — Discharge Instructions (Signed)
Please follow up with your primary care physician in 1-2 days. If you do not have one [facility]. Please take pain medication and/or muscle relaxants as prescribed and as needed for pain. Please do not drive on narcotic pain medication or on muscle relaxants. Please take all medications as prescribed. As you're inhaler 2 puffs every 4-6 hours for cough, shortness of breath, chest tightness for the next 3 days. Please followup with podiatry office regarding her foot. Please recall discharge instructions are options.  Acute Bronchitis Bronchitis is inflammation of the airways that extend from the windpipe into the lungs (bronchi). The inflammation often causes mucus to develop. This leads to a cough, which is the most common symptom of bronchitis.  In acute bronchitis, the condition usually develops suddenly and goes away over time, usually in a couple weeks. Smoking, allergies, and asthma can make bronchitis worse. Repeated episodes of bronchitis may cause further lung problems.  CAUSES Acute bronchitis is most often caused by the same virus that causes a cold. The virus can spread from person to person (contagious) through coughing, sneezing, and touching contaminated objects. SIGNS AND SYMPTOMS   Cough.   Fever.   Coughing up mucus.   Body aches.   Chest congestion.   Chills.   Shortness of breath.   Sore throat.  DIAGNOSIS  Acute bronchitis is usually diagnosed through a physical exam. Your health care provider will also ask you questions about your medical history. Tests, such as chest X-rays, are sometimes done to rule out other conditions.  TREATMENT  Acute bronchitis usually goes away in a couple weeks. Oftentimes, no medical treatment is necessary. Medicines are sometimes given for relief of fever or cough. Antibiotic medicines are usually not needed but may be prescribed in certain situations. In some cases, an  inhaler may be recommended to help reduce shortness of breath and control the cough. A cool mist vaporizer may also be used to help thin bronchial secretions and make it easier to clear the chest.  HOME CARE INSTRUCTIONS  Get plenty of rest.   Drink enough fluids to keep your urine clear or pale yellow (unless you have a medical condition that requires fluid restriction). Increasing fluids may help thin your respiratory secretions (sputum) and reduce chest congestion, and it will prevent dehydration.   Take medicines only as directed by your health care provider.  If you were prescribed an antibiotic medicine, finish it all even if you start to feel better.  Avoid smoking and secondhand smoke. Exposure to cigarette smoke or irritating chemicals will make bronchitis worse. If you are a smoker, consider using nicotine gum or skin patches to help control withdrawal symptoms. Quitting smoking will help your lungs heal faster.   Reduce the chances of another bout of acute bronchitis by washing your hands frequently, avoiding people with cold symptoms, and trying not to touch your hands to your mouth, nose, or eyes.   Keep all follow-up visits as directed by your health care provider.  SEEK MEDICAL CARE IF: Your symptoms do not improve after 1 week of treatment.  SEEK IMMEDIATE MEDICAL CARE IF:  You develop an increased fever or chills.   You have chest pain.   You have severe shortness of breath.  You have bloody sputum.   You develop dehydration.  You faint or repeatedly feel like you are going to pass out.  You develop repeated vomiting.  You develop a severe headache. MAKE  SURE YOU:   Understand these instructions.  Will watch your condition.  Will get help right away if you are not doing well or get worse. Document Released: 07/17/2004 Document Revised: 10/24/2013 Document Reviewed: 11/30/2012 [facility] Patient Information 2015 [place], [place]. This information is not  intended to replace advice given to you by your health care provider. Make sure you discuss any questions you have with your health care provider.  Heel Spur A heel spur is a hook of bone that can form on the calcaneus (the heel bone and the largest bone of the foot). Heel spurs are often associated with plantar fasciitis and usually come in people who have had the problem for an extended period of time. The cause of the relationship is unknown. The pain associated with them is thought to be caused by an inflammation (soreness and redness) of the plantar fascia rather than the spur itself. The plantar fascia is a thick fibrous like tissue that runs from the calcaneus (heel bone) to the ball of the foot. This strong, tight tissue helps maintain the arch of your foot. It helps distribute the weight across your foot as you walk or run. Stresses placed on the plantar fascia can be tremendous. When it is inflamed normal activities become painful. Pain is worse in the morning after sleeping. After sleeping the plantar fascia is tight. The first movements stretch the fascia and this causes pain. As the tendon loosens, the pain usually gets better. It often returns with too much standing or walking.  About 70% of patients with plantar fasciitis have a heel spur. About half of people without foot pain also have heel spurs. DIAGNOSIS  The diagnosis of a heel spur is made by X-ray. The X-ray shows a hook of bone protruding from the bottom of the calcaneus at the point where the plantar fascia is attached to the heel bone.  TREATMENT  It is necessary to find out what is causing the stretching of the plantar fascia. If the cause is over-pronation (flat feet), orthotics and proper foot ware may help.  Stretching exercises, losing weight, wearing shoes that have a cushioned heel that absorbs shock, and elevating the heel with the use of a heel cradle, heel cup, or orthotics may all help. Heel cradles and heel cups  provide extra comfort and cushion to the heel, and reduce the amount of shock to the sore area. AVOIDING THE PAIN OF PLANTAR FASCIITIS AND HEEL SPURS  Consult a sports medicine professional before beginning a new exercise program.  Walking programs offer a good workout. There is a lower chance of overuse injuries common to the runners. There is less impact and less jarring of the joints.  Begin all new exercise programs slowly. If problems or pains develop, decrease the amount of time or distance until you are at a comfortable level.  Wear good shoes and replace them regularly.  Stretch your foot and the heel cords at the back of the ankle (Achilles tendons) both before and after exercise.  Run or exercise on even surfaces that are not hard. For example, asphalt is better than pavement.  Do not run barefoot on hard surfaces.  If using a treadmill, vary the incline.  Do not continue to workout if you have foot or joint problems. Seek professional help if they do not improve. HOME CARE INSTRUCTIONS   Avoid activities that cause you pain until you recover.  Use ice or cold packs to the problem or painful areas after working  out.  Only take over-the-counter or prescription medicines for pain, discomfort, or fever as directed by your caregiver.  Soft shoe inserts or athletic shoes with air or gel sole cushions may be helpful.  If problems continue or become more severe, consult a sports medicine caregiver. Cortisone is a potent anti-inflammatory medication that may be injected into the painful area. You can discuss this treatment with your caregiver. MAKE SURE YOU:   Understand these instructions.  Will watch your condition.  Will get help right away if you are not doing well or get worse. Document Released: 07/16/2005 Document Revised: 09/01/2011 Document Reviewed: 08/10/2013 [facility] Patient Information 2015 [place], [place]. This information is not intended to replace advice given  to you by your health care provider. Make sure you discuss any questions you have with your health care provider.

## 2014-04-01 NOTE — ED Provider Notes (Signed)
CSN: [id number]     Arrival date & time 04/01/14  1142 History   First MD Initiated Contact with Patient 04/01/14 1503     Chief Complaint  Patient presents with  . Cough  . Nasal Congestion  . Foot Pain     (Consider location/radiation/quality/duration/timing/severity/associated sxs/prior Treatment) HPI Comments: Patient is a 58 year old female past medical history significant for hypertension, GERD, degenerative joint disease presented to the emergency department with 2 complaints. Patient's first complaint is 2 weeks of nasal congestion, rhinorrhea, nonproductive cough, posttussive chest tightness. Alleviating factors: Alka-Seltzer, Mucinex. Aggravating factors: None. Medications tried prior to arrival: Alka-Seltzer, Mucinex. No sick contacts. Patient's second complaint is 3 weeks of right foot pain. Describes the pain as aching in nature. No alleviating factors. Aggravating factors include ambulation and palpation. She has tried over-the-counter medications without any improvement. No history of injury or surgeries. Denies any numbness. No injuries or falls.    Patient is a 58 y.o. female presenting with cough and lower extremity pain.  Cough Associated symptoms: myalgias and rhinorrhea   Foot Pain Associated symptoms include arthralgias, congestion, coughing and myalgias. Pertinent negatives include no nausea or vomiting.    Past Medical History  Diagnosis Date  . Chest pain     Negative nuclear stress 2007, negative stress echo 2011, reported negative cath 2000; negative stress echo November 2013  . GERD (gastroesophageal reflux disease)   . Sleep apnea   . DJD (degenerative joint disease)   . Obesity   . Chronic shoulder pain    Past Surgical History  Procedure Laterality Date  . Partial hysterectomy     Family History  Problem Relation Age of Onset  . Cancer Father     colon and later "throat;" in remission   History  Substance Use Topics  . Smoking status: Never  Smoker   . Smokeless tobacco: Not on file  . Alcohol Use: 1.2 oz/week    2 Glasses of wine per week     Comment: occassional   OB History   Grav Para Term Preterm Abortions TAB SAB Ect Mult Living                 Review of Systems  HENT: Positive for congestion and rhinorrhea.   Respiratory: Positive for cough and chest tightness (posttussive).   Gastrointestinal: Negative for nausea and vomiting.  Musculoskeletal: Positive for arthralgias and myalgias.  All other systems reviewed and are negative.     Allergies  Acetaminophen  Home Medications   Prior to Admission medications   Medication Sig Start Date End Date Taking? Authorizing Provider  aspirin EC 81 MG EC tablet Take 1 tablet (81 mg total) by mouth daily. 05/03/12  Yes Hector ShadeKaren M Schooler, MD  albuterol (PROVENTIL HFA;VENTOLIN HFA) 108 (90 BASE) MCG/ACT inhaler Inhale 2 puffs into the lungs every 6 (six) hours as needed for wheezing or shortness of breath. 04/01/14   Kayte Borchard L Denorris Reust, PA-C  guaiFENesin-codeine 100-10 MG/5ML syrup Take 10 mLs by mouth every 6 (six) hours as needed for cough. 04/01/14   Marlana Mckowen L Alizandra Loh, PA-C  HYDROcodone-ibuprofen (VICOPROFEN) 7.5-200 MG per tablet Take 1 tablet by mouth every 6 (six) hours as needed for severe pain. 04/01/14   Yazeed Pryer L Aydian Dimmick, PA-C  metoprolol tartrate (LOPRESSOR) 25 MG tablet Take 0.5 tablets (12.5 mg total) by mouth 2 (two) times daily. 04/01/14   Orell Hurtado L Reyn Faivre, PA-C   BP 109/71  Pulse 66  Temp(Src) 97.5 F (36.4 C) (Oral)  Resp 19  Ht 5\' 5"  (1.651 m)  Wt 260 lb (117.935 kg)  BMI 43.27 kg/m2  SpO2 100% Physical Exam  Nursing note and vitals reviewed. Constitutional: She is oriented to person, place, and time. She appears well-developed and well-nourished. No distress.  HENT:  Head: Normocephalic and atraumatic.  Right Ear: External ear normal.  Left Ear: External ear normal.  Nose: Nose normal.  Mouth/Throat: Oropharynx is clear and  moist.  Eyes: Conjunctivae are normal.  Neck: Normal range of motion. Neck supple.  Cardiovascular: Normal rate, regular rhythm, normal heart sounds and intact distal pulses.   Pulmonary/Chest: Effort normal and breath sounds normal. No respiratory distress. She exhibits no tenderness.  Abdominal: Soft.  Musculoskeletal: Normal range of motion. She exhibits tenderness.       Right ankle: Normal.       Left ankle: Normal.       Right foot: She exhibits tenderness and bony tenderness. She exhibits normal range of motion, no swelling, normal capillary refill, no crepitus, no deformity and no laceration.       Left foot: Normal.       Feet:  No erythema or warmth in any of the right foot joints.  Neurological: She is alert and oriented to person, place, and time.  Sensation grossly intact  Skin: Skin is warm and dry. She is not diaphoretic.  Psychiatric: She has a normal mood and affect.    ED Course  Procedures (including critical care time) Medications  guaiFENesin-codeine 100-10 MG/5ML solution 10 mL (10 mLs Oral Given 04/01/14 1532)    Labs Review Labs Reviewed - No data to display  Imaging Review Dg Chest 2 View  04/01/2014   CLINICAL DATA:  Bronchitis, cough, congestion for 3 weeks  EXAM: CHEST  2 VIEW  COMPARISON:  09/03/2013  FINDINGS: Cardiomediastinal silhouette is stable. No acute infiltrate or pulmonary edema. Central mild bronchitic changes. Degenerative changes thoracic spine.  IMPRESSION: No acute infiltrate or pulmonary edema. Central mild bronchitic changes.   Electronically Signed   By: Natasha MeadLiviu  Pop M.D.   On: 04/01/2014 17:43   Dg Foot Complete Right  04/01/2014   CLINICAL DATA:  Pain superior aspect of the foot for 4 weeks, no known injury  EXAM: RIGHT FOOT COMPLETE - 3+ VIEW  COMPARISON:  05/06/2013  FINDINGS: Three views of the right foot submitted. No acute fracture or subluxation. Osteoarthritic changes are noted first metatarsal phalangeal joint. Small plantar  spur of calcaneus.  IMPRESSION: No acute fracture or subluxation. Osteoarthritic changes first metatarsal phalangeal joint. Small plantar spur of calcaneus.   Electronically Signed   By: Natasha MeadLiviu  Pop M.D.   On: 04/01/2014 17:44     EKG Interpretation None      MDM   Final diagnoses:  Bronchitis  Calcaneal spur of right foot    Filed Vitals:   04/01/14 1800  BP: 109/71  Pulse:   Temp:   Resp:    Afebrile, NAD, non-toxic appearing, AAOx4.   1) URI: Pt CXR negative for acute infiltrate. She was likely bronchitis. Patients symptoms are consistent with URI, likely viral etiology. Discussed that antibiotics are not indicated for viral infections. Pt will be discharged with symptomatic treatment.  Verbalizes understanding and is agreeable with plan. Pt is hemodynamically stable & in NAD prior to dc.  2) Foot pain: Imaging shows no fracture. Directed pt to ice injury, take acetaminophen or ibuprofen for pain, and to elevate and rest the injury when possible. Advised podiatry follow up.   Preterm  precautions discussed. Patient is agreeable to plan. Patient is stable at time of discharge.  Lise Auer Aniayah Alaniz, PA-C 04/02/14 0010

## 2014-04-01 NOTE — ED Notes (Signed)
Pt reports a cough with congestion for 2 days, taken OTC medication without much relief. Reports that her right foot has also been hurting her and feels like "pins and needles". Denies any hx of gout or sores to the foot. Report she has tried epsom salt at home with relief.

## 2014-04-02 NOTE — ED Provider Notes (Signed)
Medical screening examination/treatment/procedure(s) were performed by non-physician practitioner and as supervising physician I was immediately available for consultation/collaboration.    Joesiah Lonon D Hermilo Dutter, MD 04/02/14 1028 

## 2014-08-09 ENCOUNTER — Emergency Department (HOSPITAL_COMMUNITY)
Admission: EM | Admit: 2014-08-09 | Discharge: 2014-08-09 | Disposition: A | Discharge status: Home / Self Care | Payer: No Typology Code available for payment source | Attending: Emergency Medicine | Admitting: Emergency Medicine

## 2014-08-09 ENCOUNTER — Encounter (HOSPITAL_COMMUNITY): Payer: Self-pay | Admitting: Family Medicine

## 2014-08-09 DIAGNOSIS — Z7982 Long term (current) use of aspirin: Secondary | ICD-10-CM | POA: Insufficient documentation

## 2014-08-09 DIAGNOSIS — Z8719 Personal history of other diseases of the digestive system: Secondary | ICD-10-CM | POA: Insufficient documentation

## 2014-08-09 DIAGNOSIS — Z79899 Other long term (current) drug therapy: Secondary | ICD-10-CM | POA: Insufficient documentation

## 2014-08-09 DIAGNOSIS — G44219 Episodic tension-type headache, not intractable: Secondary | ICD-10-CM | POA: Insufficient documentation

## 2014-08-09 DIAGNOSIS — M1990 Unspecified osteoarthritis, unspecified site: Secondary | ICD-10-CM | POA: Insufficient documentation

## 2014-08-09 DIAGNOSIS — I10 Essential (primary) hypertension: Secondary | ICD-10-CM | POA: Insufficient documentation

## 2014-08-09 DIAGNOSIS — L02811 Cutaneous abscess of head [any part, except face]: Secondary | ICD-10-CM | POA: Insufficient documentation

## 2014-08-09 DIAGNOSIS — G8929 Other chronic pain: Secondary | ICD-10-CM | POA: Insufficient documentation

## 2014-08-09 DIAGNOSIS — R42 Dizziness and giddiness: Secondary | ICD-10-CM | POA: Insufficient documentation

## 2014-08-09 DIAGNOSIS — E669 Obesity, unspecified: Secondary | ICD-10-CM | POA: Insufficient documentation

## 2014-08-09 HISTORY — DX: Essential (primary) hypertension: I10

## 2014-08-09 LAB — BASIC METABOLIC PANEL
Anion gap: 7 (ref 5–15)
BUN: 7 mg/dL (ref 6–23)
CO2: 26 mmol/L (ref 19–32)
Calcium: 9.5 mg/dL (ref 8.4–10.5)
Chloride: 104 mmol/L (ref 96–112)
Creatinine, Ser: 0.57 mg/dL (ref 0.50–1.10)
GFR calc Af Amer: >90 mL/min (ref >90)
GFR calc non Af Amer: >90 mL/min (ref >90)
Glucose, Bld: 112 mg/dL — ABNORMAL HIGH (ref 70–99)
Potassium: 3.8 mmol/L (ref 3.5–5.1)
SODIUM: 137 mmol/L (ref 135–145)

## 2014-08-09 LAB — CBC
HEMATOCRIT: 42.1 % (ref 36.0–46.0)
Hemoglobin: 13.8 g/dL (ref 12.0–15.0)
MCH: 26.7 pg (ref 26.0–34.0)
MCHC: 32.8 g/dL (ref 30.0–36.0)
MCV: 81.4 fL (ref 78.0–100.0)
PLATELETS: 308 10*3/uL (ref 150–400)
RBC: 5.17 MIL/uL — AB (ref 3.87–5.11)
RDW: 15.3 % (ref 11.5–15.5)
WBC: 11 10*3/uL — AB (ref 4.0–10.5)

## 2014-08-09 LAB — I-STAT TROPONIN, ED: Troponin i, poc: 0 ng/mL (ref 0.00–0.08)

## 2014-08-09 MED ORDER — METOPROLOL TARTRATE 25 MG PO TABS: 12.5000 mg | ORAL_TABLET | Freq: Two times a day (BID) | ORAL | Status: DC

## 2014-08-09 MED ORDER — KETOROLAC TROMETHAMINE 60 MG/2ML IM SOLN
60.0000 mg | Freq: Once | INTRAMUSCULAR | Status: AC
  Administered 2014-08-09: 60 mg via INTRAMUSCULAR
  Filled 2014-08-09: qty 2

## 2014-08-09 NOTE — Discharge Instructions (Signed)
Please read and follow all provided instructions.  Your diagnoses today include:  1. Episodic tension-type headache, not intractable   2. Lightheadedness     Tests performed today include:  Vital signs. See below for your results today.   Blood counts and electrolytes  Blood test for heart muscle damage - no sign of heart attack  EKG - not changed from old EKGs  Medications prescribed:   None  Take any prescribed medications only as directed.  Home care instructions:  Follow any educational materials contained in this packet.  BE VERY CAREFUL not to take multiple medicines containing Tylenol (also called acetaminophen). Doing so can lead to an overdose which can damage your liver and cause liver failure and possibly death.   Follow-up instructions: Please follow-up with your primary care provider in the next 3 days for further evaluation of your symptoms.   Return instructions:  SEEK IMMEDIATE MEDICAL ATTENTION IF:  There is confusion or drowsiness (although children frequently become drowsy after injury).   You cannot awaken the injured person.   You have more than one episode of vomiting.   You notice dizziness or unsteadiness which is getting worse, or inability to walk.   You have convulsions or unconsciousness.   You experience severe, persistent headaches not relieved by Tylenol.  You cannot use arms or legs normally.   There are changes in pupil sizes. (This is the black center in the colored part of the eye)   There is clear or bloody discharge from the nose or ears.   You have change in speech, vision, swallowing, or understanding.   Localized weakness, numbness, tingling, or change in bowel or bladder control.  You have any other emergent concerns.  Additional Information: You have had a head injury which does not appear to require admission at this time.  Your vital signs today were: BP 132/95 mmHg   Pulse 79   Temp(Src) 98.2 F (36.8 C)  (Oral)   Resp 19   SpO2 99% If your blood pressure (BP) was elevated above 135/85 this visit, please have this repeated by your doctor within one month. --------------

## 2014-08-09 NOTE — ED Provider Notes (Signed)
CSN: [id number]     Arrival date & time 08/09/14  1057 History   First MD Initiated Contact with Patient 08/09/14 1252     Chief Complaint  Patient presents with  . Dizziness  . Headache     (Consider location/radiation/quality/duration/timing/severity/associated sxs/prior Treatment) HPI Comments: Patient presents with complaint of headache and lightheadedness for the past 3 days. Patient describes the headache as being frontal, throbbing in nature, without photophobia. Noise makes the pain worse. Patient states that she feels stressed because of increased activity with her foster children at home. She thinks this is what is causing the HA. She denies any fever or neck pain. No vision changes. Patient denies signs of stroke including: facial droop, slurred speech, aphasia, weakness/numbness in extremities, imbalance/trouble walking. She has tried ibuprofen and naproxen without much relief.   Patient also complains of lightheadedness with standing which and resolves. She also notices when she bends over and then sits back up. No full syncope. No chest pain or shortness of breath. Patient states that she's been eating and drinking well. This has been ongoing for approximately 3 days as well. Patient has been on metoprolol in the past but stopped taking one month ago when she ran out.    Patient is a 59 y.o. female presenting with dizziness and headaches. The history is provided by the patient and medical records.  Dizziness Associated symptoms: headaches   Associated symptoms: no chest pain, no nausea, no shortness of breath, no vomiting and no weakness   Headache Associated symptoms: no congestion, no dizziness, no fever, no nausea, no neck pain, no neck stiffness, no numbness, no photophobia, no sinus pressure, no vomiting and no weakness     Past Medical History  Diagnosis Date  . Chest pain     Negative nuclear stress 2007, negative stress echo 2011, reported negative cath 2000; negative  stress echo November 2013  . GERD (gastroesophageal reflux disease)   . Sleep apnea   . DJD (degenerative joint disease)   . Obesity   . Chronic shoulder pain   . Hypertension    Past Surgical History  Procedure Laterality Date  . Partial hysterectomy     Family History  Problem Relation Age of Onset  . Cancer Father     colon and later "throat;" in remission   History  Substance Use Topics  . Smoking status: Never Smoker   . Smokeless tobacco: Not on file  . Alcohol Use: 1.2 oz/week    2 Glasses of wine per week     Comment: occassional   OB History    No data available     Review of Systems  Constitutional: Negative for fever.  HENT: Negative for congestion, dental problem, rhinorrhea and sinus pressure.   Eyes: Negative for photophobia, discharge, redness and visual disturbance.  Respiratory: Negative for shortness of breath.   Cardiovascular: Negative for chest pain.  Gastrointestinal: Negative for nausea and vomiting.  Musculoskeletal: Negative for gait problem, neck pain and neck stiffness.  Skin: Negative for rash.  Neurological: Positive for light-headedness and headaches. Negative for dizziness, syncope, speech difficulty, weakness and numbness.  Psychiatric/Behavioral: Negative for confusion.      Allergies  Acetaminophen  Home Medications   Prior to Admission medications   Medication Sig Start Date End Date Taking? Authorizing Provider  albuterol (PROVENTIL HFA;VENTOLIN HFA) 108 (90 BASE) MCG/ACT inhaler Inhale 2 puffs into the lungs every 6 (six) hours as needed for wheezing or shortness of breath. 04/01/14  Jennifer L Piepenbrink, PA-C  aspirin EC 81 MG EC tablet Take 1 tablet (81 mg total) by mouth daily. 05/03/12   Kristie Cowman, MD  guaiFENesin-codeine 100-10 MG/5ML syrup Take 10 mLs by mouth every 6 (six) hours as needed for cough. 04/01/14   Jennifer L Piepenbrink, PA-C  HYDROcodone-ibuprofen (VICOPROFEN) 7.5-200 MG per tablet Take 1 tablet by  mouth every 6 (six) hours as needed for severe pain. 04/01/14   Jennifer L Piepenbrink, PA-C  metoprolol tartrate (LOPRESSOR) 25 MG tablet Take 0.5 tablets (12.5 mg total) by mouth 2 (two) times daily. 04/01/14   Jennifer L Piepenbrink, PA-C   BP 125/80 mmHg  Pulse 86  Temp(Src) 98.2 F (36.8 C) (Oral)  Resp 20  SpO2 94% Physical Exam  Constitutional: She is oriented to person, place, and time. She appears well-developed and well-nourished.  HENT:  Head: Normocephalic and atraumatic.  Right Ear: Tympanic membrane, external ear and ear canal normal.  Left Ear: Tympanic membrane, external ear and ear canal normal.  Nose: Nose normal.  Mouth/Throat: Uvula is midline, oropharynx is clear and moist and mucous membranes are normal.  Small abscess noted R posterior scalp, not amenable to drainage at this time due to size.   Eyes: Conjunctivae, EOM and lids are normal. Pupils are equal, round, and reactive to light. Right eye exhibits no discharge. Left eye exhibits no discharge. Right eye exhibits no nystagmus. Left eye exhibits no nystagmus.  Neck: Normal range of motion. Neck supple.  Cardiovascular: Normal rate, regular rhythm and normal heart sounds.   No murmur heard. Pulmonary/Chest: Effort normal and breath sounds normal. No respiratory distress. She has no wheezes. She has no rales.  Abdominal: Soft. There is no tenderness.  Musculoskeletal: She exhibits no edema or tenderness.       Cervical back: She exhibits normal range of motion, no tenderness and no bony tenderness.  Neurological: She is alert and oriented to person, place, and time. She has normal strength and normal reflexes. No cranial nerve deficit or sensory deficit. She displays a negative Romberg sign. Coordination and gait normal. GCS eye subscore is 4. GCS verbal subscore is 5. GCS motor subscore is 6.  Skin: Skin is warm and dry.  Psychiatric: She has a normal mood and affect.  Nursing note and vitals reviewed.   ED  Course  Procedures (including critical care time) Labs Review Labs Reviewed  CBC - Abnormal; Notable for the following:    WBC 11.0 (*)    RBC 5.17 (*)    All other components within normal limits  BASIC METABOLIC PANEL - Abnormal; Notable for the following:    Glucose, Bld 112 (*)    All other components within normal limits  I-STAT TROPOININ, ED    Imaging Review No results found.   EKG Interpretation   Date/Time:  Wednesday August 09 2014 11:16:19 EST Ventricular Rate:  88 PR Interval:  144 QRS Duration: 84 QT Interval:  360 QTC Calculation: 435 R Axis:   53 Text Interpretation:  Normal sinus rhythm Nonspecific ST and T wave  abnormality Abnormal ECG Confirmed by Rubin Payor  MD, Harrold Donath [phone]) on  08/09/2014 2:50:11 PM       1:39 PM Patient seen and examined. Work-up reviewed with patient. No signs of anemia. Will get orthostatics. Patient does not want any medications that'll make her drowsy. Will give IM Toradol. Patient is very energetic and has absolutely no neurological deficits at this time. She appears very well and I do not suspect  a serious intercranial process on exam.  Will also give refill blood pressure medication as well as PCP referral.  EKG reviewed and shows non-specific t-wave changes infero-laterally that is unchanged from previous EKGs performed in 2015.   Vital signs reviewed and are as follows: BP 125/80 mmHg  Pulse 86  Temp(Src) 98.2 F (36.8 C) (Oral)  Resp 20  SpO2 94%  2:46 PM Patient is now dressed, sitting on bed, awaiting discharge.   Patient was counseled on symptoms that should indicate their return to the ED. These include severe worsening headache, vision changes, confusion, loss of consciousness, trouble walking, nausea & vomiting, or weakness/tingling in extremities.     MDM   Final diagnoses:  Episodic tension-type headache, not intractable  Lightheadedness   Patient with headache, suspect tension type. She appears in no  distress whatsoever. She is age > 7350 and does not get a lot of headaches, but she is well-appearing and with normal exam do not feel that she needs advanced imaging at this time.   Patient without high-risk features of headache including: sudden onset/thunderclap HA, altered mental status, accompanying seizure, headache with exertion, history of immunocompromise, neck or shoulder pain, fever, use of anticoagulation, family history of spontaneous SAH, concomitant drug use, toxic exposure.   Patient has a normal complete neurological exam, normal vital signs, normal level of consciousness, no signs of meningismus, is well-appearing/non-toxic appearing, no signs of trauma, no pain over the temporal arteries.   Imaging with CT/MRI not indicated given history and physical exam findings, well-appearing patient with mild symptoms.   No dangerous or life-threatening conditions suspected or identified by history, physical exam, and by work-up. No indications for hospitalization identified.      Renne CriglerJoshua Briseidy Spark, PA-C 08/09/14 1517  Juliet RudeNathan R. Rubin PayorPickering, MD 08/10/14 [phone]

## 2014-08-09 NOTE — ED Notes (Signed)
Pt having dizziness and HA over the past few days. sts she has been out of her BP meds.

## 2014-09-01 ENCOUNTER — Emergency Department (HOSPITAL_COMMUNITY)
Admission: EM | Admit: 2014-09-01 | Discharge: 2014-09-01 | Disposition: A | Discharge status: Home / Self Care | Payer: No Typology Code available for payment source | Attending: Emergency Medicine | Admitting: Emergency Medicine

## 2014-09-01 ENCOUNTER — Emergency Department (HOSPITAL_COMMUNITY): Payer: No Typology Code available for payment source

## 2014-09-01 ENCOUNTER — Encounter (HOSPITAL_COMMUNITY): Payer: Self-pay | Admitting: Emergency Medicine

## 2014-09-01 DIAGNOSIS — G8929 Other chronic pain: Secondary | ICD-10-CM | POA: Insufficient documentation

## 2014-09-01 DIAGNOSIS — M5441 Lumbago with sciatica, right side: Secondary | ICD-10-CM | POA: Insufficient documentation

## 2014-09-01 DIAGNOSIS — Z79899 Other long term (current) drug therapy: Secondary | ICD-10-CM | POA: Insufficient documentation

## 2014-09-01 DIAGNOSIS — X58XXXA Exposure to other specified factors, initial encounter: Secondary | ICD-10-CM | POA: Insufficient documentation

## 2014-09-01 DIAGNOSIS — Z8719 Personal history of other diseases of the digestive system: Secondary | ICD-10-CM | POA: Insufficient documentation

## 2014-09-01 DIAGNOSIS — Y999 Unspecified external cause status: Secondary | ICD-10-CM | POA: Insufficient documentation

## 2014-09-01 DIAGNOSIS — I10 Essential (primary) hypertension: Secondary | ICD-10-CM | POA: Insufficient documentation

## 2014-09-01 DIAGNOSIS — M1990 Unspecified osteoarthritis, unspecified site: Secondary | ICD-10-CM | POA: Insufficient documentation

## 2014-09-01 DIAGNOSIS — Z7982 Long term (current) use of aspirin: Secondary | ICD-10-CM | POA: Insufficient documentation

## 2014-09-01 DIAGNOSIS — Y939 Activity, unspecified: Secondary | ICD-10-CM | POA: Insufficient documentation

## 2014-09-01 DIAGNOSIS — E669 Obesity, unspecified: Secondary | ICD-10-CM | POA: Insufficient documentation

## 2014-09-01 DIAGNOSIS — Y929 Unspecified place or not applicable: Secondary | ICD-10-CM | POA: Insufficient documentation

## 2014-09-01 DIAGNOSIS — S39012A Strain of muscle, fascia and tendon of lower back, initial encounter: Secondary | ICD-10-CM | POA: Insufficient documentation

## 2014-09-01 MED ORDER — DEXAMETHASONE SODIUM PHOSPHATE 10 MG/ML IJ SOLN
10.0000 mg | Freq: Once | INTRAMUSCULAR | Status: AC
  Administered 2014-09-01: 10 mg via INTRAMUSCULAR
  Filled 2014-09-01: qty 1

## 2014-09-01 MED ORDER — METHOCARBAMOL 500 MG PO TABS: 500.0000 mg | ORAL_TABLET | Freq: Two times a day (BID) | ORAL | Status: DC

## 2014-09-01 MED ORDER — KETOROLAC TROMETHAMINE 60 MG/2ML IM SOLN
60.0000 mg | Freq: Once | INTRAMUSCULAR | Status: AC
  Administered 2014-09-01: 60 mg via INTRAMUSCULAR
  Filled 2014-09-01: qty 2

## 2014-09-01 MED ORDER — HYDROCODONE-IBUPROFEN 7.5-200 MG PO TABS: 1.0000 | ORAL_TABLET | Freq: Four times a day (QID) | ORAL | Status: DC | PRN

## 2014-09-01 MED ORDER — PREDNISONE 20 MG PO TABS: 40.0000 mg | ORAL_TABLET | Freq: Every day | ORAL | Status: DC

## 2014-09-01 NOTE — ED Notes (Signed)
Pt c/o pain that started out in back and moved to right hip. States its been like this since Monday. Pt states her legs get numb at times as well. Pt also c/o rib cage pain all around.

## 2014-09-01 NOTE — Discharge Instructions (Signed)
Recommend alternating ice and heat 3-4 times per day. Take prednisone as prescribed as well as Vicoprofen as prescribed for pain control. Take Robaxin for muscle spasms. Follow-up with your doctor on Monday, or early next week, for recheck of symptoms. Recommend that you walk with a cane when necessary for stability. Return to the emergency department as needed if symptoms worsen.  Back Pain, Adult Low back pain is very common. About 1 in 5 people have back pain.The cause of low back pain is rarely dangerous. The pain often gets better over time.About half of people with a sudden onset of back pain feel better in just 2 weeks. About 8 in 10 people feel better by 6 weeks.  CAUSES Some common causes of back pain include:  Strain of the muscles or ligaments supporting the spine.  Wear and tear (degeneration) of the spinal discs.  Arthritis.  Direct injury to the back. DIAGNOSIS Most of the time, the direct cause of low back pain is not known.However, back pain can be treated effectively even when the exact cause of the pain is unknown.Answering your caregiver's questions about your overall health and symptoms is one of the most accurate ways to make sure the cause of your pain is not dangerous. If your caregiver needs more information, he or she may order lab work or imaging tests (X-rays or MRIs).However, even if imaging tests show changes in your back, this usually does not require surgery. HOME CARE INSTRUCTIONS For many people, back pain returns.Since low back pain is rarely dangerous, it is often a condition that people can learn to [facility] their own.   Remain active. It is stressful on the back to sit or stand in one place. Do not sit, drive, or stand in one place for more than 30 minutes at a time. Take short walks on level surfaces as soon as pain allows.Try to increase the length of time you walk each day.  Do not stay in bed.Resting more than 1 or 2 days can delay your  recovery.  Do not avoid exercise or work.Your body is made to move.It is not dangerous to be active, even though your back may hurt.Your back will likely heal faster if you return to being active before your pain is gone.  Pay attention to your body when you bend and lift. Many people have less discomfortwhen lifting if they bend their knees, keep the load close to their bodies,and avoid twisting. Often, the most comfortable positions are those that put less stress on your recovering back.  Find a comfortable position to sleep. Use a firm mattress and lie on your side with your knees slightly bent. If you lie on your back, put a pillow under your knees.  Only take over-the-counter or prescription medicines as directed by your caregiver. Over-the-counter medicines to reduce pain and inflammation are often the most helpful.Your caregiver may prescribe muscle relaxant drugs.These medicines help dull your pain so you can more quickly return to your normal activities and healthy exercise.  Put ice on the injured area.  Put ice in a plastic bag.  Place a towel between your skin and the bag.  Leave the ice on for 15-20 minutes, 03-04 times a day for the first 2 to 3 days. After that, ice and heat may be alternated to reduce pain and spasms.  Ask your caregiver about trying back exercises and gentle massage. This may be of some benefit.  Avoid feeling anxious or stressed.Stress increases muscle tension and can  worsen back pain.It is important to recognize when you are anxious or stressed and learn ways to manage it.Exercise is a great option. SEEK MEDICAL CARE IF:  You have pain that is not relieved with rest or medicine.  You have pain that does not improve in 1 week.  You have new symptoms.  You are generally not feeling well. SEEK IMMEDIATE MEDICAL CARE IF:   You have pain that radiates from your back into your legs.  You develop new bowel or bladder control problems.  You  have unusual weakness or numbness in your arms or legs.  You develop nausea or vomiting.  You develop abdominal pain.  You feel faint. Document Released: 06/09/2005 Document Revised: 12/09/2011 Document Reviewed: 10/11/2013 [facility] Patient Information 2015 [place], [place]. This information is not intended to replace advice given to you by your health care provider. Make sure you discuss any questions you have with your health care provider.  Muscle Strain A muscle strain is an injury that occurs when a muscle is stretched beyond its normal length. Usually a small number of muscle fibers are torn when this happens. Muscle strain is rated in degrees. First-degree strains have the least amount of muscle fiber tearing and pain. Second-degree and third-degree strains have increasingly more tearing and pain.  Usually, recovery from muscle strain takes 1-2 weeks. Complete healing takes 5-6 weeks.  CAUSES  Muscle strain happens when a sudden, violent force placed on a muscle stretches it too far. This may occur with lifting, sports, or a fall.  RISK FACTORS Muscle strain is especially common in athletes.  SIGNS AND SYMPTOMS At the site of the muscle strain, there may be:  Pain.  Bruising.  Swelling.  Difficulty using the muscle due to pain or lack of normal function. DIAGNOSIS  Your health care provider will perform a physical exam and ask about your medical history. TREATMENT  Often, the best treatment for a muscle strain is resting, icing, and applying cold compresses to the injured area.  HOME CARE INSTRUCTIONS   Use the PRICE method of treatment to promote muscle healing during the first 2-3 days after your injury. The PRICE method involves:  Protecting the muscle from being injured again.  Restricting your activity and resting the injured body part.  Icing your injury. To do this, put ice in a plastic bag. Place a towel between your skin and the bag. Then, apply the ice and  leave it on from 15-20 minutes each hour. After the third day, switch to moist heat packs.  Apply compression to the injured area with a splint or elastic bandage. Be careful not to wrap it too tightly. This may interfere with blood circulation or increase swelling.  Elevate the injured body part above the level of your heart as often as you can.  Only take over-the-counter or prescription medicines for pain, discomfort, or fever as directed by your health care provider.  Warming up prior to exercise helps to prevent future muscle strains. SEEK MEDICAL CARE IF:   You have increasing pain or swelling in the injured area.  You have numbness, tingling, or a significant loss of strength in the injured area. MAKE SURE YOU:   Understand these instructions.  Will watch your condition.  Will get help right away if you are not doing well or get worse. Document Released: 06/09/2005 Document Revised: 03/30/2013 Document Reviewed: 01/06/2013 [facility] Patient Information 2015 [place], [place]. This information is not intended to replace advice given to you by your  health care provider. Make sure you discuss any questions you have with your health care provider. ° °

## 2014-09-01 NOTE — ED Provider Notes (Signed)
CSN: [id number]     Arrival date & time 09/01/14  1832 History   This chart was scribed for Antony MaduraKelly Tayvien Kane, PA-C working with Jerelyn ScottMartha Linker, MD by Evon Slackerrance Branch, ED Scribe. This patient was seen in room WTR7/WTR7 and the patient's care was started at 8:41 PM.    Chief Complaint  Patient presents with  . Hip Pain  . Back Pain   The history is provided by the patient. No language interpreter was used.   HPI Comments: Michele Gross is a 59 y.o. female with PMHx of degenerative joint disease who presents to the Emergency Department complaining of throbbing back pain that radiates down into her right hip that began 4 days ago. Pt states she has intermittent numbness described as burning in her legs as well when sleeping at night. Pt states she has been ambulating with a cane as needed for the past 4 days. Pt states she does do some heavy lifting at work. Pt states that her pain is worse when walking. Pt states she has tried extra strength Excedrin with no relief. Pt denies trauma or injury. Pt denies cancer or IV drug use. Pt denies bowel/bladder incontinence, fever or chills.   Past Medical History  Diagnosis Date  . Chest pain     Negative nuclear stress 2007, negative stress echo 2011, reported negative cath 2000; negative stress echo November 2013  . GERD (gastroesophageal reflux disease)   . Sleep apnea   . DJD (degenerative joint disease)   . Obesity   . Chronic shoulder pain   . Hypertension    Past Surgical History  Procedure Laterality Date  . Partial hysterectomy     Family History  Problem Relation Age of Onset  . Cancer Father     colon and later "throat;" in remission   History  Substance Use Topics  . Smoking status: Never Smoker   . Smokeless tobacco: Not on file  . Alcohol Use: 1.2 oz/week    2 Glasses of wine per week     Comment: occassional   OB History    No data available     Review of Systems  Constitutional: Negative for fever and chills.   Genitourinary: Negative.   Musculoskeletal: Positive for back pain and arthralgias.  All other systems reviewed and are negative.   Allergies  Acetaminophen  Home Medications   Prior to Admission medications   Medication Sig Start Date End Date Taking? Authorizing Provider  aspirin EC 81 MG EC tablet Take 1 tablet (81 mg total) by mouth daily. 05/03/12  Yes Kristie CowmanKaren Schooler, MD  metoprolol tartrate (LOPRESSOR) 25 MG tablet Take 0.5 tablets (12.5 mg total) by mouth 2 (two) times daily. 08/09/14  Yes Renne CriglerJoshua Geiple, PA-C  albuterol (PROVENTIL HFA;VENTOLIN HFA) 108 (90 BASE) MCG/ACT inhaler Inhale 2 puffs into the lungs every 6 (six) hours as needed for wheezing or shortness of breath. 04/01/14   Jennifer Piepenbrink, PA-C  guaiFENesin-codeine 100-10 MG/5ML syrup Take 10 mLs by mouth every 6 (six) hours as needed for cough. Patient not taking: Reported on 09/01/2014 04/01/14   Francee PiccoloJennifer Piepenbrink, PA-C  HYDROcodone-ibuprofen (VICOPROFEN) 7.5-200 MG per tablet Take 1 tablet by mouth every 6 (six) hours as needed for severe pain. 09/01/14   Antony MaduraKelly Addelyn Alleman, PA-C  methocarbamol (ROBAXIN) 500 MG tablet Take 1 tablet (500 mg total) by mouth 2 (two) times daily. 09/01/14   Antony MaduraKelly [place] Dani, PA-C  predniSONE (DELTASONE) 20 MG tablet Take 2 tablets (40 mg total) by mouth daily. 09/01/14  Gilverto Dileonardo, PA-C   BP 127/92 mmHg  Pulse 88  Temp(Src) 98.6 F (37 C) (Oral)  Resp 16  SpO2 99%   Physical Exam  Constitutional: She is oriented to person, place, and time. She appears well-developed and well-nourished. No distress.  Nontoxic/nonseptic appearing  HENT:  Head: Normocephalic and atraumatic.  Eyes: Conjunctivae and EOM are normal. No scleral icterus.  Neck: Normal range of motion.  Cardiovascular: Normal rate, regular rhythm and intact distal pulses.   DP and PT pulses 2+ bilaterally.  Pulmonary/Chest: Effort normal. No respiratory distress. She has no wheezes. She has no rales. She exhibits no tenderness  (No TTP to anterior or posterior R chest wall).  Respirations even and unlabored. Chest expansion symmetric.   Musculoskeletal: Normal range of motion.       Lumbar back: She exhibits tenderness. She exhibits normal range of motion, no bony tenderness, no deformity, no pain and no spasm.       Back:  Neurological: She is alert and oriented to person, place, and time.  Sensation to light touch intact. Patient ambulatory with antalgic gait.  Skin: Skin is warm and dry. No rash noted. She is not diaphoretic. No erythema. No pallor.  Psychiatric: She has a normal mood and affect. Her behavior is normal.  Nursing note and vitals reviewed.   ED Course  Procedures (including critical care time) DIAGNOSTIC STUDIES: Oxygen Saturation is 98% on RA, normal by my interpretation.    COORDINATION OF CARE: 8:46 PM-Discussed treatment plan with pt at bedside and pt agreed to plan. Patient requesting Xray of R side given pain. I explained my low suspicion for acute process given lack of tenderness. Patient wishes for this to be completed anyway. Will order.  Labs Review Labs Reviewed - No data to display  Imaging Review Dg Ribs Unilateral W/chest Right  09/01/2014   CLINICAL DATA:  59 year old female with right-sided back pain and rib pain that radiates into the hips for the past 4 days.  EXAM: RIGHT RIBS AND CHEST - 3+ VIEW  COMPARISON:  Chest x-ray 04/01/2014.  FINDINGS: Lung volumes are normal. No consolidative airspace disease. No pleural effusions. No pneumothorax. No pulmonary nodule or mass noted. Azygos lobe (normal anatomical variant) incidentally noted. Pulmonary vasculature and the cardiomediastinal silhouette are within normal limits. Atherosclerosis in the thoracic aorta.  Dedicated views of the right ribs demonstrate no acute displaced right-sided rib fractures.  IMPRESSION: 1. No acute displaced right-sided rib fractures. 2. No radiographic evidence of acute cardiopulmonary disease. 3.  Atherosclerosis.   Electronically Signed   By: Trudie Reed M.D.   On: 09/01/2014 21:56     EKG Interpretation None      MDM   Final diagnoses:  Back strain, initial encounter  Bilateral low back pain with right-sided sciatica    Patient with back pain. Likely from occupation as patient does lots of lifting at her job. Patient neurovascularly intact. Patient can walk but states is painful. She is ambulatory today with antalgic gait. No loss of bowel or bladder control. No concern for cauda equina.  No fever, hx of CA or hx of IVDU. RICE protocol and pain medicine indicated and discussed with patient. Return precautions given. Patient agreeable to plan with no unaddressed concerns. Patient discharged in good condition.  I personally performed the services described in this documentation, which was scribed in my presence. The recorded information has been reviewed and is accurate.   Filed Vitals:   09/01/14 1838 09/01/14 2225  BP: 105/76 127/92  Pulse: 105 88  Temp: 98.2 F (36.8 C) 98.6 F (37 C)  TempSrc: Oral Oral  Resp: 20 16  SpO2: 98% 99%       Antony Madura, PA-C 09/01/14 2330  Jerelyn Scott, MD 09/01/14 [id number]

## 2014-11-15 ENCOUNTER — Emergency Department (HOSPITAL_COMMUNITY): Payer: No Typology Code available for payment source

## 2014-11-15 ENCOUNTER — Encounter (HOSPITAL_COMMUNITY): Payer: Self-pay | Admitting: Emergency Medicine

## 2014-11-15 ENCOUNTER — Emergency Department (HOSPITAL_COMMUNITY)
Admission: EM | Admit: 2014-11-15 | Discharge: 2014-11-15 | Discharge status: Against Medical Advice | Payer: No Typology Code available for payment source | Attending: Emergency Medicine | Admitting: Emergency Medicine

## 2014-11-15 DIAGNOSIS — I10 Essential (primary) hypertension: Secondary | ICD-10-CM | POA: Insufficient documentation

## 2014-11-15 DIAGNOSIS — E669 Obesity, unspecified: Secondary | ICD-10-CM | POA: Insufficient documentation

## 2014-11-15 DIAGNOSIS — G8929 Other chronic pain: Secondary | ICD-10-CM | POA: Insufficient documentation

## 2014-11-15 DIAGNOSIS — R079 Chest pain, unspecified: Secondary | ICD-10-CM | POA: Insufficient documentation

## 2014-11-15 DIAGNOSIS — R112 Nausea with vomiting, unspecified: Secondary | ICD-10-CM | POA: Insufficient documentation

## 2014-11-15 LAB — COMPREHENSIVE METABOLIC PANEL
ALT: 40 U/L (ref 14–54)
AST: 31 U/L (ref 15–41)
Albumin: 3.9 g/dL (ref 3.5–5.0)
Alkaline Phosphatase: 76 U/L (ref 38–126)
Anion gap: 10 (ref 5–15)
BUN: 11 mg/dL (ref 6–20)
CO2: 23 mmol/L (ref 22–32)
Calcium: 9.5 mg/dL (ref 8.9–10.3)
Chloride: 104 mmol/L (ref 101–111)
Creatinine, Ser: 0.62 mg/dL (ref 0.44–1.00)
GFR calc Af Amer: >60 mL/min (ref >60)
GFR calc non Af Amer: >60 mL/min (ref >60)
Glucose, Bld: 110 mg/dL — ABNORMAL HIGH (ref 65–99)
Potassium: 4.2 mmol/L (ref 3.5–5.1)
Sodium: 137 mmol/L (ref 135–145)
Total Bilirubin: 0.2 mg/dL — ABNORMAL LOW (ref 0.3–1.2)
Total Protein: 8.3 g/dL — ABNORMAL HIGH (ref 6.5–8.1)

## 2014-11-15 LAB — CBC WITH DIFFERENTIAL/PLATELET
BASOS PCT: 0 % (ref 0–1)
Basophils Absolute: 0 10*3/uL (ref 0.0–0.1)
Eosinophils Absolute: 0 10*3/uL (ref 0.0–0.7)
Eosinophils Relative: 0 % (ref 0–5)
HCT: 43.7 % (ref 36.0–46.0)
HEMOGLOBIN: 14.1 g/dL (ref 12.0–15.0)
LYMPHS ABS: 2 10*3/uL (ref 0.7–4.0)
Lymphocytes Relative: 16 % (ref 12–46)
MCH: 26.6 pg (ref 26.0–34.0)
MCHC: 32.3 g/dL (ref 30.0–36.0)
MCV: 82.5 fL (ref 78.0–100.0)
MONOS PCT: 4 % (ref 3–12)
Monocytes Absolute: 0.4 10*3/uL (ref 0.1–1.0)
Neutro Abs: 9.9 10*3/uL — ABNORMAL HIGH (ref 1.7–7.7)
Neutrophils Relative %: 80 % — ABNORMAL HIGH (ref 43–77)
Platelets: 317 10*3/uL (ref 150–400)
RBC: 5.3 MIL/uL — ABNORMAL HIGH (ref 3.87–5.11)
RDW: 15.7 % — AB (ref 11.5–15.5)
WBC: 12.4 10*3/uL — ABNORMAL HIGH (ref 4.0–10.5)

## 2014-11-15 LAB — I-STAT TROPONIN, ED: Troponin i, poc: 0.01 ng/mL (ref 0.00–0.08)

## 2014-11-15 NOTE — ED Notes (Signed)
Pt sts N/V starting this am with generalized CP that is worse with vomiting

## 2014-11-15 NOTE — ED Notes (Signed)
Pt stated that she couldn't wait any loner and that she was going home. Told pt that if symptoms got worse for her to please come back

## 2015-06-15 ENCOUNTER — Emergency Department (HOSPITAL_COMMUNITY): Payer: No Typology Code available for payment source

## 2015-06-15 ENCOUNTER — Emergency Department (HOSPITAL_COMMUNITY)
Admission: EM | Admit: 2015-06-15 | Discharge: 2015-06-15 | Disposition: A | Discharge status: Home / Self Care | Payer: No Typology Code available for payment source | Attending: Emergency Medicine | Admitting: Emergency Medicine

## 2015-06-15 ENCOUNTER — Encounter (HOSPITAL_COMMUNITY): Payer: Self-pay | Admitting: Family Medicine

## 2015-06-15 DIAGNOSIS — Z8739 Personal history of other diseases of the musculoskeletal system and connective tissue: Secondary | ICD-10-CM | POA: Insufficient documentation

## 2015-06-15 DIAGNOSIS — R079 Chest pain, unspecified: Secondary | ICD-10-CM

## 2015-06-15 DIAGNOSIS — G8929 Other chronic pain: Secondary | ICD-10-CM | POA: Insufficient documentation

## 2015-06-15 DIAGNOSIS — Z79899 Other long term (current) drug therapy: Secondary | ICD-10-CM | POA: Insufficient documentation

## 2015-06-15 DIAGNOSIS — R0789 Other chest pain: Secondary | ICD-10-CM | POA: Insufficient documentation

## 2015-06-15 DIAGNOSIS — Z8719 Personal history of other diseases of the digestive system: Secondary | ICD-10-CM | POA: Insufficient documentation

## 2015-06-15 DIAGNOSIS — Z8669 Personal history of other diseases of the nervous system and sense organs: Secondary | ICD-10-CM | POA: Insufficient documentation

## 2015-06-15 DIAGNOSIS — E669 Obesity, unspecified: Secondary | ICD-10-CM | POA: Insufficient documentation

## 2015-06-15 DIAGNOSIS — Z7952 Long term (current) use of systemic steroids: Secondary | ICD-10-CM | POA: Insufficient documentation

## 2015-06-15 DIAGNOSIS — Z7982 Long term (current) use of aspirin: Secondary | ICD-10-CM | POA: Insufficient documentation

## 2015-06-15 DIAGNOSIS — I10 Essential (primary) hypertension: Secondary | ICD-10-CM | POA: Insufficient documentation

## 2015-06-15 LAB — I-STAT TROPONIN, ED: Troponin i, poc: 0 ng/mL (ref 0.00–0.08)

## 2015-06-15 LAB — BASIC METABOLIC PANEL
Anion gap: 9 (ref 5–15)
BUN: 11 mg/dL (ref 6–20)
CHLORIDE: 108 mmol/L (ref 101–111)
CO2: 25 mmol/L (ref 22–32)
Calcium: 9.8 mg/dL (ref 8.9–10.3)
Creatinine, Ser: 0.65 mg/dL (ref 0.44–1.00)
GFR calc non Af Amer: >60 mL/min (ref >60)
Glucose, Bld: 104 mg/dL — ABNORMAL HIGH (ref 65–99)
POTASSIUM: 4 mmol/L (ref 3.5–5.1)
SODIUM: 142 mmol/L (ref 135–145)

## 2015-06-15 LAB — CBC
HEMATOCRIT: 41.5 % (ref 36.0–46.0)
Hemoglobin: 13.3 g/dL (ref 12.0–15.0)
MCH: 26.9 pg (ref 26.0–34.0)
MCHC: 32 g/dL (ref 30.0–36.0)
MCV: 84 fL (ref 78.0–100.0)
Platelets: 331 10*3/uL (ref 150–400)
RBC: 4.94 MIL/uL (ref 3.87–5.11)
RDW: 15.5 % (ref 11.5–15.5)
WBC: 12.1 10*3/uL — AB (ref 4.0–10.5)

## 2015-06-15 MED ORDER — METOPROLOL TARTRATE 25 MG PO TABS: 12.5000 mg | ORAL_TABLET | Freq: Two times a day (BID) | ORAL | Status: DC

## 2015-06-15 MED ORDER — IBUPROFEN 800 MG PO TABS: 800.0000 mg | ORAL_TABLET | Freq: Three times a day (TID) | ORAL | Status: DC

## 2015-06-15 NOTE — ED Provider Notes (Signed)
CSN: [id number]     Arrival date & time 06/15/15  1045 History   First MD Initiated Contact with Patient 06/15/15 1104     Chief Complaint  Patient presents with  . Chest Pain     (Consider location/radiation/quality/duration/timing/severity/associated sxs/prior Treatment) Patient is a 59 y.o. female presenting with chest pain. The history is provided by the patient and medical records.  Chest Pain   59 year old female with history of recurrent chest wall pain, GERD, sleep apnea, degenerative joint disease, chronic shoulder pain, hypertension, presenting to the ED for chest pain. Patient states she has been having chest pain since October when she buried her grandson after he was murdered. She states pain comes and goes, is always localized to her central upper chest near her throat. She states she has some occasional shortness of breath, none currently.  She denies dizziness, diaphoresis, nausea, or vomiting. She states she has been seen by cardiology in the past and had a negative workup. She has no significant family history of cardiac disease. Patient has never been a smoker. Patient states pain began again last night after she was artery with her daughter. Patient has never been diagnosed with anxiety. She refuses to take anxiety medication even though this has been recommended by her primary care physician. She states she did talk with her relatives and her pastor after the death of her grandson which seemed to help somewhat.  VSS.  Past Medical History  Diagnosis Date  . Chest pain     Negative nuclear stress 2007, negative stress echo 2011, reported negative cath 2000; negative stress echo November 2013  . GERD (gastroesophageal reflux disease)   . Sleep apnea   . DJD (degenerative joint disease)   . Obesity   . Chronic shoulder pain   . Hypertension    Past Surgical History  Procedure Laterality Date  . Partial hysterectomy     Family History  Problem Relation Age of Onset   . Cancer Father     colon and later "throat;" in remission   Social History  Substance Use Topics  . Smoking status: Never Smoker   . Smokeless tobacco: None  . Alcohol Use: 1.2 oz/week    2 Glasses of wine per week     Comment: occassional   OB History    No data available     Review of Systems  Cardiovascular: Positive for chest pain.  All other systems reviewed and are negative.     Allergies  Acetaminophen and Percocet  Home Medications   Prior to Admission medications   Medication Sig Start Date End Date Taking? Authorizing Provider  albuterol (PROVENTIL HFA;VENTOLIN HFA) 108 (90 BASE) MCG/ACT inhaler Inhale 2 puffs into the lungs every 6 (six) hours as needed for wheezing or shortness of breath. 04/01/14   Francee PiccoloJennifer Piepenbrink, PA-C  aspirin EC 81 MG EC tablet Take 1 tablet (81 mg total) by mouth daily. 05/03/12   Kristie CowmanKaren Schooler, MD  guaiFENesin-codeine 100-10 MG/5ML syrup Take 10 mLs by mouth every 6 (six) hours as needed for cough. Patient not taking: Reported on 09/01/2014 04/01/14   Francee PiccoloJennifer Piepenbrink, PA-C  HYDROcodone-ibuprofen (VICOPROFEN) 7.5-200 MG per tablet Take 1 tablet by mouth every 6 (six) hours as needed for severe pain. 09/01/14   Antony MaduraKelly Humes, PA-C  methocarbamol (ROBAXIN) 500 MG tablet Take 1 tablet (500 mg total) by mouth 2 (two) times daily. 09/01/14   Antony MaduraKelly Humes, PA-C  metoprolol tartrate (LOPRESSOR) 25 MG tablet Take 0.5 tablets (12.5 mg  total) by mouth 2 (two) times daily. 08/09/14   Renne Crigler, PA-C  predniSONE (DELTASONE) 20 MG tablet Take 2 tablets (40 mg total) by mouth daily. 09/01/14   Antony Madura, PA-C   BP 135/87 mmHg  Pulse 90  Temp(Src) 98.4 F (36.9 C)  Resp 10  SpO2 98%   Physical Exam  Constitutional: She is oriented to person, place, and time. She appears well-developed and well-nourished.  Obese  HENT:  Head: Normocephalic and atraumatic.  Mouth/Throat: Oropharynx is clear and moist.  Eyes: Conjunctivae and EOM are  normal. Pupils are equal, round, and reactive to light.  Neck: Normal range of motion.  Cardiovascular: Normal rate, regular rhythm and normal heart sounds.   Pulmonary/Chest: Effort normal and breath sounds normal. No respiratory distress. She has no wheezes.  Reproducible tenderness of upper anterior chest wall, no deformities or signs of trauma, lungs clear bilaterally, no distress, speaking in full sentences without difficulty  Abdominal: Soft. Bowel sounds are normal.  Musculoskeletal: Normal range of motion.  Neurological: She is alert and oriented to person, place, and time.  Skin: Skin is warm and dry.  Psychiatric: She has a normal mood and affect.  Nursing note and vitals reviewed.   ED Course  Procedures (including critical care time) Labs Review Labs Reviewed  BASIC METABOLIC PANEL - Abnormal; Notable for the following:    Glucose, Bld 104 (*)    All other components within normal limits  CBC - Abnormal; Notable for the following:    WBC 12.1 (*)    All other components within normal limits  Rosezena Sensor, ED    Imaging Review Dg Chest 2 View  06/15/2015  CLINICAL DATA:  59 year old female with increasing left chest pain for several months. Initial encounter. EXAM: CHEST  2 VIEW COMPARISON:  11/15/2014 and earlier. FINDINGS: Stable somewhat low lung volumes. There is cardiomegaly. Other mediastinal contours are within normal limits. Stable tortuosity of the thoracic aorta. No pneumothorax, pulmonary edema, pleural effusion or confluent pulmonary opacity. No acute osseous abnormality identified. IMPRESSION: No acute cardiopulmonary abnormality. Electronically Signed   By: Odessa Fleming M.D.   On: 06/15/2015 11:24   I have personally reviewed and evaluated these images and lab results as part of my medical decision-making.   EKG Interpretation   Date/Time:  Friday June 15 2015 10:52:09 EST Ventricular Rate:  88 PR Interval:  142 QRS Duration: 80 QT Interval:   342 QTC Calculation: 413 R Axis:   48 Text Interpretation:  Normal sinus rhythm RSR' or QR pattern in V1  suggests right ventricular conduction delay Nonspecific ST and T wave  abnormality Abnormal ECG since last tracing no significant change  Confirmed by BELFI  MD, MELANIE ([id number]) on 06/15/2015 12:41:55 PM      MDM   Final diagnoses:  Chest pain, unspecified chest pain type   59 year old female here with recurrent chest pain since October when her grandson was murdered. Patient appears somewhat anxious on exam.  Her EKG is unchanged from prior. Workup today including labs and chest x-ray are reassuring.  Patient's vital signs have remained stable. She has been in the room talking on the phone with family members throughout entire ED visit. When I discussed her results, she began showing me pictures of her grandson on her cell phone and becomes tearful. Patient's work-up today is negative despite ongoing symptoms for over 2 months.  Her prior cardiac stress tests, echos, CT's, etc have also all been negative. I have low suspicion  for ACS, PE, dissection, or other acute cardiac event at this time.  I do suspect that some of her symptoms are anxiety related and may have been exacerbated by argument with her daughter last night. Patient still does not want to try any anxiety medications for her symptoms. I've encouraged her to continue speaking with her pastor and family members to help cope with this, especially through the holiday season. Patient also requests refill of her blood pressure medication which I given her.  Patient will follow-up with her primary care physician next week.  Discussed plan with patient, he/she acknowledged understanding and agreed with plan of care.  Return precautions given for new or worsening symptoms.  Garlon Hatchet, PA-C 06/15/15 1449  Rolan Bucco, MD 06/15/15 [phone]

## 2015-06-15 NOTE — ED Notes (Signed)
Pt ambulated to the bathroom with ease 

## 2015-06-15 NOTE — ED Notes (Signed)
Pt transported to xray 

## 2015-06-15 NOTE — ED Notes (Signed)
Pt here for chest pain that has been going on since she buried her grandchild. sts that is became worse last night when she arguing with her daughter.

## 2015-06-15 NOTE — Discharge Instructions (Signed)
Your work-up today was normal. Take the prescribed medication as directed. Follow-up with your primary care physician next week. Copies of your labs/imaging from today's visit is attached to your paper work for physician review. Return to the ED for new or worsening symptoms.

## 2015-06-15 NOTE — ED Notes (Signed)
Pt verbalized understanding of d/c instructions, prescriptions, and follow-up care. No further questions/concerns, VSS, ambulatory w/ steady gait (refused wheelchair) 

## 2015-06-15 NOTE — ED Notes (Signed)
Pt ambulatory w/ steady gait to restroom. 

## 2016-04-19 ENCOUNTER — Encounter (HOSPITAL_COMMUNITY): Payer: Self-pay | Admitting: Emergency Medicine

## 2016-04-19 ENCOUNTER — Emergency Department (HOSPITAL_COMMUNITY)
Admission: EM | Admit: 2016-04-19 | Discharge: 2016-04-19 | Disposition: A | Discharge status: Home / Self Care | Payer: Self-pay | Attending: Emergency Medicine | Admitting: Emergency Medicine

## 2016-04-19 ENCOUNTER — Emergency Department (HOSPITAL_COMMUNITY): Payer: Self-pay

## 2016-04-19 DIAGNOSIS — I10 Essential (primary) hypertension: Secondary | ICD-10-CM | POA: Insufficient documentation

## 2016-04-19 DIAGNOSIS — Z7982 Long term (current) use of aspirin: Secondary | ICD-10-CM | POA: Insufficient documentation

## 2016-04-19 DIAGNOSIS — R1084 Generalized abdominal pain: Secondary | ICD-10-CM | POA: Insufficient documentation

## 2016-04-19 LAB — URINALYSIS, ROUTINE W REFLEX MICROSCOPIC
BILIRUBIN URINE: NEGATIVE
Glucose, UA: NEGATIVE mg/dL
Hgb urine dipstick: NEGATIVE
KETONES UR: NEGATIVE mg/dL
LEUKOCYTES UA: NEGATIVE
NITRITE: NEGATIVE
PH: 6 (ref 5.0–8.0)
PROTEIN: NEGATIVE mg/dL
Specific Gravity, Urine: 1.021 (ref 1.005–1.030)

## 2016-04-19 LAB — COMPREHENSIVE METABOLIC PANEL
ALBUMIN: 3.7 g/dL (ref 3.5–5.0)
ALK PHOS: 78 U/L (ref 38–126)
ALT: 21 U/L (ref 14–54)
AST: 21 U/L (ref 15–41)
Anion gap: 7 (ref 5–15)
BILIRUBIN TOTAL: 0.2 mg/dL — AB (ref 0.3–1.2)
BUN: 9 mg/dL (ref 6–20)
CALCIUM: 9.9 mg/dL (ref 8.9–10.3)
CO2: 27 mmol/L (ref 22–32)
CREATININE: 0.75 mg/dL (ref 0.44–1.00)
Chloride: 106 mmol/L (ref 101–111)
GFR calc Af Amer: >60 mL/min (ref >60)
GLUCOSE: 119 mg/dL — AB (ref 65–99)
POTASSIUM: 4.3 mmol/L (ref 3.5–5.1)
Sodium: 140 mmol/L (ref 135–145)
TOTAL PROTEIN: 7.7 g/dL (ref 6.5–8.1)

## 2016-04-19 LAB — CBC
HEMATOCRIT: 42.6 % (ref 36.0–46.0)
Hemoglobin: 13.7 g/dL (ref 12.0–15.0)
MCH: 26.4 pg (ref 26.0–34.0)
MCHC: 32.2 g/dL (ref 30.0–36.0)
MCV: 82.1 fL (ref 78.0–100.0)
PLATELETS: 325 10*3/uL (ref 150–400)
RBC: 5.19 MIL/uL — ABNORMAL HIGH (ref 3.87–5.11)
RDW: 15.9 % — AB (ref 11.5–15.5)
WBC: 10.9 10*3/uL — AB (ref 4.0–10.5)

## 2016-04-19 LAB — LIPASE, BLOOD: LIPASE: 20 U/L (ref 11–51)

## 2016-04-19 MED ORDER — METOPROLOL TARTRATE 25 MG PO TABS: 12.5000 mg | ORAL_TABLET | Freq: Two times a day (BID) | ORAL | 0 refills | Status: DC

## 2016-04-19 MED ORDER — METOPROLOL TARTRATE 25 MG PO TABS
12.5000 mg | ORAL_TABLET | Freq: Once | ORAL | Status: AC
  Administered 2016-04-19: 12.5 mg via ORAL
  Filled 2016-04-19: qty 1

## 2016-04-19 MED ORDER — IOPAMIDOL (ISOVUE-300) INJECTION 61%
INTRAVENOUS | Status: AC
  Administered 2016-04-19: 100 mL
  Filled 2016-04-19: qty 100

## 2016-04-19 MED ORDER — KETOROLAC TROMETHAMINE 60 MG/2ML IM SOLN
60.0000 mg | Freq: Once | INTRAMUSCULAR | Status: AC
  Administered 2016-04-19: 60 mg via INTRAMUSCULAR
  Filled 2016-04-19: qty 2

## 2016-04-19 NOTE — ED Triage Notes (Signed)
Pt. Stated, I started having lower abdominal and back pain yesterday . No other symptoms except pain.  My left leg feels like its burning.

## 2016-04-19 NOTE — Discharge Instructions (Signed)
As discussed, your evaluation today has been largely reassuring.  But, it is important that you monitor your condition carefully, and do not hesitate to return to the ED if you develop new, or concerning changes in your condition. ? ?Otherwise, please follow-up with your physician for appropriate ongoing care. ? ?

## 2016-04-19 NOTE — ED Notes (Signed)
Pt ambulated to the bathroom, gait steady.

## 2016-04-19 NOTE — ED Provider Notes (Signed)
[facility] DEPT Provider Note   CSN: [id number] Arrival date & time: 04/19/16  1235     History   Chief Complaint Chief Complaint  Patient presents with  . Abdominal Pain  . Back Pain    HPI Michele Gross is a 60 y.o. female.  HPI  60 year old female presents with lower abdominal and back pain since yesterday morning. Started when she first woke up. Denies fevers, nausea, vomiting. The pain is suprapubic as well as her low back. It feels it radiates straight through. She denies any dysuria or change in the color or smell her urine. She has urinary frequency for the past 1 month. She denies hematuria. No leg weakness or numbness but has burning in left thigh for 3 weeks.  Past Medical History:  Diagnosis Date  . Chest pain    Negative nuclear stress 2007, negative stress echo 2011, reported negative cath 2000; negative stress echo November 2013  . Chronic shoulder pain   . DJD (degenerative joint disease)   . GERD (gastroesophageal reflux disease)   . Hypertension   . Obesity   . Sleep apnea     Patient Active Problem List   Diagnosis Date Noted  . Cervical radiculopathy due to degenerative joint disease of spine 05/02/2012  . Chest pain, atypical 05/02/2012  . HYPERLIPIDEMIA 07/01/2007  . GERD 07/01/2007  . BACK PAIN 07/01/2007    Past Surgical History:  Procedure Laterality Date  . PARTIAL HYSTERECTOMY      OB History    No data available       Home Medications    Prior to Admission medications   Medication Sig Start Date End Date Taking? Authorizing Provider  aspirin EC 81 MG EC tablet Take 1 tablet (81 mg total) by mouth daily. 05/03/12  Yes Kristie CowmanKaren Schooler, MD  metoprolol (LOPRESSOR) 25 MG tablet Take 0.5 tablets (12.5 mg total) by mouth 2 (two) times daily. 06/15/15  Yes Garlon HatchetLisa M Sanders, PA-C  albuterol (PROVENTIL HFA;VENTOLIN HFA) 108 (90 BASE) MCG/ACT inhaler Inhale 2 puffs into the lungs every 6 (six) hours as needed for wheezing or shortness  of breath. Patient not taking: Reported on 04/19/2016 04/01/14   Francee PiccoloJennifer Piepenbrink, PA-C  guaiFENesin-codeine 100-10 MG/5ML syrup Take 10 mLs by mouth every 6 (six) hours as needed for cough. Patient not taking: Reported on 04/19/2016 04/01/14   Francee PiccoloJennifer Piepenbrink, PA-C  HYDROcodone-ibuprofen (VICOPROFEN) 7.5-200 MG per tablet Take 1 tablet by mouth every 6 (six) hours as needed for severe pain. Patient not taking: Reported on 04/19/2016 09/01/14   Antony MaduraKelly Humes, PA-C  ibuprofen (ADVIL,MOTRIN) 800 MG tablet Take 1 tablet (800 mg total) by mouth 3 (three) times daily. Patient not taking: Reported on 04/19/2016 06/15/15   Garlon HatchetLisa M Sanders, PA-C  methocarbamol (ROBAXIN) 500 MG tablet Take 1 tablet (500 mg total) by mouth 2 (two) times daily. Patient not taking: Reported on 04/19/2016 09/01/14   Antony MaduraKelly Humes, PA-C  predniSONE (DELTASONE) 20 MG tablet Take 2 tablets (40 mg total) by mouth daily. Patient not taking: Reported on 04/19/2016 09/01/14   Antony MaduraKelly Humes, PA-C    Family History Family History  Problem Relation Age of Onset  . Cancer Father     colon and later "throat;" in remission    Social History Social History  Substance Use Topics  . Smoking status: Never Smoker  . Smokeless tobacco: Never Used  . Alcohol use 1.2 oz/week    2 Glasses of wine per week     Comment: occassional  Allergies   Acetaminophen and Percocet [oxycodone-acetaminophen]   Review of Systems Review of Systems  Constitutional: Negative for fever.  Gastrointestinal: Positive for abdominal pain. Negative for constipation, diarrhea, nausea and vomiting.  Genitourinary: Positive for frequency. Negative for dysuria.  Musculoskeletal: Positive for back pain.  All other systems reviewed and are negative.    Physical Exam Updated Vital Signs BP 132/88 (BP Location: Left Arm)   Pulse 68   Temp 98.5 F (36.9 C) (Oral)   Resp 22   Ht 5\' 4"  (1.626 m)   Wt 279 lb (126.6 kg)   SpO2 100%   BMI 47.89  kg/m   Physical Exam  Constitutional: She is oriented to person, place, and time. She appears well-developed and well-nourished.  obese  HENT:  Head: Normocephalic and atraumatic.  Right Ear: External ear normal.  Left Ear: External ear normal.  Nose: Nose normal.  Eyes: Right eye exhibits no discharge. Left eye exhibits no discharge.  Cardiovascular: Normal rate, regular rhythm and normal heart sounds.   Pulmonary/Chest: Effort normal and breath sounds normal.  Abdominal: Soft. There is tenderness in the suprapubic area. There is CVA tenderness (bilateral).  Musculoskeletal:       Lumbar back: She exhibits tenderness.  Neurological: She is alert and oriented to person, place, and time.  Skin: Skin is warm and dry.  Nursing note and vitals reviewed.    ED Treatments / Results  Labs (all labs ordered are listed, but only abnormal results are displayed) Labs Reviewed  COMPREHENSIVE METABOLIC PANEL - Abnormal; Notable for the following:       Result Value   Glucose, Bld 119 (*)    Total Bilirubin 0.2 (*)    All other components within normal limits  CBC - Abnormal; Notable for the following:    WBC 10.9 (*)    RBC 5.19 (*)    RDW 15.9 (*)    All other components within normal limits  LIPASE, BLOOD  URINALYSIS, ROUTINE W REFLEX MICROSCOPIC (NOT AT [facility])    EKG  EKG Interpretation None       Radiology No results found.  Procedures Procedures (including critical care time)  Medications Ordered in ED Medications  ketorolac (TORADOL) injection 60 mg (60 mg Intramuscular Given 04/19/16 1422)  iopamidol (ISOVUE-300) 61 % injection (100 mLs  Contrast Given 04/19/16 1612)     Initial Impression / Assessment and Plan / ED Course  I have reviewed the triage vital signs and the nursing notes.  Pertinent labs & imaging results that were available during my care of the patient were reviewed by me and considered in my medical decision making (see chart for  details).  Clinical Course  Comment By Time  Given flank pain and suprapubic pain this is probably urinary. Will get urine, give IM toradol. If urine negative, will need CT.  Pricilla LovelessScott Valleri Hendricksen, MD 10/28 1416    Given negative urine, will get CT. Care transferred to Dr. Jeraldine LootsLockwood with CT pending.  Final Clinical Impressions(s) / ED Diagnoses   Final diagnoses:  None    New Prescriptions New Prescriptions   No medications on file     Pricilla LovelessScott Abena Erdman, MD 04/19/16 1943

## 2016-04-19 NOTE — ED Provider Notes (Signed)
Patient awake, alert, in NAD.  She is aware of CT results.  She requests refill of HTN meds.  She will f/u w PMD.   Gerhard Munchobert Markeya Mincy, MD 04/19/16 [phone]

## 2016-12-21 ENCOUNTER — Encounter (HOSPITAL_COMMUNITY): Payer: Self-pay | Admitting: Emergency Medicine

## 2016-12-21 ENCOUNTER — Other Ambulatory Visit: Payer: Self-pay

## 2016-12-21 ENCOUNTER — Emergency Department (HOSPITAL_COMMUNITY)
Admission: EM | Admit: 2016-12-21 | Discharge: 2016-12-21 | Disposition: A | Discharge status: Home / Self Care | Payer: No Typology Code available for payment source | Attending: Emergency Medicine | Admitting: Emergency Medicine

## 2016-12-21 ENCOUNTER — Emergency Department (HOSPITAL_COMMUNITY): Payer: No Typology Code available for payment source

## 2016-12-21 DIAGNOSIS — R42 Dizziness and giddiness: Secondary | ICD-10-CM | POA: Insufficient documentation

## 2016-12-21 DIAGNOSIS — M722 Plantar fascial fibromatosis: Secondary | ICD-10-CM | POA: Insufficient documentation

## 2016-12-21 DIAGNOSIS — M79604 Pain in right leg: Secondary | ICD-10-CM | POA: Insufficient documentation

## 2016-12-21 DIAGNOSIS — Z79899 Other long term (current) drug therapy: Secondary | ICD-10-CM | POA: Insufficient documentation

## 2016-12-21 DIAGNOSIS — I10 Essential (primary) hypertension: Secondary | ICD-10-CM | POA: Insufficient documentation

## 2016-12-21 LAB — BASIC METABOLIC PANEL
Anion gap: 6 (ref 5–15)
BUN: 10 mg/dL (ref 6–20)
CALCIUM: 9.1 mg/dL (ref 8.9–10.3)
CHLORIDE: 107 mmol/L (ref 101–111)
CO2: 24 mmol/L (ref 22–32)
CREATININE: 0.63 mg/dL (ref 0.44–1.00)
GFR calc Af Amer: >60 mL/min (ref >60)
GFR calc non Af Amer: >60 mL/min (ref >60)
GLUCOSE: 110 mg/dL — AB (ref 65–99)
Potassium: 3.5 mmol/L (ref 3.5–5.1)
Sodium: 137 mmol/L (ref 135–145)

## 2016-12-21 LAB — URINALYSIS, ROUTINE W REFLEX MICROSCOPIC
Bilirubin Urine: NEGATIVE
GLUCOSE, UA: NEGATIVE mg/dL
KETONES UR: NEGATIVE mg/dL
Leukocytes, UA: NEGATIVE
Nitrite: NEGATIVE
PH: 5 (ref 5.0–8.0)
PROTEIN: NEGATIVE mg/dL
Specific Gravity, Urine: 1.024 (ref 1.005–1.030)

## 2016-12-21 LAB — CBC
HCT: 40.3 % (ref 36.0–46.0)
Hemoglobin: 13.1 g/dL (ref 12.0–15.0)
MCH: 26.7 pg (ref 26.0–34.0)
MCHC: 32.5 g/dL (ref 30.0–36.0)
MCV: 82.2 fL (ref 78.0–100.0)
PLATELETS: 298 10*3/uL (ref 150–400)
RBC: 4.9 MIL/uL (ref 3.87–5.11)
RDW: 15.6 % — ABNORMAL HIGH (ref 11.5–15.5)
WBC: 10 10*3/uL (ref 4.0–10.5)

## 2016-12-21 MED ORDER — HYDROCODONE-ACETAMINOPHEN 5-325 MG PO TABS
1.0000 | ORAL_TABLET | Freq: Once | ORAL | Status: AC
  Administered 2016-12-21: 1 via ORAL
  Filled 2016-12-21: qty 1

## 2016-12-21 MED ORDER — METOPROLOL TARTRATE 25 MG PO TABS: 12.5000 mg | ORAL_TABLET | Freq: Two times a day (BID) | ORAL | 2 refills | Status: DC

## 2016-12-21 MED ORDER — HYDROCODONE-ACETAMINOPHEN 5-325 MG PO TABS: 1.0000 | ORAL_TABLET | Freq: Four times a day (QID) | ORAL | 0 refills | Status: DC | PRN

## 2016-12-21 NOTE — Discharge Instructions (Signed)
Follow-up with podiatry. Dr. Logan BoresEvans a call for follow-up. Take pain medicine as directed. Restart her blood pressure medicine. Make an appointment to follow-up with the wellness clinic for follow-up of your blood pressure.

## 2016-12-21 NOTE — ED Notes (Signed)
Pt states she has worked the past 12 days for 12 hour shifts, working on her feet the majority of the time. Pt complains of pain to bottom and outer side of her feet. Pt does not have any redness, swelling noted to feet. Pt able to stand and bear weight without difficulty.

## 2016-12-21 NOTE — ED Notes (Signed)
Pt ambulated to bathroom without difficulty.

## 2016-12-21 NOTE — ED Provider Notes (Signed)
[facility] Provider Note   CSN: [id number] Arrival date & time: 12/21/16  1300     History   Chief Complaint Chief Complaint  Patient presents with  . Dizziness  . Leg Pain    HPI Michele Gross is a 61 y.o. female.  Patient with complaint of bilateral foot pain. Much worse when she first puts her feet on the floor in the morning. No history of any injury. Patient has a history of some chronic back pain problems. No numbness. Pain radiates from the feet up In the back down. Also associated with some dizziness no room spinning. No headache no speech problems no motor weakness problem. Dizziness has completely resolved currently. Patient is a history of hypertension. Been out of her hypertensive meds for several months. Patient denies any chest pain or shortness of breath. Denies any significant headache. figured he did      Past Medical History:  Diagnosis Date  . Chest pain    Negative nuclear stress 2007, negative stress echo 2011, reported negative cath 2000; negative stress echo November 2013  . Chronic shoulder pain   . DJD (degenerative joint disease)   . GERD (gastroesophageal reflux disease)   . Hypertension   . Obesity   . Sleep apnea     Patient Active Problem List   Diagnosis Date Noted  . Cervical radiculopathy due to degenerative joint disease of spine 05/02/2012  . Chest pain, atypical 05/02/2012  . HYPERLIPIDEMIA 07/01/2007  . GERD 07/01/2007  . BACK PAIN 07/01/2007    Past Surgical History:  Procedure Laterality Date  . PARTIAL HYSTERECTOMY      OB History    No data available       Home Medications    Prior to Admission medications   Medication Sig Start Date End Date Taking? Authorizing Provider  aspirin EC 81 MG EC tablet Take 1 tablet (81 mg total) by mouth daily. 05/03/12  Yes Kristie Cowman, MD  metoprolol tartrate (LOPRESSOR) 25 MG tablet Take 0.5 tablets (12.5 mg total) by mouth 2 (two) times daily. 04/19/16  Yes  Gerhard Munch, MD  naproxen sodium (ALEVE) 220 MG tablet Take 220 mg by mouth 2 (two) times daily as needed (pain).   Yes [provider]  albuterol (PROVENTIL HFA;VENTOLIN HFA) 108 (90 BASE) MCG/ACT inhaler Inhale 2 puffs into the lungs every 6 (six) hours as needed for wheezing or shortness of breath. Patient not taking: Reported on 04/19/2016 04/01/14   Piepenbrink, Victorino Dike, PA-C  guaiFENesin-codeine 100-10 MG/5ML syrup Take 10 mLs by mouth every 6 (six) hours as needed for cough. Patient not taking: Reported on 04/19/2016 04/01/14   Piepenbrink, Victorino Dike, PA-C  HYDROcodone-acetaminophen (NORCO/VICODIN) 5-325 MG tablet Take 1-2 tablets by mouth every 6 (six) hours as needed for moderate pain. 12/21/16   Vanetta Mulders, MD  HYDROcodone-ibuprofen (VICOPROFEN) 7.5-200 MG per tablet Take 1 tablet by mouth every 6 (six) hours as needed for severe pain. Patient not taking: Reported on 04/19/2016 09/01/14   Antony Madura, PA-C  ibuprofen (ADVIL,MOTRIN) 800 MG tablet Take 1 tablet (800 mg total) by mouth 3 (three) times daily. Patient not taking: Reported on 04/19/2016 06/15/15   Garlon Hatchet, PA-C  methocarbamol (ROBAXIN) 500 MG tablet Take 1 tablet (500 mg total) by mouth 2 (two) times daily. Patient not taking: Reported on 04/19/2016 09/01/14   Antony Madura, PA-C  metoprolol tartrate (LOPRESSOR) 25 MG tablet Take 0.5 tablets (12.5 mg total) by mouth 2 (two) times daily. 12/21/16  Vanetta Mulders, MD    Family History Family History  Problem Relation Age of Onset  . Cancer Father        colon and later "throat;" in remission    Social History Social History  Substance Use Topics  . Smoking status: Never Smoker  . Smokeless tobacco: Never Used  . Alcohol use 1.2 oz/week    2 Glasses of wine per week     Comment: occassional     Allergies   Acetaminophen and Percocet [oxycodone-acetaminophen]   Review of Systems Review of Systems  Constitutional: Negative for fever.    HENT: Negative for congestion.   Eyes: Negative for redness.  Cardiovascular: Negative for leg swelling.  Gastrointestinal: Negative for abdominal pain.  Genitourinary: Negative for dysuria.  Musculoskeletal: Positive for back pain.  Skin: Negative for rash.  Neurological: Positive for dizziness. Negative for syncope and headaches.  Hematological: Does not bruise/bleed easily.  Psychiatric/Behavioral: Negative for confusion.     Physical Exam Updated Vital Signs BP (!) 155/96 (BP Location: Left Arm)   Pulse 82   Temp 98.2 F (36.8 C) (Oral)   Resp 18   Ht 1.6 m (5\' 3" )   Wt 124.7 kg (275 lb)   SpO2 97%   BMI 48.71 kg/m   Physical Exam  Constitutional: She is oriented to person, place, and time. She appears well-developed and well-nourished. No distress.  HENT:  Head: Normocephalic and atraumatic.  Mouth/Throat: Oropharynx is clear and moist.  Eyes: Conjunctivae and EOM are normal. Pupils are equal, round, and reactive to light.  Neck: Normal range of motion. Neck supple.  Cardiovascular: Normal rate, regular rhythm and normal heart sounds.   Pulmonary/Chest: Effort normal and breath sounds normal. No respiratory distress.  Abdominal: Soft. Bowel sounds are normal. There is no tenderness.  Musculoskeletal: Normal range of motion. She exhibits no edema.  Dorsalis pedis pulses are 2+ of both feet. Good cap refill. No obvious deformity no significant swelling. Sensation intact. Most the pain is on the bottom of the feet.  Neurological: She is alert and oriented to person, place, and time. No cranial nerve deficit or sensory deficit. She exhibits normal muscle tone. Coordination normal.  Skin: Skin is warm.  Nursing note and vitals reviewed.    ED Treatments / Results  Labs (all labs ordered are listed, but only abnormal results are displayed) Labs Reviewed  BASIC METABOLIC PANEL - Abnormal; Notable for the following:       Result Value   Glucose, Bld 110 (*)    All  other components within normal limits  CBC - Abnormal; Notable for the following:    RDW 15.6 (*)    All other components within normal limits  URINALYSIS, ROUTINE W REFLEX MICROSCOPIC - Abnormal; Notable for the following:    Hgb urine dipstick SMALL (*)    Bacteria, UA RARE (*)    Squamous Epithelial / LPF 0-5 (*)    All other components within normal limits  CBG MONITORING, ED    EKG  EKG Interpretation  Date/Time:  Sunday December 21 2016 13:21:18 EDT Ventricular Rate:  74 PR Interval:  142 QRS Duration: 94 QT Interval:  398 QTC Calculation: 441 R Axis:   56 Text Interpretation:  Normal sinus rhythm RSR' or QR pattern in V1 suggests right ventricular conduction delay Nonspecific T wave abnormality Abnormal ECG No significant change since last tracing Confirmed by Vanetta Mulders [phone]) on 12/21/2016 3:37:23 PM       Radiology Dg Foot  Complete Left  Result Date: 12/21/2016 CLINICAL DATA:  Bilateral feet pain for 3 days. EXAM: LEFT FOOT - COMPLETE 3+ VIEW COMPARISON:  None. FINDINGS: There is no evidence of fracture or dislocation. There is no evidence of focal bone abnormality. Osteoarthritic changes at the first metatarsophalangeal joint and tarsometatarsal joints. Large calcaneal spurs. Soft tissues are unremarkable. IMPRESSION: Multilevel osteoarthritic changes. No acute fracture or dislocation identified about the left foot. Electronically Signed   By: Ted Mcalpineobrinka  Dimitrova M.D.   On: 12/21/2016 16:09   Dg Foot Complete Right  Result Date: 12/21/2016 CLINICAL DATA:  Foot pain for 3 days. EXAM: RIGHT FOOT COMPLETE - 3+ VIEW COMPARISON:  04/01/2014 FINDINGS: There is no evidence of fracture or dislocation. There is no evidence of focal bone abnormality. Osteoarthritic changes at the first metatarsophalangeal joint, intertarsal and tarsometatarsal joints. Calcaneal spurs noted. Soft tissues are unremarkable. IMPRESSION: Multilevel osteoarthritic changes. No evidence of fracture  dislocation. Electronically Signed   By: Ted Mcalpineobrinka  Dimitrova M.D.   On: 12/21/2016 16:08    Procedures Procedures (including critical care time)  Medications Ordered in ED Medications  HYDROcodone-acetaminophen (NORCO/VICODIN) 5-325 MG per tablet 1 tablet (not administered)     Initial Impression / Assessment and Plan / ED Course  I have reviewed the triage vital signs and the nursing notes.  Pertinent labs & imaging results that were available during my care of the patient were reviewed by me and considered in my medical decision making (see chart for details).     Foot symptoms seem to be very consistent with a plantar fasciitis. X-rays of both feet do show some osteoarthritic changes. We'll have patient follow-up with podiatry for further treatment. Short course of pain medicine provided.  The patient states she is out of her beta blocker for hypertension. Prescription renewed. Referral to wellness clinic for further follow-up of the blood pressure.  Patient's dizziness completely resolved by the time she was here. Not able to elicit any recurrent dizziness. No significant neuro focal findings. By history it was not consistent with vertigo.  Final Clinical Impressions(s) / ED Diagnoses   Final diagnoses:  Bilateral plantar fasciitis  Essential hypertension    New Prescriptions New Prescriptions   HYDROCODONE-ACETAMINOPHEN (NORCO/VICODIN) 5-325 MG TABLET    Take 1-2 tablets by mouth every 6 (six) hours as needed for moderate pain.   METOPROLOL TARTRATE (LOPRESSOR) 25 MG TABLET    Take 0.5 tablets (12.5 mg total) by mouth 2 (two) times daily.     Vanetta MuldersZackowski, Ardean Melroy, MD 12/21/16 1730

## 2016-12-21 NOTE — ED Notes (Signed)
Patient transported to X-ray 

## 2016-12-21 NOTE — ED Triage Notes (Signed)
Onset one day ago developed dizziness and bilateral lower extremity pain 10/10 achy states pain worsening while weight bearing after getting out of bed.

## 2017-08-10 ENCOUNTER — Ambulatory Visit (HOSPITAL_COMMUNITY)
Admission: EM | Admit: 2017-08-10 | Discharge: 2017-08-10 | Disposition: A | Discharge status: Home / Self Care | Payer: No Typology Code available for payment source | Attending: Family Medicine | Admitting: Family Medicine

## 2017-08-10 ENCOUNTER — Encounter (HOSPITAL_COMMUNITY): Payer: Self-pay | Admitting: Emergency Medicine

## 2017-08-10 DIAGNOSIS — B9789 Other viral agents as the cause of diseases classified elsewhere: Secondary | ICD-10-CM

## 2017-08-10 DIAGNOSIS — J069 Acute upper respiratory infection, unspecified: Secondary | ICD-10-CM

## 2017-08-10 MED ORDER — PREDNISONE 20 MG PO TABS: ORAL_TABLET | ORAL | 0 refills | Status: DC

## 2017-08-10 MED ORDER — HYDROCODONE-HOMATROPINE 5-1.5 MG/5ML PO SYRP: 5.0000 mL | ORAL_SOLUTION | Freq: Four times a day (QID) | ORAL | 0 refills | Status: DC | PRN

## 2017-08-10 MED ORDER — ALBUTEROL SULFATE HFA 108 (90 BASE) MCG/ACT IN AERS: 2.0000 | INHALATION_SPRAY | RESPIRATORY_TRACT | 1 refills | Status: DC | PRN

## 2017-08-10 MED ORDER — METOPROLOL TARTRATE 25 MG PO TABS: 12.5000 mg | ORAL_TABLET | Freq: Two times a day (BID) | ORAL | 3 refills | Status: DC

## 2017-08-10 NOTE — ED Triage Notes (Signed)
PT reports cough and body aches for 2 weeks.

## 2017-08-10 NOTE — ED Provider Notes (Signed)
[facility]   [id number] 08/10/17 Arrival Time: 1343   SUBJECTIVE:  Michele Gross is a 62 y.o. female who presents to the urgent care with complaint of cough and need for refill on blood pressure medication.  Cough and body aches for 2 weeks, often productive, worse at night, associated with wheezes.  No fever  Works as Lawyer  Past Medical History:  Diagnosis Date  . Chest pain    Negative nuclear stress 2007, negative stress echo 2011, reported negative cath 2000; negative stress echo November 2013  . Chronic shoulder pain   . DJD (degenerative joint disease)   . GERD (gastroesophageal reflux disease)   . Hypertension   . Obesity   . Sleep apnea    Family History  Problem Relation Age of Onset  . Cancer Father        colon and later "throat;" in remission   Social History   Socioeconomic History  . Marital status: Married    Spouse name: Not on file  . Number of children: Not on file  . Years of education: Not on file  . Highest education level: Not on file  Social Needs  . Financial resource strain: Not on file  . Food insecurity - worry: Not on file  . Food insecurity - inability: Not on file  . Transportation needs - medical: Not on file  . Transportation needs - non-medical: Not on file  Occupational History  . 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 Smoking status: Never Smoker  . Smokeless tobacco: Never Used  Substance and Sexual Activity  . Alcohol use: Yes    Alcohol/week: 1.2 oz    Types: 2 Glasses of wine per week    Comment: occassional  . Drug use: No  . Sexual activity: Not Currently  Other Topics Concern  . Not on file  Social History Narrative  . Not on file   Current Meds  Medication Sig  . aspirin EC 81 MG EC tablet Take 1 tablet (81 mg total) by mouth daily.  . [DISCONTINUED] ibuprofen (ADVIL,MOTRIN) 800 MG tablet Take 1 tablet (800 mg total) by mouth 3 (three)  times daily.  . [DISCONTINUED] metoprolol tartrate (LOPRESSOR) 25 MG tablet Take 0.5 tablets (12.5 mg total) by mouth 2 (two) times daily.   Allergies  Allergen Reactions  . Acetaminophen Other (See Comments)    Pt told not to take with blood pressure medicine  . Percocet [Oxycodone-Acetaminophen] Other (See Comments)    hallucinations      ROS: As per HPI, remainder of ROS negative.   OBJECTIVE:   Vitals:   08/10/17 1517 08/10/17 1518  BP:  130/86  Pulse:  70  Resp:  16  Temp:  98.5 F (36.9 C)  SpO2:  100%  Weight: 275 lb (124.7 kg)      General appearance: alert; no distress Eyes: PERRL; EOMI; conjunctiva normal HENT: normocephalic; atraumatic; TMs normal, canal normal, external ears normal without trauma; nasal mucosa normal; oral mucosa normal Neck: supple Lungs: expiratory wheezes Heart: regular rate and rhythm Abdomen: soft, non-tender; bowel sounds normal; no masses or organomegaly; no guarding or rebound tenderness Back: no CVA tenderness Extremities: no cyanosis or edema; symmetrical with no gross deformities Skin: warm and dry Neurologic: normal gait; grossly normal Psychological: alert and cooperative; normal mood and affect      Labs:  Results for orders placed or performed during the  hospital encounter of 12/21/16  Basic metabolic panel  Result Value Ref Range   Sodium 137 135 - 145 mmol/L   Potassium 3.5 3.5 - 5.1 mmol/L   Chloride 107 101 - 111 mmol/L   CO2 24 22 - 32 mmol/L   Glucose, Bld 110 (H) 65 - 99 mg/dL   BUN 10 6 - 20 mg/dL   Creatinine, Ser 9.600.63 0.44 - 1.00 mg/dL   Calcium 9.1 8.9 - 45.410.3 mg/dL   GFR calc non Af Amer >60 >60 mL/min   GFR calc Af Amer >60 >60 mL/min   Anion gap 6 5 - 15  CBC  Result Value Ref Range   WBC 10.0 4.0 - 10.5 K/uL   RBC 4.90 3.87 - 5.11 MIL/uL   Hemoglobin 13.1 12.0 - 15.0 g/dL   HCT 09.840.3 11.936.0 - 14.746.0 %   MCV 82.2 78.0 - 100.0 fL   MCH 26.7 26.0 - 34.0 pg   MCHC 32.5 30.0 - 36.0 g/dL   RDW 82.915.6  (H) 56.211.5 - 15.5 %   Platelets 298 150 - 400 K/uL  Urinalysis, Routine w reflex microscopic  Result Value Ref Range   Color, Urine YELLOW YELLOW   APPearance CLEAR CLEAR   Specific Gravity, Urine 1.024 1.005 - 1.030   pH 5.0 5.0 - 8.0   Glucose, UA NEGATIVE NEGATIVE mg/dL   Hgb urine dipstick SMALL (A) NEGATIVE   Bilirubin Urine NEGATIVE NEGATIVE   Ketones, ur NEGATIVE NEGATIVE mg/dL   Protein, ur NEGATIVE NEGATIVE mg/dL   Nitrite NEGATIVE NEGATIVE   Leukocytes, UA NEGATIVE NEGATIVE   RBC / HPF 0-5 0 - 5 RBC/hpf   WBC, UA 0-5 0 - 5 WBC/hpf   Bacteria, UA RARE (A) NONE SEEN   Squamous Epithelial / LPF 0-5 (A) NONE SEEN   Mucus PRESENT     Labs Reviewed - No data to display  No results found.     ASSESSMENT & PLAN:  1. Viral URI with cough     Meds ordered this encounter  Medications  . metoprolol tartrate (LOPRESSOR) 25 MG tablet    Sig: Take 0.5 tablets (12.5 mg total) by mouth 2 (two) times daily.    Dispense:  180 tablet    Refill:  3  . predniSONE (DELTASONE) 20 MG tablet    Sig: Two daily with food    Dispense:  10 tablet    Refill:  0  . albuterol (PROVENTIL HFA;VENTOLIN HFA) 108 (90 Base) MCG/ACT inhaler    Sig: Inhale 2 puffs into the lungs every 4 (four) hours as needed for wheezing or shortness of breath (cough, shortness of breath or wheezing.).    Dispense:  1 Inhaler    Refill:  1  . HYDROcodone-homatropine (HYDROMET) 5-1.5 MG/5ML syrup    Sig: Take 5 mLs by mouth every 6 (six) hours as needed for cough.    Dispense:  60 mL    Refill:  0    Reviewed expectations re: course of current medical issues. Questions answered. Outlined signs and symptoms indicating need for more acute intervention. Patient verbalized understanding. After Visit Summary given.       Elvina SidleLauenstein, Shalayna Ornstein, MD 08/10/17 1546

## 2017-08-10 NOTE — ED Triage Notes (Signed)
PT also needs metoprolol refilled

## 2017-12-01 ENCOUNTER — Other Ambulatory Visit: Payer: Self-pay

## 2017-12-01 ENCOUNTER — Emergency Department (HOSPITAL_COMMUNITY): Payer: Medicaid Other

## 2017-12-01 ENCOUNTER — Emergency Department (HOSPITAL_COMMUNITY)
Admission: EM | Admit: 2017-12-01 | Discharge: 2017-12-01 | Disposition: A | Discharge status: Home / Self Care | Payer: Medicaid Other | Attending: Emergency Medicine | Admitting: Emergency Medicine

## 2017-12-01 ENCOUNTER — Encounter (HOSPITAL_COMMUNITY): Payer: Self-pay | Admitting: Emergency Medicine

## 2017-12-01 DIAGNOSIS — M79606 Pain in leg, unspecified: Secondary | ICD-10-CM | POA: Diagnosis not present

## 2017-12-01 DIAGNOSIS — Z7982 Long term (current) use of aspirin: Secondary | ICD-10-CM | POA: Insufficient documentation

## 2017-12-01 DIAGNOSIS — R079 Chest pain, unspecified: Secondary | ICD-10-CM | POA: Diagnosis not present

## 2017-12-01 DIAGNOSIS — Z79899 Other long term (current) drug therapy: Secondary | ICD-10-CM | POA: Insufficient documentation

## 2017-12-01 DIAGNOSIS — I10 Essential (primary) hypertension: Secondary | ICD-10-CM | POA: Diagnosis not present

## 2017-12-01 DIAGNOSIS — M25511 Pain in right shoulder: Secondary | ICD-10-CM | POA: Insufficient documentation

## 2017-12-01 LAB — BASIC METABOLIC PANEL
ANION GAP: 7 (ref 5–15)
BUN: 10 mg/dL (ref 6–20)
CO2: 27 mmol/L (ref 22–32)
Calcium: 9.5 mg/dL (ref 8.9–10.3)
Chloride: 107 mmol/L (ref 101–111)
Creatinine, Ser: 0.73 mg/dL (ref 0.44–1.00)
GFR calc Af Amer: >60 mL/min (ref >60)
GFR calc non Af Amer: >60 mL/min (ref >60)
GLUCOSE: 98 mg/dL (ref 65–99)
POTASSIUM: 4 mmol/L (ref 3.5–5.1)
Sodium: 141 mmol/L (ref 135–145)

## 2017-12-01 LAB — I-STAT BETA HCG BLOOD, ED (MC, WL, AP ONLY): I-stat hCG, quantitative: <5 m[IU]/mL (ref <5)

## 2017-12-01 LAB — CBC
HEMATOCRIT: 44.6 % (ref 36.0–46.0)
HEMOGLOBIN: 13.9 g/dL (ref 12.0–15.0)
MCH: 26 pg (ref 26.0–34.0)
MCHC: 31.2 g/dL (ref 30.0–36.0)
MCV: 83.4 fL (ref 78.0–100.0)
Platelets: 305 10*3/uL (ref 150–400)
RBC: 5.35 MIL/uL — ABNORMAL HIGH (ref 3.87–5.11)
RDW: 15.7 % — ABNORMAL HIGH (ref 11.5–15.5)
WBC: 11.2 10*3/uL — ABNORMAL HIGH (ref 4.0–10.5)

## 2017-12-01 LAB — I-STAT TROPONIN, ED
TROPONIN I, POC: 0.01 ng/mL (ref 0.00–0.08)
Troponin i, poc: 0 ng/mL (ref 0.00–0.08)

## 2017-12-01 LAB — CK: CK TOTAL: 91 U/L (ref 38–234)

## 2017-12-01 MED ORDER — DIAZEPAM 5 MG/ML IJ SOLN
2.5000 mg | Freq: Once | INTRAMUSCULAR | Status: AC
  Administered 2017-12-01: 2.5 mg via INTRAVENOUS
  Filled 2017-12-01: qty 2

## 2017-12-01 MED ORDER — PREDNISONE 20 MG PO TABS: ORAL_TABLET | ORAL | 0 refills | Status: DC

## 2017-12-01 MED ORDER — MORPHINE SULFATE (PF) 4 MG/ML IV SOLN
4.0000 mg | Freq: Once | INTRAVENOUS | Status: AC
  Administered 2017-12-01: 4 mg via INTRAVENOUS
  Filled 2017-12-01: qty 1

## 2017-12-01 MED ORDER — HYDROCODONE-ACETAMINOPHEN 5-325 MG PO TABS: 1.0000 | ORAL_TABLET | Freq: Four times a day (QID) | ORAL | 0 refills | Status: DC | PRN

## 2017-12-01 MED ORDER — IBUPROFEN 600 MG PO TABS: 600.0000 mg | ORAL_TABLET | Freq: Four times a day (QID) | ORAL | 0 refills | Status: DC | PRN

## 2017-12-01 NOTE — Discharge Instructions (Signed)
Take prednisone as prescribed.   Use sling for comfort but please try and move your arm.   See your doctor   Return to ER if you have worse arm pain, leg pain, weakness, chest pain

## 2017-12-01 NOTE — ED Notes (Signed)
Patient transported to X-ray 

## 2017-12-01 NOTE — Progress Notes (Signed)
Orthopedic Tech Progress Note Patient Details:  Michele GillisDebra D Gross [birth year] [id number]  Ortho Devices Type of Ortho Device: Arm sling Ortho Device/Splint Location: RUE Ortho Device/Splint Interventions: Ordered, Application   Post Interventions Patient Tolerated: Well Instructions Provided: Care of device   Michele MoccasinHughes, Michele Gross 12/01/2017, 3:57 PM

## 2017-12-01 NOTE — ED Notes (Signed)
Patient did not respond when called to reassess vital signs.  

## 2017-12-01 NOTE — ED Triage Notes (Signed)
Pt. Stated, Ive had leg pain for a month, my feet and arms are hurting since Sunday.

## 2017-12-01 NOTE — ED Provider Notes (Signed)
[facility] Provider Note   CSN: [id number] Arrival date & time: 12/01/17  [id number]     History   Chief Complaint Chief Complaint  Patient presents with  . Foot Pain  . Leg Pain  . Arm Pain    HPI Michele Gross is a 62 y.o. female history of reflux, hypertension presenting with leg pain, right shoulder pain.  Patient states that she has lateral leg pain for the last month or so.  Patient states that it is intermittent and progressively getting worse.  This morning she had acute onset of right shoulder pain and chest pain.  She denies any trauma or injury.  He denies any abdominal pain or back pain.  Patient has history of degenerative joint disease and previous reproducible chest pain.  She also was seen previously for her leg pain and was thought to have some claudication but never saw a vascular doctor. Denies trouble urinating or weakness.   The history is provided by the patient.    Past Medical History:  Diagnosis Date  . Chest pain    Negative nuclear stress 2007, negative stress echo 2011, reported negative cath 2000; negative stress echo November 2013  . Chronic shoulder pain   . DJD (degenerative joint disease)   . GERD (gastroesophageal reflux disease)   . Hypertension   . Obesity   . Sleep apnea     Patient Active Problem List   Diagnosis Date Noted  . Cervical radiculopathy due to degenerative joint disease of spine 05/02/2012  . Chest pain, atypical 05/02/2012  . HYPERLIPIDEMIA 07/01/2007  . GERD 07/01/2007  . BACK PAIN 07/01/2007    Past Surgical History:  Procedure Laterality Date  . PARTIAL HYSTERECTOMY       OB History   None      Home Medications    Prior to Admission medications   Medication Sig Start Date End Date Taking? Authorizing Provider  albuterol (PROVENTIL HFA;VENTOLIN HFA) 108 (90 Base) MCG/ACT inhaler Inhale 2 puffs into the lungs every 4 (four) hours as needed for wheezing or shortness of  breath (cough, shortness of breath or wheezing.). 08/10/17   Elvina SidleLauenstein, Kurt, MD  aspirin EC 81 MG EC tablet Take 1 tablet (81 mg total) by mouth daily. 05/03/12   Kristie CowmanSchooler, Karen, MD  HYDROcodone-homatropine (HYDROMET) 5-1.5 MG/5ML syrup Take 5 mLs by mouth every 6 (six) hours as needed for cough. 08/10/17   Elvina SidleLauenstein, Kurt, MD  metoprolol tartrate (LOPRESSOR) 25 MG tablet Take 0.5 tablets (12.5 mg total) by mouth 2 (two) times daily. 08/10/17   Elvina SidleLauenstein, Kurt, MD  predniSONE (DELTASONE) 20 MG tablet Two daily with food 08/10/17   Elvina SidleLauenstein, Kurt, MD    Family History Family History  Problem Relation Age of Onset  . Cancer Father        colon and later "throat;" in remission    Social History Social History   Tobacco Use  . Smoking status: Never Smoker  . Smokeless tobacco: Never Used  Substance Use Topics  . Alcohol use: Yes    Alcohol/week: 1.2 oz    Types: 2 Glasses of wine per week    Comment: occassional  . Drug use: No     Allergies   Acetaminophen and Percocet [oxycodone-acetaminophen]   Review of Systems Review of Systems  Musculoskeletal:       Leg pain, R shoulder pain   All other systems reviewed and are negative.    Physical Exam Updated Vital Signs  BP 140/75 (BP Location: Left Arm)   Pulse 72   Temp 98.5 F (36.9 C) (Oral)   Resp 18   Ht 5' 4.5" (1.638 m)   Wt 127 kg (280 lb)   SpO2 96%   BMI 47.32 kg/m   Physical Exam  Constitutional:  Uncomfortable   HENT:  Head: Normocephalic.  Mouth/Throat: Oropharynx is clear and moist.  Eyes: Pupils are equal, round, and reactive to light. Conjunctivae and EOM are normal.  Neck: Normal range of motion. Neck supple.  Cardiovascular: Normal rate, regular rhythm and normal heart sounds.  Pulmonary/Chest: Effort normal and breath sounds normal. No stridor. No respiratory distress. She has no wheezes.  Abdominal: Soft. Bowel sounds are normal. She exhibits no distension. There is no tenderness. There is  no guarding.  Musculoskeletal:  Dec ROM R shoulder, some deltoid tenderness. No obvious deformity. 2 + pulses bilateral upper and lower extremities. Extremities not cold   Neurological: She is alert.  Skin: Skin is warm.  Psychiatric: She has a normal mood and affect.  Nursing note and vitals reviewed.    ED Treatments / Results  Labs (all labs ordered are listed, but only abnormal results are displayed) Labs Reviewed  CBC - Abnormal; Notable for the following components:      Result Value   WBC 11.2 (*)    RBC 5.35 (*)    RDW 15.7 (*)    All other components within normal limits  BASIC METABOLIC PANEL  CK  I-STAT TROPONIN, ED  I-STAT BETA HCG BLOOD, ED ([facility], [facility], [facility] ONLY)  I-STAT TROPONIN, ED    EKG EKG Interpretation  Date/Time:  Tuesday December 01 2017 14:51:46 EDT Ventricular Rate:  65 PR Interval:  140 QRS Duration: 101 QT Interval:  373 QTC Calculation: 388 R Axis:   60 Text Interpretation:  Sinus arrhythmia Consider left atrial enlargement Abnormal R-wave progression, early transition Borderline repolarization abnormality No significant change since last tracing Confirmed by Richardean Canal [phone]) on 12/01/2017 2:59:20 PM   Radiology Dg Chest 2 View  Result Date: 12/01/2017 CLINICAL DATA:  Leg pain x1 month EXAM: CHEST - 2 VIEW COMPARISON:  06/15/2015 FINDINGS: Lungs are clear.  No pleural effusion or pneumothorax. The heart is top-normal in size. Degenerative changes of the visualized thoracolumbar spine. IMPRESSION: Normal chest radiographs. Electronically Signed   By: Charline Bills M.D.   On: 12/01/2017 10:41   Dg Cervical Spine Complete  Result Date: 12/01/2017 CLINICAL DATA:  Neck pain common no known injury, initial encounter EXAM: CERVICAL SPINE - COMPLETE 4+ VIEW COMPARISON:  06/27/2012 MRI FINDINGS: Seven cervical segments are well visualized. Vertebral body height is well maintained. Anterior osteophytic changes are noted most prominent at C5-6. Mild facet  hypertrophic changes are noted. No significant neural foraminal changes are seen. The odontoid is within normal limits. No soft tissue abnormality is seen. IMPRESSION: Mild degenerative change without acute abnormality. Electronically Signed   By: Alcide Clever M.D.   On: 12/01/2017 14:14   Dg Shoulder Right  Result Date: 12/01/2017 CLINICAL DATA:  Right shoulder pain since 11/29/2017. No known injury. EXAM: RIGHT SHOULDER - 2+ VIEW COMPARISON:  None. FINDINGS: No acute bony or joint abnormality is identified. Multiple calcifications over the greater tuberosity are most consistent with calcific rotator cuff tendinopathy. Moderate acromioclavicular and mild glenohumeral osteoarthritis is identified. IMPRESSION: No acute abnormality. Calcific rotator cuff tendinopathy. Moderate acromioclavicular and mild glenohumeral osteoarthritis. Electronically Signed   By: Drusilla Kanner M.D.   On: 12/01/2017  14:17    Procedures Procedures (including critical care time)  Medications Ordered in ED Medications  morphine 4 MG/ML injection 4 mg (4 mg Intravenous Given 12/01/17 1434)  diazepam (VALIUM) injection 2.5 mg (2.5 mg Intravenous Given 12/01/17 1434)     Initial Impression / Assessment and Plan / ED Course  I have reviewed the triage vital signs and the nursing notes.  Pertinent labs & imaging results that were available during my care of the patient were reviewed by me and considered in my medical decision making (see chart for details).     Michele Gross is a 62 y.o. female here with R shoulder pain, bilateral leg pain. Leg pain for weeks, good pulses. R shoulder pain this morning, likely rotator cuff injury vs shoulder strain. Will get labs, xrays.   3:46 PM Xray showed R rotator cuff teniopathy and arthritis. I wonder if she has strained her rotator cuff or has pain from arthritis. Pain improved with pain meds. Delta trop neg. Will give shoulder immobilizer, ortho follow up, pain meds.    Final Clinical Impressions(s) / ED Diagnoses   Final diagnoses:  None    ED Discharge Orders    None       Charlynne Pander, MD 12/01/17 1547

## 2017-12-01 NOTE — ED Notes (Addendum)
Called to get vitals, pt went out to her car.

## 2017-12-01 NOTE — ED Notes (Signed)
Pt located in lobby. Obtaining repeat vital signs now.

## 2018-01-15 ENCOUNTER — Emergency Department (HOSPITAL_COMMUNITY)
Admission: EM | Admit: 2018-01-15 | Discharge: 2018-01-16 | Disposition: A | Discharge status: Home / Self Care | Payer: Medicaid Other | Attending: Emergency Medicine | Admitting: Emergency Medicine

## 2018-01-15 ENCOUNTER — Encounter (HOSPITAL_COMMUNITY): Payer: Self-pay | Admitting: Emergency Medicine

## 2018-01-15 ENCOUNTER — Emergency Department (HOSPITAL_COMMUNITY): Payer: Medicaid Other

## 2018-01-15 ENCOUNTER — Other Ambulatory Visit: Payer: Self-pay

## 2018-01-15 DIAGNOSIS — I10 Essential (primary) hypertension: Secondary | ICD-10-CM | POA: Insufficient documentation

## 2018-01-15 DIAGNOSIS — Z7982 Long term (current) use of aspirin: Secondary | ICD-10-CM | POA: Diagnosis not present

## 2018-01-15 DIAGNOSIS — Z79899 Other long term (current) drug therapy: Secondary | ICD-10-CM | POA: Insufficient documentation

## 2018-01-15 DIAGNOSIS — M25511 Pain in right shoulder: Secondary | ICD-10-CM | POA: Insufficient documentation

## 2018-01-15 LAB — CBC
HCT: 41.9 % (ref 36.0–46.0)
Hemoglobin: 12.8 g/dL (ref 12.0–15.0)
MCH: 26.2 pg (ref 26.0–34.0)
MCHC: 30.5 g/dL (ref 30.0–36.0)
MCV: 85.7 fL (ref 78.0–100.0)
PLATELETS: 277 10*3/uL (ref 150–400)
RBC: 4.89 MIL/uL (ref 3.87–5.11)
RDW: 15.5 % (ref 11.5–15.5)
WBC: 10.4 10*3/uL (ref 4.0–10.5)

## 2018-01-15 LAB — BASIC METABOLIC PANEL
Anion gap: 6 (ref 5–15)
BUN: 11 mg/dL (ref 6–20)
CALCIUM: 9.1 mg/dL (ref 8.9–10.3)
CO2: 25 mmol/L (ref 22–32)
CREATININE: 0.54 mg/dL (ref 0.44–1.00)
Chloride: 108 mmol/L (ref 98–111)
GFR calc non Af Amer: >60 mL/min (ref >60)
GLUCOSE: 102 mg/dL — AB (ref 70–99)
Potassium: 3.5 mmol/L (ref 3.5–5.1)
Sodium: 139 mmol/L (ref 135–145)

## 2018-01-15 LAB — I-STAT TROPONIN, ED: TROPONIN I, POC: 0 ng/mL (ref 0.00–0.08)

## 2018-01-15 MED ORDER — TRAMADOL HCL 50 MG PO TABS
50.0000 mg | ORAL_TABLET | Freq: Once | ORAL | Status: AC | PRN
  Administered 2018-01-16: 50 mg via ORAL
  Filled 2018-01-15: qty 1

## 2018-01-15 NOTE — ED Provider Notes (Signed)
Patient placed in Quick Look pathway, seen and evaluated   Chief Complaint: Right shoulder pain, chest pain, shortness of breath, leg swelling  HPI: Patient with history of hypertension presents with acute onset of right shoulder pain that woke her from sleep this morning.  With this she had pain that spread across her chest and shortness of breath.  She had pain in her shoulder like this in the past that she was told was her rotator cuff.  She denies fever or cough.  No nausea or vomiting.  No diaphoresis.  No high cholesterol, diabetes, smoking, family history of coronary artery disease.  ROS:  Positive ROS: (+) Chest pain, shortness of breath, leg swelling Negative ROS: (-) Diaphoresis, vomiting, cough  Physical Exam:   Gen: No distress  Neuro: Awake and Alert  Skin: Warm    Focused Exam: Heart RRR, nml S1,S2, no m/r/g; Lungs CTAB; Abd soft, NT, no rebound or guarding; Ext patients with decreased range of motion in her right shoulder with exquisite tenderness to palpation anteriorly and laterally, 2+ radial pulse, 2+ pedal pulses bilaterally, trace lower extremity edema symmetric bilaterally.  BP 131/75 (BP Location: Right Arm)   Pulse 64   Temp 98.6 F (37 C) (Oral)   Resp 18   Ht 5' 3.5" (1.613 m)   Wt 122.5 kg (270 lb)   SpO2 99%   BMI 47.08 kg/m   Plan: Cardiac work-up ordered given age and risk factors, however this may be related to patient's shoulder pain which is reproducible.  Initiation of care has begun. The patient has been counseled on the process, plan, and necessity for staying for the completion/evaluation, and the remainder of the medical screening examination    Renne CriglerGeiple, Victoriya Pol, Cordelia Poche-C 01/15/18 Nida [id number]    Cathren LaineSteinl, Kevin, MD 01/15/18 2227

## 2018-01-15 NOTE — ED Triage Notes (Signed)
Pt reports sudden onset of arm pain that woke her up from her sleep. She reports that she was seen and told she had a torn rotator cuff. Pt also reporting chest pain, trouble sleeping, leg swelling, and SOB.

## 2018-01-16 MED ORDER — IBUPROFEN 600 MG PO TABS: 600.0000 mg | ORAL_TABLET | Freq: Four times a day (QID) | ORAL | 0 refills | Status: DC | PRN

## 2018-01-16 MED ORDER — OXYCODONE-ACETAMINOPHEN 5-325 MG PO TABS
1.0000 | ORAL_TABLET | Freq: Once | ORAL | Status: AC
  Administered 2018-01-16: 1 via ORAL
  Filled 2018-01-16: qty 1

## 2018-01-16 MED ORDER — OXYCODONE-ACETAMINOPHEN 5-325 MG PO TABS: 1.0000 | ORAL_TABLET | ORAL | 0 refills | Status: DC | PRN

## 2018-01-16 NOTE — ED Provider Notes (Signed)
[facility] EMERGENCY DEPARTMENT Provider Note   CSN: [id number] Arrival date & time: 01/15/18  1803     History   Chief Complaint Chief Complaint  Patient presents with  . Chest Pain  . Arm Pain    HPI Michele Gross is a 62 y.o. female.  Patient presents with complaint of severe right shoulder pain. She states she has a known history of rotator cuff problem in that shoulder with the same pain but never this intense. She has not seen orthopedics in the past. No known recent injury. No UE weakness or numbness. No neck pain. The pain radiates into right chest and down the arm. No SOB, nausea, diaphoresis.  The history is provided by the patient. No language interpreter was used.    Past Medical History:  Diagnosis Date  . Chest pain    Negative nuclear stress 2007, negative stress echo 2011, reported negative cath 2000; negative stress echo November 2013  . Chronic shoulder pain   . DJD (degenerative joint disease)   . GERD (gastroesophageal reflux disease)   . Hypertension   . Obesity   . Sleep apnea     Patient Active Problem List   Diagnosis Date Noted  . Cervical radiculopathy due to degenerative joint disease of spine 05/02/2012  . Chest pain, atypical 05/02/2012  . HYPERLIPIDEMIA 07/01/2007  . GERD 07/01/2007  . BACK PAIN 07/01/2007    Past Surgical History:  Procedure Laterality Date  . PARTIAL HYSTERECTOMY       OB History   None      Home Medications    Prior to Admission medications   Medication Sig Start Date End Date Taking? Authorizing Provider  albuterol (PROVENTIL HFA;VENTOLIN HFA) 108 (90 Base) MCG/ACT inhaler Inhale 2 puffs into the lungs every 4 (four) hours as needed for wheezing or shortness of breath (cough, shortness of breath or wheezing.). 08/10/17   Elvina Sidle, MD  aspirin EC 81 MG EC tablet Take 1 tablet (81 mg total) by mouth daily. 05/03/12   Kristie Cowman, MD  HYDROcodone-acetaminophen  (NORCO/VICODIN) 5-325 MG tablet Take 1 tablet by mouth every 6 (six) hours as needed. 12/01/17   Charlynne Pander, MD  HYDROcodone-homatropine (HYDROMET) 5-1.5 MG/5ML syrup Take 5 mLs by mouth every 6 (six) hours as needed for cough. 08/10/17   Elvina Sidle, MD  ibuprofen (ADVIL,MOTRIN) 600 MG tablet Take 1 tablet (600 mg total) by mouth every 6 (six) hours as needed. 12/01/17   Charlynne Pander, MD  metoprolol tartrate (LOPRESSOR) 25 MG tablet Take 0.5 tablets (12.5 mg total) by mouth 2 (two) times daily. 08/10/17   Elvina Sidle, MD  predniSONE (DELTASONE) 20 MG tablet Take 60 mg daily x 2 days then 40 mg daily x 2 days then 20 mg daily x 2 days 12/01/17   Charlynne Pander, MD    Family History Family History  Problem Relation Age of Onset  . Cancer Father        colon and later "throat;" in remission    Social History Social History   Tobacco Use  . Smoking status: Never Smoker  . Smokeless tobacco: Never Used  Substance Use Topics  . Alcohol use: Yes    Alcohol/week: 1.2 oz    Types: 2 Glasses of wine per week    Comment: occassional  . Drug use: No     Allergies   Acetaminophen and Percocet [oxycodone-acetaminophen]   Review of Systems Review of Systems  Constitutional: Negative  for chills and fever.  Respiratory: Negative.   Cardiovascular: Negative.   Gastrointestinal: Negative.   Musculoskeletal:       See HPI.  Skin: Negative.   Neurological: Negative.  Negative for weakness and numbness.     Physical Exam Updated Vital Signs BP 119/65   Pulse 64   Temp 98.6 F (37 C) (Oral)   Resp 15   Ht 5' 3.5" (1.613 m)   Wt 122.5 kg (270 lb)   SpO2 100%   BMI 47.08 kg/m   Physical Exam  Constitutional: She is oriented to person, place, and time. She appears well-developed and well-nourished.  Neck: Normal range of motion.  Pulmonary/Chest: Effort normal.  Musculoskeletal:  Right shoulder tender to [facility] and deltoid without swelling or deformity. ROM  limited by pain, 5/5 grip strength. Distal pulses equal and symmetric in UE's.   Neurological: She is alert and oriented to person, place, and time. No sensory deficit.  Skin: Skin is warm and dry.     ED Treatments / Results  Labs (all labs ordered are listed, but only abnormal results are displayed) Labs Reviewed  BASIC METABOLIC PANEL - Abnormal; Notable for the following components:      Result Value   Glucose, Bld 102 (*)    All other components within normal limits  CBC  I-STAT TROPONIN, ED    EKG EKG Interpretation  Date/Time:  Friday January 15 2018 18:13:32 EDT Ventricular Rate:  64 PR Interval:  148 QRS Duration: 88 QT Interval:  400 QTC Calculation: 412 R Axis:   46 Text Interpretation:  Normal sinus rhythm Nonspecific T wave abnormality Abnormal ECG When compared with ECG of 12/01/2017, No significant change was found Confirmed by Dione BoozeGlick, David ([id number]) on 01/15/2018 11:03:40 PM   Radiology Dg Chest 2 View  Result Date: 01/15/2018 CLINICAL DATA:  Chest pain. EXAM: CHEST - 2 VIEW COMPARISON:  Radiographs of December 01, 2017. FINDINGS: Stable cardiomegaly. No pneumothorax or pleural effusion is noted. Both lungs are clear. The visualized skeletal structures are unremarkable. IMPRESSION: No active cardiopulmonary disease. Electronically Signed   By: Lupita RaiderJames  Green Jr, M.D.   On: 01/15/2018 19:22   Dg Shoulder Right  Result Date: 01/15/2018 CLINICAL DATA:  Acute right shoulder pain. EXAM: RIGHT SHOULDER - 2+ VIEW COMPARISON:  Radiographs of December 01, 2017. FINDINGS: There is no evidence of fracture or dislocation. Calcified rotator cuff tendinopathy is again noted overlying greater tuberosity. Mild degenerative changes are seen involving the right acromioclavicular joint and glenohumeral joints. Soft tissues are unremarkable. IMPRESSION: Mild degenerative joint disease of the right acromioclavicular and glenohumeral joints. Calcific rotator cuff tendinopathy is again noted. No acute  abnormality seen in the right shoulder. Electronically Signed   By: Lupita RaiderJames  Green Jr, M.D.   On: 01/15/2018 19:20    Procedures Procedures (including critical care time)  Medications Ordered in ED Medications  oxyCODONE-acetaminophen (PERCOCET/ROXICET) 5-325 MG per tablet 1 tablet (has no administration in time range)  traMADol (ULTRAM) tablet 50 mg (50 mg Oral Given 01/16/18 0001)     Initial Impression / Assessment and Plan / ED Course  I have reviewed the triage vital signs and the nursing notes.  Pertinent labs & imaging results that were available during my care of the patient were reviewed by me and considered in my medical decision making (see chart for details).     Patient with a history of painful rotator cuff in right shoulder here with worsening pain without injury. Pain radiates to chest.  Cardiac workup initiated and is negative. Exam and history supports right shoulder musculoskeletal pain. Will provide splint for comfort, Percocet for home (she has Ultram and this does not give relief), ibuprofen and provide orthopedic referral.   Final Clinical Impressions(s) / ED Diagnoses   Final diagnoses:  None   1. Right shoulder pain  ED Discharge Orders    None       Elpidio Anis, Cordelia Poche 01/16/18 0032    Dione Booze, MD 01/16/18 [phone]

## 2018-01-27 NOTE — Progress Notes (Signed)
Patient ID: Michele Gross, female   DOB: July 31, 1955, 62 y.o.   MRN: [id number]   Michele Gross, is a 62 y.o. female  [id number]  [id number]  DOB - July 31, 1955  Subjective:  Chief Complaint and HPI: Michele Gross is a 62 y.o. female here today to establish care and for a follow up visit Seen in ED 01/15/2018 for acute R shoulder pain.  She continues to have R shoulder pain that is severe.  NKI-she just woke up with it this way before she went to the ED.  Ibuprofen not helping with pain.  Percocet helped but they only gave her 6.    R shoulder Xray: Mild degenerative joint disease of the right acromioclavicular and glenohumeral joints. Calcific rotator cuff tendinopathy is again noted. No acute abnormality seen in the right shoulder.  From ED note: Patient presents with complaint of severe right shoulder pain. She states she has a known history of rotator cuff problem in that shoulder with the same pain but never this intense. She has not seen orthopedics in the past. No known recent injury. No UE weakness or numbness. No neck pain. The pain radiates into right chest and down the arm. No SOB, nausea, diaphoresis.  From A/P: Patient with a history of painful rotator cuff in right shoulder here with worsening pain without injury. Pain radiates to chest.  Cardiac workup initiated and is negative. Exam and history supports right shoulder musculoskeletal pain. Will provide splint for comfort, Percocet for home (she has Ultram and this does not give relief), ibuprofen and provide orthopedic referral  ED/Hospital notes/labs reviewed and summarized above     ROS:   Constitutional:  No f/c, No night sweats, No unexplained weight loss. EENT:  No vision changes, No blurry vision, No hearing changes. No mouth, throat, or ear problems.  Respiratory: No cough, No SOB Cardiac: No CP, no palpitations GI:  No abd pain, No N/V/D. GU: No Urinary s/sx Musculoskeletal: +R shoulder  pain Neuro: No headache, no dizziness, no motor weakness.  Skin: No rash Endocrine:  No polydipsia. No polyuria.  Psych: Denies SI/HI  No problems updated.  ALLERGIES: Allergies  Allergen Reactions  . Acetaminophen Other (See Comments)    Pt told not to take with blood pressure medicine  . Percocet [Oxycodone-Acetaminophen] Other (See Comments)    hallucinations    PAST MEDICAL HISTORY: Past Medical History:  Diagnosis Date  . Chest pain    Negative nuclear stress 2007, negative stress echo 2011, reported negative cath 2000; negative stress echo November 2013  . Chronic shoulder pain   . DJD (degenerative joint disease)   . GERD (gastroesophageal reflux disease)   . Hypertension   . Obesity   . Sleep apnea     MEDICATIONS AT HOME: Prior to Admission medications   Medication Sig Start Date End Date Taking? Authorizing Provider  albuterol (PROVENTIL HFA;VENTOLIN HFA) 108 (90 Base) MCG/ACT inhaler Inhale 2 puffs into the lungs every 4 (four) hours as needed for wheezing or shortness of breath (cough, shortness of breath or wheezing.). 08/10/17  Yes Elvina SidleLauenstein, Kurt, MD  aspirin EC 81 MG EC tablet Take 1 tablet (81 mg total) by mouth daily. 05/03/12  Yes Kristie CowmanSchooler, Karen, MD  metoprolol tartrate (LOPRESSOR) 25 MG tablet Take 0.5 tablets (12.5 mg total) by mouth 2 (two) times daily. 08/10/17  Yes Elvina SidleLauenstein, Kurt, MD  acetaminophen-codeine (TYLENOL #3) 300-30 MG tablet Take 1 tablet by mouth every 4 (four) hours as needed for moderate pain. 01/28/18  Georgian Co M, PA-C  methocarbamol (ROBAXIN) 500 MG tablet Take 1 tablet (500 mg total) by mouth every 6 (six) hours as needed for muscle spasms. 01/28/18   Anders Simmonds, PA-C  naproxen (NAPROSYN) 500 MG tablet Take 1 tablet (500 mg total) by mouth 2 (two) times daily with a meal. 01/28/18   Anders Simmonds, PA-C     Objective:  EXAM:   Vitals:   01/28/18 1054  BP: 131/79  Pulse: 66  Temp: 98.7 F (37.1 C)  TempSrc: Oral   SpO2: 93%  Weight: 275 lb 9.6 oz (125 kg)  Height: 5\' 4"  (1.626 m)    General appearance : A&OX3. NAD. Non-toxic-appearing HEENT: Atraumatic and Normocephalic.  PERRLA. EOM intact.   Neck: supple, no JVD. No cervical lymphadenopathy. No thyromegaly Chest/Lungs:  Breathing-non-labored, Good air entry bilaterally, breath sounds normal without rales, rhonchi, or wheezing  CVS: S1 S2 regular, no murmurs, gallops, rubs  Extremities: no UE exam done due to extreme pain.  UE pulses=B.  No swelling or effusion noted.  No sign of infection.   Neurology:  CN II-XII grossly intact, Non focal.   Psych:  TP linear. J/I WNL. Normal speech. Appropriate eye contact and affect.  Skin:  No Rash  Data Review Lab Results  Component Value Date   HGBA1C 5.8 (H) 05/02/2012   HGBA1C  01/16/2007    5.9 (NOTE)   The ADA recommends the following therapeutic goals for glycemic   control related to Hgb A1C measurement:   Goal of Therapy:   < 7.0% Hgb A1C   Action Suggested:  > 8.0% Hgb A1C   Ref:  Diabetes Care, 22, Suppl. 1, 1999     Assessment & Plan   1. Acute pain of right shoulder - Ambulatory referral to Orthopedic Surgery - naproxen (NAPROSYN) 500 MG tablet; Take 1 tablet (500 mg total) by mouth 2 (two) times daily with a meal.  Dispense: 60 tablet; Refill: 1 - methocarbamol (ROBAXIN) 500 MG tablet; Take 1 tablet (500 mg total) by mouth every 6 (six) hours as needed for muscle spasms.  Dispense: 90 tablet; Refill: 0 - acetaminophen-codeine (TYLENOL #3) 300-30 MG tablet; Take 1 tablet by mouth every 4 (four) hours as needed for moderate pain.  Dispense: 15 tablet; Refill: 0  2. Abnormal x-ray R shoulder film-Mild degenerative joint disease of the right acromioclavicular and glenohumeral joints. Calcific rotator cuff tendinopathy is again noted. No acute abnormality seen in the right shoulder. - Ambulatory referral to Orthopedic Surgery  3. Encounter for examination following treatment at  hospital No improvement     Patient have been counseled extensively about nutrition and exercise  Return in about 6 weeks (around 03/11/2018) for assign PCP.  The patient was given clear instructions to go to ER or return to medical center if symptoms don't improve, worsen or new problems develop. The patient verbalized understanding. The patient was told to call to get lab results if they haven't heard anything in the next week.     Georgian Co, PA-C [facility] and [facility] [place], [place] [phone]   01/28/2018, 11:15 AM

## 2018-01-28 ENCOUNTER — Ambulatory Visit: Payer: Medicaid Other | Attending: Family Medicine | Admitting: Physician Assistant

## 2018-01-28 VITALS — BP 131/79 | HR 66 | Temp 98.7°F | Ht 64.0 in | Wt 275.6 lb

## 2018-01-28 DIAGNOSIS — K219 Gastro-esophageal reflux disease without esophagitis: Secondary | ICD-10-CM | POA: Insufficient documentation

## 2018-01-28 DIAGNOSIS — M19011 Primary osteoarthritis, right shoulder: Secondary | ICD-10-CM | POA: Diagnosis not present

## 2018-01-28 DIAGNOSIS — Z09 Encounter for follow-up examination after completed treatment for conditions other than malignant neoplasm: Secondary | ICD-10-CM

## 2018-01-28 DIAGNOSIS — I10 Essential (primary) hypertension: Secondary | ICD-10-CM | POA: Diagnosis not present

## 2018-01-28 DIAGNOSIS — E669 Obesity, unspecified: Secondary | ICD-10-CM | POA: Insufficient documentation

## 2018-01-28 DIAGNOSIS — Z7982 Long term (current) use of aspirin: Secondary | ICD-10-CM | POA: Diagnosis not present

## 2018-01-28 DIAGNOSIS — M25511 Pain in right shoulder: Secondary | ICD-10-CM | POA: Insufficient documentation

## 2018-01-28 DIAGNOSIS — Z79899 Other long term (current) drug therapy: Secondary | ICD-10-CM | POA: Insufficient documentation

## 2018-01-28 DIAGNOSIS — G4730 Sleep apnea, unspecified: Secondary | ICD-10-CM | POA: Diagnosis not present

## 2018-01-28 DIAGNOSIS — R9389 Abnormal findings on diagnostic imaging of other specified body structures: Secondary | ICD-10-CM

## 2018-01-28 MED ORDER — ACETAMINOPHEN-CODEINE #3 300-30 MG PO TABS: 1.0000 | ORAL_TABLET | ORAL | 0 refills | Status: DC | PRN

## 2018-01-28 MED ORDER — METHOCARBAMOL 500 MG PO TABS: 500.0000 mg | ORAL_TABLET | Freq: Four times a day (QID) | ORAL | 0 refills | Status: DC | PRN

## 2018-01-28 MED ORDER — NAPROXEN 500 MG PO TABS: 500.0000 mg | ORAL_TABLET | Freq: Two times a day (BID) | ORAL | 1 refills | Status: DC

## 2018-01-28 MED FILL — NAPROXEN 500 MG TABLET: 500 | 30 days supply | Qty: 60 | Fill #0

## 2018-01-28 MED FILL — ACETAMINOPHEN/COD #3 TABLET: 300-30 | 3 days supply | Qty: 15 | Fill #0

## 2018-01-28 MED FILL — METHOCARBAMOL 500 MG TABS: 500 | 22 days supply | Qty: 90 | Fill #0

## 2018-01-28 NOTE — Patient Instructions (Signed)
Shoulder Pain Many things can cause shoulder pain, including:  An injury.  Moving the arm in the same way again and again (overuse).  Joint pain (arthritis).  Follow these instructions at home: Take these actions to help with your pain:  Squeeze a soft ball or a foam pad as much as you can. This helps to prevent swelling. It also makes the arm stronger.  Take over-the-counter and prescription medicines only as told by your doctor.  If told, put ice on the area: ? Put ice in a plastic bag. ? Place a towel between your skin and the bag. ? Leave the ice on for 20 minutes, 2-3 times per day. Stop putting on ice if it does not help with the pain.  If you were given a shoulder sling or immobilizer: ? Wear it as told. ? Remove it to shower or bathe. ? Move your arm as little as possible. ? Keep your hand moving. This helps prevent swelling.  Contact a doctor if:  Your pain gets worse.  Medicine does not help your pain.  You have new pain in your arm, hand, or fingers. Get help right away if:  Your arm, hand, or fingers: ? Tingle. ? Are numb. ? Are swollen. ? Are painful. ? Turn white or blue. This information is not intended to replace advice given to you by your health care provider. Make sure you discuss any questions you have with your health care provider. Document Released: 11/26/2007 Document Revised: 02/03/2016 Document Reviewed: 10/02/2014 Elsevier Interactive Patient Education  2018 Elsevier Inc.  

## 2018-08-13 ENCOUNTER — Ambulatory Visit (HOSPITAL_COMMUNITY)
Admission: EM | Admit: 2018-08-13 | Discharge: 2018-08-13 | Disposition: A | Discharge status: Home / Self Care | Payer: Self-pay | Attending: Family Medicine | Admitting: Family Medicine

## 2018-08-13 ENCOUNTER — Ambulatory Visit (INDEPENDENT_AMBULATORY_CARE_PROVIDER_SITE_OTHER): Payer: Self-pay

## 2018-08-13 ENCOUNTER — Encounter (HOSPITAL_COMMUNITY): Payer: Self-pay | Admitting: Emergency Medicine

## 2018-08-13 DIAGNOSIS — J22 Unspecified acute lower respiratory infection: Secondary | ICD-10-CM

## 2018-08-13 DIAGNOSIS — M7918 Myalgia, other site: Secondary | ICD-10-CM

## 2018-08-13 DIAGNOSIS — R0789 Other chest pain: Secondary | ICD-10-CM

## 2018-08-13 DIAGNOSIS — R05 Cough: Secondary | ICD-10-CM

## 2018-08-13 DIAGNOSIS — R062 Wheezing: Secondary | ICD-10-CM

## 2018-08-13 DIAGNOSIS — R079 Chest pain, unspecified: Secondary | ICD-10-CM

## 2018-08-13 DIAGNOSIS — R5383 Other fatigue: Secondary | ICD-10-CM

## 2018-08-13 DIAGNOSIS — R6883 Chills (without fever): Secondary | ICD-10-CM

## 2018-08-13 MED ORDER — BENZONATATE 200 MG PO CAPS: 200.0000 mg | ORAL_CAPSULE | Freq: Two times a day (BID) | ORAL | 0 refills | Status: DC | PRN

## 2018-08-13 MED ORDER — IPRATROPIUM-ALBUTEROL 0.5-2.5 (3) MG/3ML IN SOLN
RESPIRATORY_TRACT | Status: AC
  Filled 2018-08-13: qty 3

## 2018-08-13 MED ORDER — IPRATROPIUM-ALBUTEROL 0.5-2.5 (3) MG/3ML IN SOLN
3.0000 mL | Freq: Once | RESPIRATORY_TRACT | Status: AC
  Administered 2018-08-13: 3 mL via RESPIRATORY_TRACT

## 2018-08-13 MED ORDER — PREDNISONE 20 MG PO TABS: 20.0000 mg | ORAL_TABLET | Freq: Two times a day (BID) | ORAL | 0 refills | Status: DC

## 2018-08-13 MED ORDER — AMOXICILLIN-POT CLAVULANATE 875-125 MG PO TABS: 1.0000 | ORAL_TABLET | Freq: Two times a day (BID) | ORAL | 0 refills | Status: DC

## 2018-08-13 NOTE — ED Provider Notes (Signed)
[facility]    CSN: [id number] Arrival date & time: 08/13/18  1301     History   Chief Complaint Chief Complaint  Patient presents with  . Cough    HPI Michele Gross is a 63 y.o. female.   HPI  Patient is here for an upper respiratory infection.  Cough and congestion.  She states she coughs until she vomits.  She is been very tired.  She is had some chest and body aches.  Chest pain was deep breath.  No runny or stuffy nose.  No nausea or vomiting.  She has had some chills but has not taken her temperature.  Unknown exposure to influenza.  She states she is been sick for a week, does not feel like she is getting better.  Past Medical History:  Diagnosis Date  . Chest pain    Negative nuclear stress 2007, negative stress echo 2011, reported negative cath 2000; negative stress echo November 2013  . Chronic shoulder pain   . DJD (degenerative joint disease)   . GERD (gastroesophageal reflux disease)   . Hypertension   . Obesity   . Sleep apnea     Patient Active Problem List   Diagnosis Date Noted  . Cervical radiculopathy due to degenerative joint disease of spine 05/02/2012  . Chest pain, atypical 05/02/2012  . HYPERLIPIDEMIA 07/01/2007  . GERD 07/01/2007  . BACK PAIN 07/01/2007    Past Surgical History:  Procedure Laterality Date  . PARTIAL HYSTERECTOMY      OB History   No obstetric history on file.      Home Medications    Prior to Admission medications   Medication Sig Start Date End Date Taking? Authorizing Provider  aspirin EC 81 MG EC tablet Take 1 tablet (81 mg total) by mouth daily. 05/03/12  Yes Kristie Cowman, MD  metoprolol tartrate (LOPRESSOR) 25 MG tablet Take 0.5 tablets (12.5 mg total) by mouth 2 (two) times daily. 08/10/17  Yes Elvina Sidle, MD  acetaminophen-codeine (TYLENOL #3) 300-30 MG tablet Take 1 tablet by mouth every 4 (four) hours as needed for moderate pain. 01/28/18   Anders Simmonds, PA-C  albuterol (PROVENTIL  HFA;VENTOLIN HFA) 108 (90 Base) MCG/ACT inhaler Inhale 2 puffs into the lungs every 4 (four) hours as needed for wheezing or shortness of breath (cough, shortness of breath or wheezing.). 08/10/17   Elvina Sidle, MD  amoxicillin-clavulanate (AUGMENTIN) 875-125 MG tablet Take 1 tablet by mouth every 12 (twelve) hours. 08/13/18   Eustace Moore, MD  benzonatate (TESSALON) 200 MG capsule Take 1 capsule (200 mg total) by mouth 2 (two) times daily as needed for cough. 08/13/18   Eustace Moore, MD  methocarbamol (ROBAXIN) 500 MG tablet Take 1 tablet (500 mg total) by mouth every 6 (six) hours as needed for muscle spasms. 01/28/18   Anders Simmonds, PA-C  naproxen (NAPROSYN) 500 MG tablet Take 1 tablet (500 mg total) by mouth 2 (two) times daily with a meal. 01/28/18   McClung, Marzella Schlein, PA-C  predniSONE (DELTASONE) 20 MG tablet Take 1 tablet (20 mg total) by mouth 2 (two) times daily with a meal. 08/13/18   Eustace Moore, MD    Family History Family History  Problem Relation Age of Onset  . Cancer Father        colon and later "throat;" in remission    Social History Social History   Tobacco Use  . Smoking status: Never Smoker  . Smokeless tobacco:  Never Used  Substance Use Topics  . Alcohol use: Yes    Alcohol/week: 2.0 standard drinks    Types: 2 Glasses of wine per week    Comment: occassional  . Drug use: No     Allergies   Percocet [oxycodone-acetaminophen]   Review of Systems Review of Systems  Constitutional: Positive for activity change, chills and fatigue. Negative for fever.  HENT: Positive for congestion. Negative for ear pain and sore throat.   Eyes: Negative for pain and visual disturbance.  Respiratory: Positive for cough and shortness of breath.   Cardiovascular: Negative for chest pain and palpitations.  Gastrointestinal: Negative for abdominal pain and vomiting.  Genitourinary: Negative for dysuria and hematuria.  Musculoskeletal: Positive for  myalgias. Negative for arthralgias and back pain.  Skin: Negative for color change and rash.  Neurological: Negative for seizures and syncope.  Psychiatric/Behavioral: Positive for sleep disturbance.  All other systems reviewed and are negative.    Physical Exam Triage Vital Signs ED Triage Vitals  Enc Vitals Group     BP 08/13/18 1405 137/74     Pulse Rate 08/13/18 1405 83     Resp 08/13/18 1405 20     Temp 08/13/18 1405 98.3 F (36.8 C)     Temp Source 08/13/18 1405 Temporal     SpO2 08/13/18 1405 95 %     Weight --      Height --      Head Circumference --      Peak Flow --      Pain Score 08/13/18 1406 10     Pain Loc --      Pain Edu? --      Excl. in GC? --    No data found.  Updated Vital Signs BP 137/74 (BP Location: Right Arm)   Pulse 83   Temp 98.3 F (36.8 C) (Temporal)   Resp 20   SpO2 95%        Physical Exam Constitutional:      General: She is not in acute distress.    Appearance: She is well-developed. She is obese. She is ill-appearing.     Comments: Patient appears tired.  HENT:     Head: Atraumatic.     Right Ear: Tympanic membrane and ear canal normal.     Left Ear: Tympanic membrane and ear canal normal.     Nose: Nose normal. No congestion.     Mouth/Throat:     Mouth: Mucous membranes are moist.  Eyes:     Conjunctiva/sclera: Conjunctivae normal.     Pupils: Pupils are equal, round, and reactive to light.  Neck:     Musculoskeletal: Normal range of motion.  Cardiovascular:     Rate and Rhythm: Normal rate and regular rhythm.     Heart sounds: Normal heart sounds.  Pulmonary:     Effort: Pulmonary effort is normal. No respiratory distress.     Breath sounds: Rhonchi present.  Abdominal:     General: Abdomen is flat. There is no distension.     Palpations: Abdomen is soft.     Tenderness: There is no abdominal tenderness.  Musculoskeletal: Normal range of motion.  Lymphadenopathy:     Cervical: Cervical adenopathy present.    Skin:    General: Skin is warm and dry.  Neurological:     General: No focal deficit present.     Mental Status: She is alert.  Psychiatric:        Mood and Affect: Mood  normal.        Behavior: Behavior normal.   She has few inspiratory wheeze, rhonchi centrally.  Harsh cough.   UC Treatments / Results  Labs (all labs ordered are listed, but only abnormal results are displayed) Labs Reviewed - No data to display  EKG None  Radiology Dg Chest 2 View  Result Date: 08/13/2018 CLINICAL DATA:  Cough, chest pain. EXAM: CHEST - 2 VIEW COMPARISON:  Radiographs of January 15, 2018. FINDINGS: Stable cardiomegaly. Both lungs are clear. No pneumothorax or pleural effusion is noted. The visualized skeletal structures are unremarkable. IMPRESSION: No active cardiopulmonary disease. Electronically Signed   By: Lupita Raider, M.D.   On: 08/13/2018 15:22    Procedures Procedures (including critical care time)  Medications Ordered in UC Medications  ipratropium-albuterol (DUONEB) 0.5-2.5 (3) MG/3ML nebulizer solution 3 mL (3 mLs Nebulization Given 08/13/18 1527)    Initial Impression / Assessment and Plan / UC Course  I have reviewed the triage vital signs and the nursing notes.  Pertinent labs & imaging results that were available during my care of the patient were reviewed by me and considered in my medical decision making (see chart for details).     Patient likely has a viral upper respiratory infection, with underlying disease and wheezing.  She fears that she is getting worse instead of better.  And when to cover her with an antibiotic in addition to the prednisone, cough suppression, with rest and fluids.  She will return if she is worse instead of better at any time Final Clinical Impressions(s) / UC Diagnoses   Final diagnoses:  LRTI (lower respiratory tract infection)     Discharge Instructions     You need to drink plenty of fluids Take Augmentin antibiotic 2 times a  day Take a probiotic while you are on Augmentin Take Tessalon 2 times a day for coughing Take prednisone 2 times a day for inflammation and wheezing Expect improvement over the next 2 to 3 days See your PCP if not better by next week   ED Prescriptions    Medication Sig Dispense Auth. Provider   benzonatate (TESSALON) 200 MG capsule Take 1 capsule (200 mg total) by mouth 2 (two) times daily as needed for cough. 20 capsule Eustace Moore, MD   amoxicillin-clavulanate (AUGMENTIN) 875-125 MG tablet Take 1 tablet by mouth every 12 (twelve) hours. 14 tablet Eustace Moore, MD   predniSONE (DELTASONE) 20 MG tablet Take 1 tablet (20 mg total) by mouth 2 (two) times daily with a meal. 10 tablet Eustace Moore, MD     Controlled Substance Prescriptions [place] Controlled Substance Registry consulted? No   Eustace Moore, MD 08/13/18 [phone]

## 2018-08-13 NOTE — Discharge Instructions (Signed)
You need to drink plenty of fluids Take Augmentin antibiotic 2 times a day Take a probiotic while you are on Augmentin Take Tessalon 2 times a day for coughing Take prednisone 2 times a day for inflammation and wheezing Expect improvement over the next 2 to 3 days See your PCP if not better by next week

## 2018-08-13 NOTE — ED Triage Notes (Signed)
Pt presents to [facility] for assessment of cough, congestion, emesis.  Denies known fevers.   Pt c/o chest pain with cough, central, no radiation.

## 2018-09-01 ENCOUNTER — Encounter (HOSPITAL_COMMUNITY): Payer: Self-pay | Admitting: Emergency Medicine

## 2018-09-01 ENCOUNTER — Other Ambulatory Visit: Payer: Self-pay

## 2018-09-01 ENCOUNTER — Ambulatory Visit (INDEPENDENT_AMBULATORY_CARE_PROVIDER_SITE_OTHER): Payer: Self-pay

## 2018-09-01 ENCOUNTER — Ambulatory Visit (HOSPITAL_COMMUNITY)
Admission: EM | Admit: 2018-09-01 | Discharge: 2018-09-01 | Disposition: A | Discharge status: Home / Self Care | Payer: Self-pay | Attending: Family Medicine | Admitting: Family Medicine

## 2018-09-01 DIAGNOSIS — R05 Cough: Secondary | ICD-10-CM

## 2018-09-01 DIAGNOSIS — J180 Bronchopneumonia, unspecified organism: Secondary | ICD-10-CM

## 2018-09-01 DIAGNOSIS — R0602 Shortness of breath: Secondary | ICD-10-CM

## 2018-09-01 MED ORDER — KETOROLAC TROMETHAMINE 60 MG/2ML IM SOLN
INTRAMUSCULAR | Status: AC
  Filled 2018-09-01: qty 2

## 2018-09-01 MED ORDER — ALBUTEROL SULFATE HFA 108 (90 BASE) MCG/ACT IN AERS
2.0000 | INHALATION_SPRAY | Freq: Once | RESPIRATORY_TRACT | Status: AC
  Administered 2018-09-01: 2 via RESPIRATORY_TRACT

## 2018-09-01 MED ORDER — ALBUTEROL SULFATE HFA 108 (90 BASE) MCG/ACT IN AERS
INHALATION_SPRAY | RESPIRATORY_TRACT | Status: AC
  Filled 2018-09-01: qty 6.7

## 2018-09-01 MED ORDER — IPRATROPIUM-ALBUTEROL 0.5-2.5 (3) MG/3ML IN SOLN
RESPIRATORY_TRACT | Status: AC
  Filled 2018-09-01: qty 3

## 2018-09-01 MED ORDER — AMOXICILLIN 500 MG PO CAPS: 500.0000 mg | ORAL_CAPSULE | Freq: Two times a day (BID) | ORAL | 0 refills | Status: AC

## 2018-09-01 MED ORDER — IPRATROPIUM-ALBUTEROL 0.5-2.5 (3) MG/3ML IN SOLN
3.0000 mL | Freq: Once | RESPIRATORY_TRACT | Status: AC
  Administered 2018-09-01: 3 mL via RESPIRATORY_TRACT

## 2018-09-01 MED ORDER — KETOROLAC TROMETHAMINE 60 MG/2ML IM SOLN
60.0000 mg | Freq: Once | INTRAMUSCULAR | Status: AC
  Administered 2018-09-01: 60 mg via INTRAMUSCULAR

## 2018-09-01 MED ORDER — AZITHROMYCIN 250 MG PO TABS: 250.0000 mg | ORAL_TABLET | Freq: Every day | ORAL | 0 refills | Status: DC

## 2018-09-01 NOTE — ED Provider Notes (Signed)
[facility]   [id number] 09/01/18 Arrival Time: 8828  Cc: COUGH  SUBJECTIVE:  Michele Gross is a 63 y.o. female who presents with persistent dry cough and SOB x 1 week.  Admits to positive sick exposure at work.  Describes cough as constant and dry.  Was seen a couple of weeks ago for similar symptoms and treated for lower respiratory infection with augmentin and prednisone.  Reports temporary relief with medications.  Symptoms are made worse at night.  Complains of associated HA.  Denies fever, chills, fatigue, sinus pain, rhinorrhea, sore throat, wheezing, chest pain, nausea, changes in bowel or bladder habits.    ROS: As per HPI.  Past Medical History:  Diagnosis Date  . Chest pain    Negative nuclear stress 2007, negative stress echo 2011, reported negative cath 2000; negative stress echo November 2013  . Chronic shoulder pain   . DJD (degenerative joint disease)   . GERD (gastroesophageal reflux disease)   . Hypertension   . Obesity   . Sleep apnea    Past Surgical History:  Procedure Laterality Date  . PARTIAL HYSTERECTOMY     Allergies  Allergen Reactions  . Percocet [Oxycodone-Acetaminophen] Other (See Comments)    hallucinations   No current facility-administered medications on file prior to encounter.    Current Outpatient Medications on File Prior to Encounter  Medication Sig Dispense Refill  . albuterol (PROVENTIL HFA;VENTOLIN HFA) 108 (90 Base) MCG/ACT inhaler Inhale 2 puffs into the lungs every 4 (four) hours as needed for wheezing or shortness of breath (cough, shortness of breath or wheezing.). 1 Inhaler 1  . aspirin EC 81 MG EC tablet Take 1 tablet (81 mg total) by mouth daily. 30 tablet 1  . metoprolol tartrate (LOPRESSOR) 25 MG tablet Take 0.5 tablets (12.5 mg total) by mouth 2 (two) times daily. 180 tablet 3    Social History   Socioeconomic History  . Marital status: Widowed    Spouse name: Not on file  . Number of children: Not on  file  . Years of education: Not on file  . Highest education level: Not on file  Occupational History  . 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 Financial resource strain: Not on file  . Food insecurity:    Worry: Not on file    Inability: Not on file  . Transportation needs:    Medical: Not on file    Non-medical: Not on file  Tobacco Use  . Smoking status: Never Smoker  . Smokeless tobacco: Never Used  Substance and Sexual Activity  . Alcohol use: Yes    Alcohol/week: 2.0 standard drinks    Types: 2 Glasses of wine per week    Comment: occassional  . Drug use: No  . Sexual activity: Not Currently  Lifestyle  . Physical activity:    Days per week: Not on file    Minutes per session: Not on file  . Stress: Not on file  Relationships  . Social connections:    Talks on phone: Not on file    Gets together: Not on file    Attends religious service: Not on file    Active member of club or organization: Not on file    Attends meetings of clubs or organizations: Not on file    Relationship status: Not on file  . Intimate partner violence:    Fear of current or ex partner:  Not on file    Emotionally abused: Not on file    Physically abused: Not on file    Forced sexual activity: Not on file  Other Topics Concern  . Not on file  Social History Narrative  . Not on file   Family History  Problem Relation Age of Onset  . Cancer Father        colon and later "throat;" in remission     OBJECTIVE:  Vitals:   09/01/18 1034  BP: 130/85  Pulse: 80  Resp: 20  Temp: 98.1 F (36.7 C)  TempSrc: Oral  SpO2: 95%     General appearance: Alert, appears mildly fatigued, but nontoxic; speaking in full sentences without difficulty HEENT:NCAT; Ears: EACs clear, TMs pearly gray; Eyes: PERRL.  EOM grossly intact. Nose: nares patent without rhinorrhea; Throat: tonsils nonerythematous or enlarged, uvula midline  Neck:  supple without LAD Lungs: mild subtle wheezes; persistent cough throughout; mild improvement with duo-neb Heart: regular rate and rhythm.  Radial pulses 2+ symmetrical bilaterally Skin: warm and dry Psychological: alert and cooperative; normal mood and affect  DIAGNOSTIC STUDIES:  Dg Chest 2 View  Result Date: 09/01/2018 CLINICAL DATA:  Cough for 2 weeks EXAM: CHEST - 2 VIEW COMPARISON:  Is 08/13/2018 FINDINGS: Reticulonodular opacities at the bases. Borderline heart size. Mild aortic tortuosity. No edema, effusion, or pneumothorax. Spondylosis. IMPRESSION: Mild opacity at the bases that is suspicious for bronchopneumonia. Electronically Signed   By: Marnee Spring M.D.   On: 09/01/2018 11:12     ASSESSMENT & PLAN:  1. Bronchopneumonia     Meds ordered this encounter  Medications  . ipratropium-albuterol (DUONEB) 0.5-2.5 (3) MG/3ML nebulizer solution 3 mL  . amoxicillin (AMOXIL) 500 MG capsule    Sig: Take 1 capsule (500 mg total) by mouth 2 (two) times daily for 10 days.    Dispense:  20 capsule    Refill:  0    Order Specific Question:   Supervising Provider    Answer:   Eustace Moore [[id number]]  . azithromycin (ZITHROMAX) 250 MG tablet    Sig: Take 1 tablet (250 mg total) by mouth daily. Take first 2 tablets together, then 1 every day until finished.    Dispense:  6 tablet    Refill:  0    Order Specific Question:   Supervising Provider    Answer:   Eustace Moore [[id number]]  . ketorolac (TORADOL) injection 60 mg  . albuterol (PROVENTIL HFA;VENTOLIN HFA) 108 (90 Base) MCG/ACT inhaler 2 puff    Orders Placed This Encounter  Procedures  . DG Chest 2 View    Standing Status:   Standing    Number of Occurrences:   1    Order Specific Question:   Reason for Exam (SYMPTOM  OR DIAGNOSIS REQUIRED)    Answer:   persistent cough x 2 weeks    Toradol shot for headache  X-rays showed a bronchopneumonia Duo-neb given in office with relief Inhaler given in office.  Use  as needed for shortness of breath and/or wheezing Get plenty of rest and push fluids Use OTC medications as needed for symptomatic relief of fever and body aches Amoxicillin and azithromycin prescribed.  Take as directed and to completion Follow up with PCP next week for recheck and to ensure symptoms are improving Return or go to ER if you have any new or worsening symptoms fever, chills, fatigue, sinus pain, runny nose, sore throat, wheezing, chest pain, nausea, changes in bowel or  bladder habits, etc...  Recommend repeat x-ray in 6 weeks to exclude underlying malignancy   Reviewed expectations re: course of current medical issues. Questions answered. Outlined signs and symptoms indicating need for more acute intervention. Patient verbalized understanding. After Visit Summary given.          Rennis Harding, PA-C 09/01/18 1146

## 2018-09-01 NOTE — ED Triage Notes (Signed)
Seen 2 weeks ago for uri.  Coughing continues and has headache.  Patient denies a productive cough

## 2018-09-01 NOTE — Discharge Instructions (Signed)
X-rays showed a bronchopneumonia Duo-neb given in office with relief Inhaler given in office.  Use as needed for shortness of breath and/or wheezing Get plenty of rest and push fluids Use OTC medications as needed for symptomatic relief of fever and body aches Amoxicillin and azithromycin prescribed.  Take as directed and to completion Follow up with PCP next week for recheck and to ensure symptoms are improving Return or go to ER if you have any new or worsening symptoms fever, chills, fatigue, sinus pain, runny nose, sore throat, wheezing, chest pain, nausea, changes in bowel or bladder habits, etc...  Recommend repeat x-ray in 6 weeks to exclude underlying malignancy

## 2018-09-28 ENCOUNTER — Other Ambulatory Visit: Payer: Self-pay

## 2018-09-28 ENCOUNTER — Ambulatory Visit (HOSPITAL_COMMUNITY)
Admission: EM | Admit: 2018-09-28 | Discharge: 2018-09-28 | Disposition: A | Discharge status: Home / Self Care | Payer: Self-pay | Attending: Family Medicine | Admitting: Family Medicine

## 2018-09-28 ENCOUNTER — Encounter (HOSPITAL_COMMUNITY): Payer: Self-pay | Admitting: Emergency Medicine

## 2018-09-28 DIAGNOSIS — Z760 Encounter for issue of repeat prescription: Secondary | ICD-10-CM

## 2018-09-28 DIAGNOSIS — R059 Cough, unspecified: Secondary | ICD-10-CM

## 2018-09-28 DIAGNOSIS — R05 Cough: Secondary | ICD-10-CM

## 2018-09-28 MED ORDER — PROMETHAZINE-CODEINE 6.25-10 MG/5ML PO SYRP: 5.0000 mL | ORAL_SOLUTION | Freq: Every evening | ORAL | 0 refills | Status: DC | PRN

## 2018-09-28 MED ORDER — METOPROLOL TARTRATE 25 MG PO TABS: 12.5000 mg | ORAL_TABLET | Freq: Two times a day (BID) | ORAL | 1 refills | Status: DC

## 2018-09-28 NOTE — ED Provider Notes (Signed)
[facility]    CSN: [id number] Arrival date & time: 09/28/18  1009     History   Chief Complaint Chief Complaint  Patient presents with  . Medication Refill  . Cough    HPI Michele Gross is a 63 y.o. female.   HPI Medication Refill Requesting a refill on metoprolol for hypertension management. Patient is without PCP.   Cough Diagnosed and treated for bronchopneumonia on 09/01/18. Prior to that visit, patient was treated for a lower respiratory tract infection on 08/13/18 with antibiotics and symptoms failed to improve. Today she reports feeling almost completely better, but continues to struggle with a cough at night time that interferes with sleep. She was taking benzonatate without significant improvement. She prefers to try cough syrup if possible.     Past Medical History:  Diagnosis Date  . Chest pain    Negative nuclear stress 2007, negative stress echo 2011, reported negative cath 2000; negative stress echo November 2013  . Chronic shoulder pain   . DJD (degenerative joint disease)   . GERD (gastroesophageal reflux disease)   . Hypertension   . Obesity   . Sleep apnea     Patient Active Problem List   Diagnosis Date Noted  . Cervical radiculopathy due to degenerative joint disease of spine 05/02/2012  . Chest pain, atypical 05/02/2012  . HYPERLIPIDEMIA 07/01/2007  . GERD 07/01/2007  . BACK PAIN 07/01/2007    Past Surgical History:  Procedure Laterality Date  . PARTIAL HYSTERECTOMY      OB History   No obstetric history on file.      Home Medications    Prior to Admission medications   Medication Sig Start Date End Date Taking? Authorizing Provider  albuterol (PROVENTIL HFA;VENTOLIN HFA) 108 (90 Base) MCG/ACT inhaler Inhale 2 puffs into the lungs every 4 (four) hours as needed for wheezing or shortness of breath (cough, shortness of breath or wheezing.). 08/10/17   Elvina Sidle, MD  aspirin EC 81 MG EC tablet Take 1 tablet (81 mg  total) by mouth daily. 05/03/12   Kristie Cowman, MD  azithromycin (ZITHROMAX) 250 MG tablet Take 1 tablet (250 mg total) by mouth daily. Take first 2 tablets together, then 1 every day until finished. 09/01/18   Wurst, [place], PA-C  metoprolol tartrate (LOPRESSOR) 25 MG tablet Take 0.5 tablets (12.5 mg total) by mouth 2 (two) times daily. 09/28/18   Bing Neighbors, FNP  promethazine-codeine (PHENERGAN WITH CODEINE) 6.25-10 MG/5ML syrup Take 5 mLs by mouth at bedtime as needed for cough. 09/28/18   Bing Neighbors, FNP    Family History Family History  Problem Relation Age of Onset  . Cancer Father        colon and later "throat;" in remission    Social History Social History   Tobacco Use  . Smoking status: Never Smoker  . Smokeless tobacco: Never Used  Substance Use Topics  . Alcohol use: Yes    Alcohol/week: 2.0 standard drinks    Types: 2 Glasses of wine per week    Comment: occassional  . Drug use: No     Allergies   Percocet [oxycodone-acetaminophen]   Review of Systems Review of Systems Pertinent negatives listed in HPI Physical Exam Triage Vital Signs ED Triage Vitals  Enc Vitals Group     BP 09/28/18 1028 133/87     Pulse Rate 09/28/18 1028 74     Resp 09/28/18 1028 18     Temp 09/28/18 1028 98.6  F (37 C)     Temp Source 09/28/18 1028 Oral     SpO2 09/28/18 1028 95 %     Weight --      Height --      Head Circumference --      Peak Flow --      Pain Score 09/28/18 1026 0     Pain Loc --      Pain Edu? --      Excl. in GC? --    No data found.  Updated Vital Signs BP 133/87 (BP Location: Left Arm)   Pulse 74   Temp 98.6 F (37 C) (Oral)   Resp 18   SpO2 95%   Visual Acuity Right Eye Distance:   Left Eye Distance:   Bilateral Distance:    Right Eye Near:   Left Eye Near:    Bilateral Near:     Physical Exam General appearance: alert, well developed, well nourished, cooperative and in no distress Head: Normocephalic, without  obvious abnormality, atraumatic Respiratory: Respirations even and unlabored, normal respiratory rate, no wheezing, no rhonchi Heart: rate and rhythm normal. No gallop or murmurs noted on exam  Extremities: No gross deformities Skin: Skin color, texture, turgor normal. No rashes seen  Psych: Appropriate mood and affect. Neurologic: Mental status: Alert, oriented to person, place, and time, thought content appropriate. UC Treatments / Results  Labs (all labs ordered are listed, but only abnormal results are displayed) Labs Reviewed - No data to display  EKG None  Radiology No results found.  Procedures Procedures (including critical care time)  Medications Ordered in UC Medications - No data to display  Initial Impression / Assessment and Plan / UC Course  I have reviewed the triage vital signs and the nursing notes.  Pertinent labs & imaging results that were available during my care of the patient were reviewed by me and considered in my medical decision making (see chart for details).     [name] recently diagnosed with pneumonia  09/01/18 continues to have a residual nocturnal cough which is non-productive. She is not experiencing any worrisome respiratory symptoms which are concerning for recurrent pneumonia.Will start PRN Promethazine-Codeine for cough and continue albuterol as needed for persistent cough, wheezing, or shortness of breath. Final Clinical Impressions(s) / UC Diagnoses   Final diagnoses:  Cough  Encounter for medication refill   Discharge Instructions   None    ED Prescriptions    Medication Sig Dispense Auth. Provider   metoprolol tartrate (LOPRESSOR) 25 MG tablet Take 0.5 tablets (12.5 mg total) by mouth 2 (two) times daily. 30 tablet Bing NeighborsHarris, Talma Aguillard S, FNP   promethazine-codeine (PHENERGAN WITH CODEINE) 6.25-10 MG/5ML syrup Take 5 mLs by mouth at bedtime as needed for cough. 120 mL Bing NeighborsHarris, Placida Cambre S, FNP     Controlled Substance Prescriptions [place]  Controlled Substance Registry consulted? Not Applicable   Bing NeighborsHarris, Avilene Marrin S, FNP 09/30/18 1321

## 2018-09-28 NOTE — ED Triage Notes (Signed)
Seen 3/11 for a cough, but says cough ins not better.    Needs medication refill of metoprolol.  Last had dose this morning-no more doses left

## 2018-10-18 ENCOUNTER — Other Ambulatory Visit: Payer: Self-pay

## 2018-10-18 ENCOUNTER — Encounter (HOSPITAL_COMMUNITY): Payer: Self-pay | Admitting: Emergency Medicine

## 2018-10-18 ENCOUNTER — Emergency Department (HOSPITAL_COMMUNITY): Payer: Self-pay

## 2018-10-18 ENCOUNTER — Emergency Department (HOSPITAL_COMMUNITY)
Admission: EM | Admit: 2018-10-18 | Discharge: 2018-10-18 | Disposition: A | Discharge status: Home / Self Care | Payer: Self-pay | Attending: Emergency Medicine | Admitting: Emergency Medicine

## 2018-10-18 DIAGNOSIS — M1990 Unspecified osteoarthritis, unspecified site: Secondary | ICD-10-CM | POA: Insufficient documentation

## 2018-10-18 DIAGNOSIS — M25561 Pain in right knee: Secondary | ICD-10-CM | POA: Insufficient documentation

## 2018-10-18 DIAGNOSIS — M25562 Pain in left knee: Secondary | ICD-10-CM | POA: Insufficient documentation

## 2018-10-18 NOTE — ED Notes (Signed)
Pt given discharge instructions and follow up information. Pt given the opportunity to ask questions. Pt verbalized understanding. Pt discharged without incident.  

## 2018-10-18 NOTE — Discharge Instructions (Signed)
You were seen in the ED today for pain to both of your knees; your xrays showed arthritis which is likely what is causing your pain. You may apply heat to the knees for relief (see attached paper on heat therapy). You may take Tylenol Arthritis for your symptoms as well. Please follow up with your PCP; if you do not have one you may follow up with [facility] for your primary care needs. Return to the ED for any worsening pain, unilateral leg swelling, redness to the joint,fever, chest pain, shortness of breath.

## 2018-10-18 NOTE — ED Triage Notes (Signed)
Pt. Stated, IVE BEEN HAVING BILATERAL LEG PAIN SINCE Friday.

## 2018-10-18 NOTE — ED Provider Notes (Signed)
[facility] EMERGENCY DEPARTMENT Provider Note   CSN: [id number] Arrival date & time: 10/18/18  1026    History   Chief Complaint Chief Complaint  Patient presents with   Leg Pain    HPI Michele Gross is a 63 y.o. female with PMHx DJD and HTN who presents to the ED complaining of bilateral knee pain x 2 weeks, gradually worsening over the past 3 days. No known injury or trauma to the knees. She reports her right knee pain is mostly located to the posterior aspect and her left knee pain is diffuse. It is worsened with walking. Pt reports she has been told she has RA and was on methotrexate but has been out for about a month now. No recent prolonged travel or immobilization. Pt is not on estrogen therapy. No active malignancy. Pt is non smoker. Denies fever, chills, chest pain, shortness of breath, palpitations, leg swelling, weakness, numbness, or any other associated symptoms.         Past Medical History:  Diagnosis Date   Chest pain    Negative nuclear stress 2007, negative stress echo 2011, reported negative cath 2000; negative stress echo November 2013   Chronic shoulder pain    DJD (degenerative joint disease)    GERD (gastroesophageal reflux disease)    Hypertension    Obesity    Sleep apnea     Patient Active Problem List   Diagnosis Date Noted   Cervical radiculopathy due to degenerative joint disease of spine 05/02/2012   Chest pain, atypical 05/02/2012   HYPERLIPIDEMIA 07/01/2007   GERD 07/01/2007   BACK PAIN 07/01/2007    Past Surgical History:  Procedure Laterality Date   PARTIAL HYSTERECTOMY       OB History   No obstetric history on file.      Home Medications    Prior to Admission medications   Medication Sig Start Date End Date Taking? Authorizing Provider  albuterol (PROVENTIL HFA;VENTOLIN HFA) 108 (90 Base) MCG/ACT inhaler Inhale 2 puffs into the lungs every 4 (four) hours as needed for wheezing or  shortness of breath (cough, shortness of breath or wheezing.). 08/10/17   Elvina Sidle, MD  aspirin EC 81 MG EC tablet Take 1 tablet (81 mg total) by mouth daily. 05/03/12   Kristie Cowman, MD  azithromycin (ZITHROMAX) 250 MG tablet Take 1 tablet (250 mg total) by mouth daily. Take first 2 tablets together, then 1 every day until finished. 09/01/18   Wurst, [place], PA-C  metoprolol tartrate (LOPRESSOR) 25 MG tablet Take 0.5 tablets (12.5 mg total) by mouth 2 (two) times daily. 09/28/18   Bing Neighbors, FNP  promethazine-codeine (PHENERGAN WITH CODEINE) 6.25-10 MG/5ML syrup Take 5 mLs by mouth at bedtime as needed for cough. 09/28/18   Bing Neighbors, FNP    Family History Family History  Problem Relation Age of Onset   Cancer Father        colon and later "throat;" in remission    Social History Social History   Tobacco Use   Smoking status: Never Smoker   Smokeless tobacco: Never Used  Substance Use Topics   Alcohol use: Yes    Alcohol/week: 2.0 standard drinks    Types: 2 Glasses of wine per week    Comment: occassional   Drug use: No     Allergies   Percocet [oxycodone-acetaminophen]   Review of Systems Review of Systems  Constitutional: Negative for chills and fever.  HENT: Negative for congestion.  Respiratory: Negative for cough and shortness of breath.   Cardiovascular: Negative for chest pain, palpitations and leg swelling.  Gastrointestinal: Negative for abdominal pain, nausea and vomiting.  Genitourinary: Negative for dysuria and frequency.  Musculoskeletal: Positive for arthralgias.  Skin: Negative for color change.  Neurological: Negative for weakness and numbness.  Hematological: Does not bruise/bleed easily.     Physical Exam Updated Vital Signs BP (!) 138/93 (BP Location: Right Arm)    Pulse 76    Temp 99 F (37.2 C) (Oral)    Resp 17    Ht 5' 3.5" (1.613 m)    Wt 124.7 kg    SpO2 97%    BMI 47.95 kg/m   Physical Exam Vitals signs  and nursing note reviewed.  Constitutional:      Appearance: She is obese. She is not ill-appearing.  HENT:     Head: Normocephalic and atraumatic.  Eyes:     Conjunctiva/sclera: Conjunctivae normal.  Cardiovascular:     Rate and Rhythm: Normal rate and regular rhythm.  Pulmonary:     Effort: Pulmonary effort is normal.     Breath sounds: Normal breath sounds.  Musculoskeletal:     Comments: No obvious deformity, swelling, ecchymosis, erythema to the bilateral knees. Mild tenderness to posterior aspect of right knee, no effusion palpation. Tenderness to medial aspect of left knee. Crepitus appreciated with ROM bilaterally, ROM intact. Negative anterior and posterior drawer test. No varus or valgus laxity. Strength 5/5 with knee flexion and extension. 2+ DP pulses.   Skin:    General: Skin is warm and dry.     Coloration: Skin is not jaundiced.  Neurological:     Mental Status: She is alert.      ED Treatments / Results  Labs (all labs ordered are listed, but only abnormal results are displayed) Labs Reviewed - No data to display  EKG None  Radiology Dg Knee Complete 4 Views Left  Result Date: 10/18/2018 CLINICAL DATA:  Acute on chronic bilateral knee pain.  No injury. EXAM: LEFT KNEE - COMPLETE 4+ VIEW; RIGHT KNEE - COMPLETE 4+ VIEW COMPARISON:  Bilateral knee x-rays dated January 10, 2012. FINDINGS: Left knee: No acute fracture or dislocation. Chronic fracture deformities of the lateral tibial plateau and fibular head are unchanged. No joint effusion. Mild tricompartmental degenerative changes with joint space narrowing and marginal osteophytes, progressed in the patellofemoral compartment. Bone mineralization is normal. Soft tissues are unremarkable. Right knee: No acute fracture or dislocation. No joint effusion. Tricompartmental degenerative changes have mildly progressed, most prominent in the medial compartment. Old proximal MCL injury. Bone mineralization is normal. Soft tissues  are unremarkable. IMPRESSION: 1.  No acute osseous abnormality. 2. Progressive mild tricompartmental osteoarthritis, most prominent in the left patellofemoral and right medial compartments. Electronically Signed   By: Obie DredgeWilliam T Derry M.D.   On: 10/18/2018 11:54   Dg Knee Complete 4 Views Right  Result Date: 10/18/2018 CLINICAL DATA:  Acute on chronic bilateral knee pain.  No injury. EXAM: LEFT KNEE - COMPLETE 4+ VIEW; RIGHT KNEE - COMPLETE 4+ VIEW COMPARISON:  Bilateral knee x-rays dated January 10, 2012. FINDINGS: Left knee: No acute fracture or dislocation. Chronic fracture deformities of the lateral tibial plateau and fibular head are unchanged. No joint effusion. Mild tricompartmental degenerative changes with joint space narrowing and marginal osteophytes, progressed in the patellofemoral compartment. Bone mineralization is normal. Soft tissues are unremarkable. Right knee: No acute fracture or dislocation. No joint effusion. Tricompartmental degenerative changes have mildly progressed,  most prominent in the medial compartment. Old proximal MCL injury. Bone mineralization is normal. Soft tissues are unremarkable. IMPRESSION: 1.  No acute osseous abnormality. 2. Progressive mild tricompartmental osteoarthritis, most prominent in the left patellofemoral and right medial compartments. Electronically Signed   By: Obie Dredge M.D.   On: 10/18/2018 11:54    Procedures Procedures (including critical care time)  Medications Ordered in ED Medications - No data to display   Initial Impression / Assessment and Plan / ED Course  I have reviewed the triage vital signs and the nursing notes.  Pertinent labs & imaging results that were available during my care of the patient were reviewed by me and considered in my medical decision making (see chart for details).    Pt is a 63 year old female who presents with bilateral knee pain x 2 weeks. Hx of RA, off of methotrexate for the past month. Suspect  arthritis at this point given pain is worse with ambulation. No effusions appreciated; no erythema or increased warmth to the touch. Pt afebrile in the ED. Do not suspect infectious etiology. Will get DG Knees to assess for arthritis. Pt does not want anything for pain at this point.   Xrays show arthritic changes; will discharge patient home with heat therapy instructions as well as instructed to take Tylenol Arthritis for pain. Encouraged patient to follow up with PCP regarding need for methotrexate; [place] [facility] resources given as well if patient does not have PCP. Pt stable for discharge at this time.        Final Clinical Impressions(s) / ED Diagnoses   Final diagnoses:  Acute pain of both knees  Arthritis    ED Discharge Orders    None       Tanda Rockers, PA-C 10/18/18 1509    Benjiman Core, MD 10/18/18 1535

## 2018-11-16 ENCOUNTER — Other Ambulatory Visit: Payer: Self-pay | Admitting: Family Medicine

## 2019-01-07 ENCOUNTER — Emergency Department (HOSPITAL_COMMUNITY): Payer: Self-pay

## 2019-01-07 ENCOUNTER — Other Ambulatory Visit: Payer: Self-pay

## 2019-01-07 ENCOUNTER — Emergency Department (HOSPITAL_COMMUNITY)
Admission: EM | Admit: 2019-01-07 | Discharge: 2019-01-08 | Disposition: A | Discharge status: Home / Self Care | Payer: Self-pay | Attending: Emergency Medicine | Admitting: Emergency Medicine

## 2019-01-07 DIAGNOSIS — Z79899 Other long term (current) drug therapy: Secondary | ICD-10-CM | POA: Insufficient documentation

## 2019-01-07 DIAGNOSIS — M1711 Unilateral primary osteoarthritis, right knee: Secondary | ICD-10-CM | POA: Insufficient documentation

## 2019-01-07 DIAGNOSIS — R0789 Other chest pain: Secondary | ICD-10-CM

## 2019-01-07 DIAGNOSIS — M1712 Unilateral primary osteoarthritis, left knee: Secondary | ICD-10-CM | POA: Insufficient documentation

## 2019-01-07 DIAGNOSIS — Z7982 Long term (current) use of aspirin: Secondary | ICD-10-CM | POA: Insufficient documentation

## 2019-01-07 DIAGNOSIS — I10 Essential (primary) hypertension: Secondary | ICD-10-CM | POA: Insufficient documentation

## 2019-01-07 DIAGNOSIS — M170 Bilateral primary osteoarthritis of knee: Secondary | ICD-10-CM

## 2019-01-07 LAB — BASIC METABOLIC PANEL
Anion gap: 6 (ref 5–15)
BUN: 13 mg/dL (ref 6–20)
CO2: 28 mmol/L (ref 22–32)
Calcium: 9.1 mg/dL (ref 8.9–10.3)
Chloride: 106 mmol/L (ref 98–111)
Creatinine, Ser: 0.78 mg/dL (ref 0.44–1.00)
GFR calc Af Amer: >60 mL/min (ref >60)
GFR calc non Af Amer: >60 mL/min (ref >60)
Glucose, Bld: 114 mg/dL — ABNORMAL HIGH (ref 70–99)
Potassium: 3.9 mmol/L (ref 3.5–5.1)
Sodium: 140 mmol/L (ref 135–145)

## 2019-01-07 LAB — TROPONIN I (HIGH SENSITIVITY): Troponin I (High Sensitivity): 4 ng/L (ref <18)

## 2019-01-07 LAB — CBC
HCT: 41.7 % (ref 36.0–46.0)
Hemoglobin: 13 g/dL (ref 12.0–15.0)
MCH: 26.5 pg (ref 26.0–34.0)
MCHC: 31.2 g/dL (ref 30.0–36.0)
MCV: 85.1 fL (ref 80.0–100.0)
Platelets: 285 10*3/uL (ref 150–400)
RBC: 4.9 MIL/uL (ref 3.87–5.11)
RDW: 15.9 % — ABNORMAL HIGH (ref 11.5–15.5)
WBC: 11.2 10*3/uL — ABNORMAL HIGH (ref 4.0–10.5)
nRBC: 0 % (ref 0.0–0.2)

## 2019-01-07 MED ORDER — SODIUM CHLORIDE 0.9% FLUSH
3.0000 mL | Freq: Once | INTRAVENOUS | Status: AC
  Administered 2019-01-08: 3 mL via INTRAVENOUS

## 2019-01-07 NOTE — ED Triage Notes (Signed)
Per pt she has been having knee both and leg pain for about 1 week. Pt said her back has been bothering her also due to the way her legs are bothering her. Her chest began to hurt tonight also and it is on both sides of her chest. Pt said no sob, no nausea, no vomiting. Does not radiate.

## 2019-01-08 LAB — TROPONIN I (HIGH SENSITIVITY): Troponin I (High Sensitivity): 3 ng/L (ref <18)

## 2019-01-08 MED ORDER — HYDROCODONE-ACETAMINOPHEN 5-325 MG PO TABS: 1.0000 | ORAL_TABLET | ORAL | 0 refills | Status: DC | PRN

## 2019-01-08 MED ORDER — IBUPROFEN 800 MG PO TABS
800.0000 mg | ORAL_TABLET | Freq: Once | ORAL | Status: AC
  Administered 2019-01-08: 800 mg via ORAL
  Filled 2019-01-08: qty 1

## 2019-01-08 MED ORDER — CELECOXIB 100 MG PO CAPS: 100.0000 mg | ORAL_CAPSULE | Freq: Two times a day (BID) | ORAL | 0 refills | Status: DC

## 2019-01-08 NOTE — ED Provider Notes (Signed)
[facility] Provider [facility]   CSN: [id number] Arrival date & time: 01/07/19  2225   History   Chief Complaint Chief Complaint  Patient presents with  . Chest Pain  . Leg Pain    both  . Back Pain    HPI Michele Gross is a 63 y.o. female.   The history is provided by the patient.  Chest Pain Associated symptoms: back pain   Leg Pain Associated symptoms: back pain   Back Pain Associated symptoms: chest pain and leg pain   She has history of hypertension, hyperlipidemia, degenerative joint disease, GERD and comes in complaining of bilateral knee pain for the last 5 days.  She has history of arthritis in her knees, but current pain is significantly worse than what she has had in the past.  Pain is worse with walking.  She rates pain at 10/10.  She is having back pain which she relates to posture change with trying to walk with her knees hurting.  She has taken acetaminophen which has given only slight relief.  Tonight at about 8 PM, she started having chest pain.  Pain was sharp and over the lower sternal area but has resolved.  She denies dyspnea, nausea, diaphoresis.  She is a non-smoker.  Past Medical History:  Diagnosis Date  . Chest pain    Negative nuclear stress 2007, negative stress echo 2011, reported negative cath 2000; negative stress echo November 2013  . Chronic shoulder pain   . DJD (degenerative joint disease)   . GERD (gastroesophageal reflux disease)   . Hypertension   . Obesity   . Sleep apnea     Patient Active Problem List   Diagnosis Date Noted  . Cervical radiculopathy due to degenerative joint disease of spine 05/02/2012  . Chest pain, atypical 05/02/2012  . HYPERLIPIDEMIA 07/01/2007  . GERD 07/01/2007  . BACK PAIN 07/01/2007    Past Surgical History:  Procedure Laterality Date  . PARTIAL HYSTERECTOMY       OB History   No obstetric history on file.      Home Medications    Prior to Admission  medications   Medication Sig Start Date End Date Taking? Authorizing Provider  albuterol (PROVENTIL HFA;VENTOLIN HFA) 108 (90 Base) MCG/ACT inhaler Inhale 2 puffs into the lungs every 4 (four) hours as needed for wheezing or shortness of breath (cough, shortness of breath or wheezing.). 08/10/17   Elvina SidleLauenstein, Kurt, MD  aspirin EC 81 MG EC tablet Take 1 tablet (81 mg total) by mouth daily. 05/03/12   Kristie CowmanSchooler, Karen, MD  azithromycin (ZITHROMAX) 250 MG tablet Take 1 tablet (250 mg total) by mouth daily. Take first 2 tablets together, then 1 every day until finished. 09/01/18   Wurst, [place], PA-C  metoprolol tartrate (LOPRESSOR) 25 MG tablet TAKE 1/2 TABLET(12.5 MG) BY MOUTH TWICE DAILY 11/16/18   Bing NeighborsHarris, Kimberly S, FNP  promethazine-codeine (PHENERGAN WITH CODEINE) 6.25-10 MG/5ML syrup Take 5 mLs by mouth at bedtime as needed for cough. 09/28/18   Bing NeighborsHarris, Kimberly S, FNP    Family History Family History  Problem Relation Age of Onset  . Cancer Father        colon and later "throat;" in remission    Social History Social History   Tobacco Use  . Smoking status: Never Smoker  . Smokeless tobacco: Never Used  Substance Use Topics  . Alcohol use: Yes    Alcohol/week: 2.0 standard drinks    Types: 2 Glasses  of wine per week    Comment: occassional  . Drug use: No     Allergies   Percocet [oxycodone-acetaminophen]   Review of Systems Review of Systems  Cardiovascular: Positive for chest pain.  Musculoskeletal: Positive for back pain.  All other systems reviewed and are negative.    Physical Exam Updated Vital Signs BP (!) 127/108 (BP Location: Right Arm)   Pulse 65   Temp 98 F (36.7 C) (Oral)   Resp (!) 21   SpO2 99%   Physical Exam Vitals signs and nursing note reviewed.    Morbidly obese 63 year old female, resting comfortably and in no acute distress. Vital signs are significant for elevated diastolic blood pressure and borderline elevated respiratory rate. Oxygen  saturation is 99%, which is normal. Head is normocephalic and atraumatic. PERRLA, EOMI. Oropharynx is clear. Neck is nontender and supple without adenopathy or JVD. Back is nontender and there is no CVA tenderness. Lungs are clear without rales, wheezes, or rhonchi. Chest is nontender. Heart has regular rate and rhythm without murmur. Abdomen is soft, flat, nontender without masses or hepatosplenomegaly and peristalsis is normoactive. Extremities have no cyanosis or edema, full range of motion is present.  No effusion in the knees.  Pain with passive range of motion of the knees. Skin is warm and dry without rash. Neurologic: Mental status is normal, cranial nerves are intact, there are no motor or sensory deficits.  ED Treatments / Results  Labs (all labs ordered are listed, but only abnormal results are displayed) Labs Reviewed  BASIC METABOLIC PANEL - Abnormal; Notable for the following components:      Result Value   Glucose, Bld 114 (*)    All other components within normal limits  CBC - Abnormal; Notable for the following components:   WBC 11.2 (*)    RDW 15.9 (*)    All other components within normal limits  I-STAT BETA HCG BLOOD, ED ([facility], [facility], [facility] ONLY)  TROPONIN I (HIGH SENSITIVITY)  TROPONIN I (HIGH SENSITIVITY)    EKG EKG Interpretation  Date/Time:  Friday January 07 2019 22:35:00 EDT Ventricular Rate:  71 PR Interval:  134 QRS Duration: 82 QT Interval:  400 QTC Calculation: 434 R Axis:   50 Text Interpretation:  Normal sinus rhythm Nonspecific ST and T wave abnormality Abnormal ECG RSR' or QR pattern in V1 suggests right ventricular conduction delay When compared with ECG of 01/15/2018, No significant change was found Confirmed by Dione BoozeGlick, Momin Misko ([id number]) on 01/07/2019 11:32:18 PM   Radiology Dg Chest 2 View  Result Date: 01/07/2019 CLINICAL DATA:  Central chest pain for 1 day EXAM: CHEST - 2 VIEW COMPARISON:  Radiograph 09/01/2018, CT 05/02/2012 FINDINGS: Accounting for  low volumes and body habitus, the lungs are clear. Cardiomegaly is similar to prior studies. No pneumothorax or effusion. Degenerative changes are present in the and imaged spine and shoulders. No acute osseous or soft tissue abnormality. IMPRESSION: Low volumes, otherwise clear lungs. Electronically Signed   By: Kreg ShropshirePrice  DeHay M.D.   On: 01/07/2019 23:03    Procedures Procedures  Medications Ordered in ED Medications  sodium chloride flush (NS) 0.9 % injection 3 mL (3 mLs Intravenous Given 01/08/19 0124)  ibuprofen (ADVIL) tablet 800 mg (800 mg Oral Given 01/08/19 0127)     Initial Impression / Assessment and Plan / ED Course  I have reviewed the triage vital signs and the nursing notes.  Pertinent labs & imaging results that were available during my care of  the patient were reviewed by me and considered in my medical decision making (see chart for details).  Bilateral knee pain which seems to be exacerbation of osteoarthritis.  Old records are reviewed, and she had been seen in April for bilateral knee pain at which time x-ray showed significant tricompartmental osteoarthritis in both knees.  With no recent trauma, no indication for repeat x-ray.  Chest pain also appears to be musculoskeletal.  Heart score is 3, which puts her at low risk for major adverse cardiac events in the next 6 weeks.  She has been evaluated for heart disease in the past and was reported to have had a normal cardiac catheterization in 2000 and had negative stress echocardiogram in 2013.  Initial ECG is unchanged from baseline although minor ST and T changes are present.  Troponin is normal and chest x-ray is unremarkable.  Repeat troponin is pending.  She will be given a dose of ibuprofen.  She is at very low risk for pulmonary embolism, negative Well's Critieria, so I do not feel a need to obtain d-dimer.  Repeat troponin is normal.  She had good relief of symptoms with ibuprofen.  Unfortunately, she states that she cannot  take naproxen because it upsets her stomach.  She will be discharged with prescription for celecoxib as well as a small number of hydrocodone-acetaminophen tablets.  She is referred back to her primary care provider for follow-up.  Return precautions discussed.  Final Clinical Impressions(s) / ED Diagnoses   Final diagnoses:  Musculoskeletal chest pain  Primary osteoarthritis of both knees    ED Discharge Orders         Ordered    HYDROcodone-acetaminophen (NORCO) 5-325 MG tablet  Every 4 hours PRN     01/08/19 0230    celecoxib (CELEBREX) 100 MG capsule  2 times daily     01/08/19 5035           Delora Fuel, MD 46/56/81 [phone]

## 2019-01-08 NOTE — ED Notes (Signed)
Patient verbalizes understanding of discharge instructions. Opportunity for questioning and answers were provided. Armband removed by staff, pt discharged from ED.  

## 2019-01-08 NOTE — Discharge Instructions (Addendum)
Apply ice to painful areas as needed.  Return if symptoms are getting worse.

## 2019-03-01 ENCOUNTER — Encounter (HOSPITAL_COMMUNITY): Payer: Self-pay | Admitting: Emergency Medicine

## 2019-03-01 ENCOUNTER — Ambulatory Visit (HOSPITAL_COMMUNITY)
Admission: EM | Admit: 2019-03-01 | Discharge: 2019-03-01 | Disposition: A | Discharge status: Home / Self Care | Payer: Self-pay | Attending: Family Medicine | Admitting: Family Medicine

## 2019-03-01 ENCOUNTER — Other Ambulatory Visit: Payer: Self-pay

## 2019-03-01 DIAGNOSIS — I10 Essential (primary) hypertension: Secondary | ICD-10-CM

## 2019-03-01 DIAGNOSIS — R519 Headache, unspecified: Secondary | ICD-10-CM

## 2019-03-01 MED ORDER — METOPROLOL TARTRATE 25 MG PO TABS: ORAL_TABLET | ORAL | 1 refills | Status: DC

## 2019-03-01 NOTE — ED Triage Notes (Signed)
PT ran out of BP meds Friday. Does not have PCP. Headache started yesterday.

## 2019-03-01 NOTE — ED Provider Notes (Signed)
[facility]   [id number] 03/01/19 Arrival Time: 0802  ASSESSMENT & PLAN:  1. Essential hypertension   2. Acute nonintractable headache, unspecified headache type     Meds ordered this encounter  Medications  . metoprolol tartrate (LOPRESSOR) 25 MG tablet    Sig: TAKE 1/2 TABLET(12.5 MG) BY MOUTH TWICE DAILY    Dispense:  30 tablet    Refill:  1   She feels confident HA will resolve upon resuming her metoprolol. OTC analgesics as needed.  Follow-up Information    Schedule an appointment as soon as possible for a visit  with [facility].   Contact information: [address] [place] [id number] [phone]         Reviewed expectations re: course of current medical issues. Questions answered. Outlined signs and symptoms indicating need for more acute intervention. Patient verbalized understanding. After Visit Summary given.   SUBJECTIVE:  Michele Gross is a 63 y.o. female who presents with concerns regarding increased blood pressures. She reports that she is treated for HTN; ran out of medications several days ago and requests refill. Does report gradual onset of a mild and generalized headache over the past 1-2 days. Describes as throbbing when at its worst; has not limited daily activities. No associated photophobia, n/v, visual changes. No extremity sensation changes or weakness. Ambulatory without difficulty. Not the worst headache of her life.  She reports taking medications as instructed, no medication side effects noted, no TIA's, no chest pain on exertion, no dyspnea on exertion and no swelling of ankles.  Denies symptoms of chest pain, palpations, orthopnea, nocturnal dyspnea, or LE edema.  Social History   Tobacco Use  Smoking Status Never Smoker  Smokeless Tobacco Never Used    ROS: As per HPI. All other systems negative.    OBJECTIVE:  Vitals:   03/01/19 0824  BP: (!) 154/85  Pulse:  73  Resp: 16  Temp: 98.3 F (36.8 C)  TempSrc: Oral  SpO2: 93%    General appearance: alert; no distress HENT: normocephalic; atraumatic Neck: supple Lungs: clear to auscultation bilaterally Heart: regular rate and rhythm Extremities: no edema; symmetrical with no gross deformities Skin: warm and dry Neuro: cranial nerves 2-12 grossly intact; PERRLA; EOMI; neck supple; normal extremity strength and sensation; normal gait Psychological: alert and cooperative; normal mood and affect   Labs Reviewed: Results for orders placed or performed during the hospital encounter of 01/07/19  Basic metabolic panel  Result Value Ref Range   Sodium 140 135 - 145 mmol/L   Potassium 3.9 3.5 - 5.1 mmol/L   Chloride 106 98 - 111 mmol/L   CO2 28 22 - 32 mmol/L   Glucose, Bld 114 (H) 70 - 99 mg/dL   BUN 13 6 - 20 mg/dL   Creatinine, Ser 8.460.78 0.44 - 1.00 mg/dL   Calcium 9.1 8.9 - 96.210.3 mg/dL   GFR calc non Af Amer >60 >60 mL/min   GFR calc Af Amer >60 >60 mL/min   Anion gap 6 5 - 15  CBC  Result Value Ref Range   WBC 11.2 (H) 4.0 - 10.5 K/uL   RBC 4.90 3.87 - 5.11 MIL/uL   Hemoglobin 13.0 12.0 - 15.0 g/dL   HCT 95.241.7 84.136.0 - 32.446.0 %   MCV 85.1 80.0 - 100.0 fL   MCH 26.5 26.0 - 34.0 pg   MCHC 31.2 30.0 - 36.0 g/dL   RDW 40.115.9 (H) 02.711.5 - 25.315.5 %  Platelets 285 150 - 400 K/uL   nRBC 0.0 0.0 - 0.2 %  Troponin I (High Sensitivity)  Result Value Ref Range   Troponin I (High Sensitivity) 4 <18 ng/L  Troponin I (High Sensitivity)  Result Value Ref Range   Troponin I (High Sensitivity) 3 <18 ng/L    Allergies  Allergen Reactions  . Percocet [Oxycodone-Acetaminophen] Other (See Comments)    hallucinations    Past Medical History:  Diagnosis Date  . Chest pain    Negative nuclear stress 2007, negative stress echo 2011, reported negative cath 2000; negative stress echo November 2013  . Chronic shoulder pain   . DJD (degenerative joint disease)   . GERD (gastroesophageal reflux disease)   .  Hypertension   . Obesity   . Sleep apnea    Social History   Socioeconomic History  . Marital status: Widowed    Spouse name: Not on file  . Number of children: Not on file  . Years of education: Not on file  . Highest education level: Not on file  Occupational History  . 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 Financial resource strain: Not on file  . Food insecurity    Worry: Not on file    Inability: Not on file  . Transportation needs    Medical: Not on file    Non-medical: Not on file  Tobacco Use  . Smoking status: Never Smoker  . Smokeless tobacco: Never Used  Substance and Sexual Activity  . Alcohol use: Yes    Alcohol/week: 2.0 standard drinks    Types: 2 Glasses of wine per week    Comment: occassional  . Drug use: No  . Sexual activity: Not Currently  Lifestyle  . Physical activity    Days per week: Not on file    Minutes per session: Not on file  . Stress: Not on file  Relationships  . Social Herbalist on phone: Not on file    Gets together: Not on file    Attends religious service: Not on file    Active member of club or organization: Not on file    Attends meetings of clubs or organizations: Not on file    Relationship status: Not on file  . Intimate partner violence    Fear of current or ex partner: Not on file    Emotionally abused: Not on file    Physically abused: Not on file    Forced sexual activity: Not on file  Other Topics Concern  . Not on file  Social History Narrative  . Not on file   Family History  Problem Relation Age of Onset  . Cancer Father        colon and later "throat;" in remission   Past Surgical History:  Procedure Laterality Date  . PARTIAL HYSTERECTOMY        Vanessa Kick, MD 03/01/19 1035

## 2019-03-14 ENCOUNTER — Encounter (HOSPITAL_COMMUNITY): Payer: Self-pay

## 2019-03-14 ENCOUNTER — Emergency Department (HOSPITAL_COMMUNITY): Payer: Self-pay

## 2019-03-14 ENCOUNTER — Other Ambulatory Visit: Payer: Self-pay

## 2019-03-14 ENCOUNTER — Emergency Department (HOSPITAL_COMMUNITY)
Admission: EM | Admit: 2019-03-14 | Discharge: 2019-03-15 | Disposition: A | Discharge status: Home / Self Care | Payer: Self-pay | Attending: Emergency Medicine | Admitting: Emergency Medicine

## 2019-03-14 DIAGNOSIS — Z7982 Long term (current) use of aspirin: Secondary | ICD-10-CM | POA: Insufficient documentation

## 2019-03-14 DIAGNOSIS — W19XXXA Unspecified fall, initial encounter: Secondary | ICD-10-CM

## 2019-03-14 DIAGNOSIS — I10 Essential (primary) hypertension: Secondary | ICD-10-CM | POA: Insufficient documentation

## 2019-03-14 DIAGNOSIS — R51 Headache: Secondary | ICD-10-CM | POA: Insufficient documentation

## 2019-03-14 DIAGNOSIS — F0781 Postconcussional syndrome: Secondary | ICD-10-CM | POA: Insufficient documentation

## 2019-03-14 DIAGNOSIS — Z79899 Other long term (current) drug therapy: Secondary | ICD-10-CM | POA: Insufficient documentation

## 2019-03-14 MED ORDER — FENTANYL CITRATE (PF) 100 MCG/2ML IJ SOLN
100.0000 ug | Freq: Once | INTRAMUSCULAR | Status: AC
  Administered 2019-03-15: 100 ug via INTRAVENOUS
  Filled 2019-03-14: qty 2

## 2019-03-14 NOTE — ED Notes (Signed)
Patient transported to X-ray 

## 2019-03-14 NOTE — ED Triage Notes (Signed)
Pt reports she was hanging some curtains on Friday when she fell onto the bed from the step ladder and then from the bed to floor. States she hit the back of her head on the dresser. No LOC, denies blood thinners. Pt c.o feeling "fuzzy" and c.o right hip pain. Pt using cane since the fall. Pt a.o, ambulatory

## 2019-03-14 NOTE — ED Provider Notes (Signed)
[facility]   CSN: [id number] Arrival date & time: 03/14/19  1925     History   Chief Complaint Chief Complaint  Patient presents with   Fall   Hip Pain    HPI Michele Gross is a 63 y.o. female with a h/o of DJD, chronic shoulder pain, HTN, obesity, cervical radiculopathy, and sleep apnea who presents to the emergency department with a chief complaint of fall that occurred 3 days ago.  The patient reports that she was standing on a stepladder hanging curtains.  She reports that she went to step up onto the third step and lost her footing and fell backwards onto her bed.  She reports that she been somersaulted off of the bed and fell backwards onto her floor, landing on her back.  She reports that she hit her head, but did not have a syncopal episode.  She reports that she laid on the floor for approximately 25 minutes before she was able to get up and walk.  Later that day after the incident, she reports that she began to develop some blurred vision bilaterally.  She reports that she awoke with room spinning dizziness the next morning, which has been constant since onset accompanied by a constant, throbbing posterior headache.  She had one episode of non-bloody, nonbilious vomiting 2 days ago, which has since resolved.  She reports that she typically ambulates at baseline independently, but has been feeling more off balance and has required use of a cane since the fall.  She presents to the ER today as symptoms have been worsening since onset.   She also reports pain diffusely to her neck, back, and right hip.  She states that she is now having some numbness along the right lateral hip that extends to the level of the knee.  No weakness, left-sided numbness, urinary or fecal incontinence, loss of vision, diplopia, slurred speech, facial droop, change in visual fields, hematuria, chest pain, shortness of breath, or  lightheadedness.  She reports that she has been taking her home Celebrex since the fall with no improvement in her symptoms.     The history is provided by the patient. No language interpreter was used.    Past Medical History:  Diagnosis Date   Chest pain    Negative nuclear stress 2007, negative stress echo 2011, reported negative cath 2000; negative stress echo November 2013   Chronic shoulder pain    DJD (degenerative joint disease)    GERD (gastroesophageal reflux disease)    Hypertension    Obesity    Sleep apnea     Patient Active Problem List   Diagnosis Date Noted   Cervical radiculopathy due to degenerative joint disease of spine 05/02/2012   Chest pain, atypical 05/02/2012   HYPERLIPIDEMIA 07/01/2007   GERD 07/01/2007   BACK PAIN 07/01/2007    Past Surgical History:  Procedure Laterality Date   PARTIAL HYSTERECTOMY       OB History   No obstetric history on file.      Home Medications    Prior to Admission medications   Medication Sig Start Date End Date Taking? Authorizing Provider  albuterol (PROVENTIL HFA;VENTOLIN HFA) 108 (90 Base) MCG/ACT inhaler Inhale 2 puffs into the lungs every 4 (four) hours as needed for wheezing or shortness of breath (cough, shortness of breath or wheezing.). 08/10/17   Elvina SidleLauenstein, Kurt, MD  aspirin EC 81 MG EC tablet Take 1 tablet (81 mg total) by  mouth daily. 05/03/12   Kristie CowmanSchooler, Karen, MD  azithromycin (ZITHROMAX) 250 MG tablet Take 1 tablet (250 mg total) by mouth daily. Take first 2 tablets together, then 1 every day until finished. 09/01/18   Wurst, GrenadaBrittany, PA-C  celecoxib (CELEBREX) 100 MG capsule Take 1 capsule (100 mg total) by mouth 2 (two) times daily. 01/08/19   Dione BoozeGlick, David, MD  HYDROcodone-acetaminophen (NORCO) 5-325 MG tablet Take 1 tablet by mouth every 4 (four) hours as needed for moderate pain. 01/08/19   Dione BoozeGlick, David, MD  meclizine (ANTIVERT) 25 MG tablet Take 1 tablet (25 mg total) by mouth 3  (three) times daily as needed for dizziness. 03/15/19   Joshua Zeringue A, PA-C  methocarbamol (ROBAXIN) 500 MG tablet Take 1 tablet (500 mg total) by mouth 2 (two) times daily. 03/15/19   Raylon Lamson A, PA-C  metoprolol tartrate (LOPRESSOR) 25 MG tablet TAKE 1/2 TABLET(12.5 MG) BY MOUTH TWICE DAILY 03/01/19   Mardella LaymanHagler, Brian, MD  promethazine-codeine [facility](PHENERGAN WITH CODEINE) 6.25-10 MG/5ML syrup Take 5 mLs by mouth at bedtime as needed for cough. 09/28/18   Bing NeighborsHarris, Kimberly S, FNP    Family History Family History  Problem Relation Age of Onset   Cancer Father        colon and later "throat;" in remission    Social History Social History   Tobacco Use   Smoking status: Never Smoker   Smokeless tobacco: Never Used  Substance Use Topics   Alcohol use: Yes    Alcohol/week: 2.0 standard drinks    Types: 2 Glasses of wine per week    Comment: occassional   Drug use: No     Allergies   Percocet [oxycodone-acetaminophen]   Review of Systems Review of Systems  Constitutional: Negative for activity change, chills and fever.  HENT: Negative for congestion, ear pain, facial swelling, postnasal drip, sinus pressure, sinus pain, sore throat and voice change.   Eyes: Positive for visual disturbance (blurred).  Respiratory: Negative for apnea, cough, shortness of breath and wheezing.   Cardiovascular: Negative for chest pain.  Gastrointestinal: Positive for vomiting. Negative for abdominal pain, diarrhea and nausea.  Genitourinary: Negative for dysuria and hematuria.  Musculoskeletal: Positive for arthralgias, back pain, gait problem, myalgias and neck pain. Negative for joint swelling and neck stiffness.  Skin: Negative for rash.  Allergic/Immunologic: Negative for immunocompromised state.  Neurological: Positive for dizziness and headaches. Negative for seizures, syncope, speech difficulty and weakness.  Psychiatric/Behavioral: Negative for confusion.     Physical Exam Updated Vital  Signs BP (!) 145/86    Pulse 80    Temp 98.9 F (37.2 C) (Oral)    Resp 18    SpO2 97%   Physical Exam Vitals signs and nursing note reviewed.  Constitutional:      General: She is not in acute distress.    Appearance: She is obese. She is not ill-appearing, toxic-appearing or diaphoretic.  HENT:     Head: Normocephalic and atraumatic.     Comments: Posterior scalp is atraumatic.    Nose: Nose normal.     Mouth/Throat:     Mouth: Mucous membranes are moist.  Eyes:     General: No scleral icterus.    Conjunctiva/sclera: Conjunctivae normal.     Pupils: Pupils are equal, round, and reactive to light.  Neck:     Musculoskeletal: Neck supple. Muscular tenderness present. No neck rigidity.     Vascular: No carotid bruit.  Cardiovascular:     Rate and Rhythm: Normal rate  and regular rhythm.     Pulses: Normal pulses.     Heart sounds: Normal heart sounds. No murmur. No friction rub. No gallop.   Pulmonary:     Effort: Pulmonary effort is normal. No respiratory distress.     Breath sounds: No stridor. No wheezing, rhonchi or rales.  Chest:     Chest wall: No tenderness.  Abdominal:     General: There is no distension.     Palpations: Abdomen is soft. There is no mass.     Tenderness: There is no right CVA tenderness, left CVA tenderness, guarding or rebound.     Hernia: No hernia is present.  Musculoskeletal:     Right lower leg: No edema.     Left lower leg: No edema.     Comments: Tender palpation over C7.  Full active and passive range of motion of the cervical spine.  No crepitus or step-offs.  She is diffusely tender to palpation to the spinous processes of the thoracic and lumbar spine without focal tenderness.  No paraspinal muscle tenderness.  Diffusely tender to palpation over the right SI joint and right anterolateral hip.  Full active and passive range of motion of the bilateral hips, knees, and ankles.  Lymphadenopathy:     Cervical: No cervical adenopathy.  Skin:     General: Skin is warm.     Findings: No rash.  Neurological:     Mental Status: She is alert.     Comments: Alert and oriented x3. Cranial nerves II through XII are grossly intact. 5/5 strength against resistance of the bilateral upper and lower extremities. Sensation is intact and equal throughout. Able to bear weight on the bilateral lower extremities and ambulatory with antalgic gait Negative Romberg.  No pronator drift. Negative heel-to-shin test. She has some dysmetria with finger-to-nose but does seem worse on the left.  Exam has been repeated multiple times and has been inconsistent.  Psychiatric:        Behavior: Behavior normal.    ED Treatments / Results  Labs (all labs ordered are listed, but only abnormal results are displayed) Labs Reviewed  CBC - Abnormal; Notable for the following components:      Result Value   RDW 15.6 (*)    All other components within normal limits  BASIC METABOLIC PANEL    EKG None  Radiology Ct Angio Head W Or Wo Contrast  Result Date: 03/15/2019 CLINICAL DATA:  Initial evaluation for acute dizziness, nausea, recent trauma. EXAM: CT ANGIOGRAPHY HEAD AND NECK TECHNIQUE: Multidetector CT imaging of the head and neck was performed using the standard protocol during bolus administration of intravenous contrast. Multiplanar CT image reconstructions and MIPs were obtained to evaluate the vascular anatomy. Carotid stenosis measurements (when applicable) are obtained utilizing NASCET criteria, using the distal internal carotid diameter as the denominator. CONTRAST:  80mL OMNIPAQUE IOHEXOL 350 MG/ML SOLN COMPARISON:  None. FINDINGS: CT HEAD FINDINGS Brain: Cerebral volume within normal limits for patient age. Mild chronic microvascular ischemic disease noted within the supratentorial cerebral white matter. No evidence for acute intracranial hemorrhage. No findings to suggest acute large vessel territory infarct. No mass lesion, midline shift, or mass  effect. Ventricles are normal in size without evidence for hydrocephalus. No extra-axial fluid collection identified. Vascular: No hyperdense vessel identified.Scattered vascular calcifications noted within the carotid siphons. Skull: Scalp soft tissues demonstrate no acute abnormality. Calvarium intact. Sinuses/Orbits: Globes and orbital soft tissues within normal limits. Visualized paranasal sinuses are clear. No mastoid  effusion. CTA NECK FINDINGS Aortic arch: Visualized aortic arch of normal caliber with normal branch pattern. No hemodynamically significant stenosis seen about the origin of the great vessels. Visualized subclavian arteries widely patent. Right carotid system: Right common and internal carotid arteries widely patent without stenosis, dissection, or occlusion. Left carotid system: Left common and internal carotid arteries widely patent without stenosis, dissection, or occlusion. Vertebral arteries: Both vertebral arteries arise from the subclavian arteries. Vertebral arteries widely patent within the neck without stenosis, dissection, or occlusion. Skeleton: No acute osseous finding. No discrete lytic or blastic osseous lesions. Other neck: No other acute soft tissue abnormality within the neck. Few scattered subcentimeter nodules noted within the right thyroid lobe, of doubtful significance given size. Upper chest: Visualized upper chest demonstrates no acute finding. Azygos lobe noted. Review of the MIP images confirms the above findings CTA HEAD FINDINGS Anterior circulation: Internal carotid arteries widely patent to the termini without stenosis or other abnormality. A1 segments, anterior communicating artery common anterior cerebral arteries widely patent. No M1 stenosis or occlusion. Negative MCA bifurcations. Distal MCA branches well perfused and symmetric. Posterior circulation: Vertebral arteries widely patent to the vertebrobasilar junction without stenosis. Posterior inferior cerebral  arteries patent bilaterally. Basilar widely patent to its distal aspect without stenosis. Superior cerebellar and posterior cerebral arteries widely patent bilaterally. Venous sinuses: Patent. Anatomic variants: None significant. No intracranial aneurysm. Review of the MIP images confirms the above findings IMPRESSION: Normal CTA of the head and neck. No acute traumatic vascular injury related to recent trauma. No large vessel occlusion or hemodynamically significant stenosis. Electronically Signed   By: Jeannine Boga M.D.   On: 03/15/2019 02:39   Dg Thoracic Spine 2 View  Result Date: 03/14/2019 CLINICAL DATA:  63 year old female with fall and back pain. EXAM: LUMBAR SPINE - COMPLETE 4+ VIEW; THORACIC SPINE 2 VIEWS COMPARISON:  Thoracic spine radiograph dated 09/16/2010 FINDINGS: There is no acute fracture or subluxation of the thoracic or lumbar spine. There multilevel degenerative changes with osteophyte. The visualized posterior elements appear intact. The soft tissues are grossly unremarkable. IMPRESSION: No acute/traumatic thoracic or lumbar spine pathology. Electronically Signed   By: Anner Crete M.D.   On: 03/14/2019 23:27   Dg Lumbar Spine Complete  Result Date: 03/14/2019 CLINICAL DATA:  63 year old female with fall and back pain. EXAM: LUMBAR SPINE - COMPLETE 4+ VIEW; THORACIC SPINE 2 VIEWS COMPARISON:  Thoracic spine radiograph dated 09/16/2010 FINDINGS: There is no acute fracture or subluxation of the thoracic or lumbar spine. There multilevel degenerative changes with osteophyte. The visualized posterior elements appear intact. The soft tissues are grossly unremarkable. IMPRESSION: No acute/traumatic thoracic or lumbar spine pathology. Electronically Signed   By: Anner Crete M.D.   On: 03/14/2019 23:27   Ct Angio Neck W And/or Wo Contrast  Result Date: 03/15/2019 CLINICAL DATA:  Initial evaluation for acute dizziness, nausea, recent trauma. EXAM: CT ANGIOGRAPHY HEAD AND  NECK TECHNIQUE: Multidetector CT imaging of the head and neck was performed using the standard protocol during bolus administration of intravenous contrast. Multiplanar CT image reconstructions and MIPs were obtained to evaluate the vascular anatomy. Carotid stenosis measurements (when applicable) are obtained utilizing NASCET criteria, using the distal internal carotid diameter as the denominator. CONTRAST:  28mL OMNIPAQUE IOHEXOL 350 MG/ML SOLN COMPARISON:  None. FINDINGS: CT HEAD FINDINGS Brain: Cerebral volume within normal limits for patient age. Mild chronic microvascular ischemic disease noted within the supratentorial cerebral white matter. No evidence for acute intracranial hemorrhage. No findings to suggest  acute large vessel territory infarct. No mass lesion, midline shift, or mass effect. Ventricles are normal in size without evidence for hydrocephalus. No extra-axial fluid collection identified. Vascular: No hyperdense vessel identified.Scattered vascular calcifications noted within the carotid siphons. Skull: Scalp soft tissues demonstrate no acute abnormality. Calvarium intact. Sinuses/Orbits: Globes and orbital soft tissues within normal limits. Visualized paranasal sinuses are clear. No mastoid effusion. CTA NECK FINDINGS Aortic arch: Visualized aortic arch of normal caliber with normal branch pattern. No hemodynamically significant stenosis seen about the origin of the great vessels. Visualized subclavian arteries widely patent. Right carotid system: Right common and internal carotid arteries widely patent without stenosis, dissection, or occlusion. Left carotid system: Left common and internal carotid arteries widely patent without stenosis, dissection, or occlusion. Vertebral arteries: Both vertebral arteries arise from the subclavian arteries. Vertebral arteries widely patent within the neck without stenosis, dissection, or occlusion. Skeleton: No acute osseous finding. No discrete lytic or  blastic osseous lesions. Other neck: No other acute soft tissue abnormality within the neck. Few scattered subcentimeter nodules noted within the right thyroid lobe, of doubtful significance given size. Upper chest: Visualized upper chest demonstrates no acute finding. Azygos lobe noted. Review of the MIP images confirms the above findings CTA HEAD FINDINGS Anterior circulation: Internal carotid arteries widely patent to the termini without stenosis or other abnormality. A1 segments, anterior communicating artery common anterior cerebral arteries widely patent. No M1 stenosis or occlusion. Negative MCA bifurcations. Distal MCA branches well perfused and symmetric. Posterior circulation: Vertebral arteries widely patent to the vertebrobasilar junction without stenosis. Posterior inferior cerebral arteries patent bilaterally. Basilar widely patent to its distal aspect without stenosis. Superior cerebellar and posterior cerebral arteries widely patent bilaterally. Venous sinuses: Patent. Anatomic variants: None significant. No intracranial aneurysm. Review of the MIP images confirms the above findings IMPRESSION: Normal CTA of the head and neck. No acute traumatic vascular injury related to recent trauma. No large vessel occlusion or hemodynamically significant stenosis. Electronically Signed   By: Rise Mu M.D.   On: 03/15/2019 02:39   Dg Hip Unilat W Or Wo Pelvis 2-3 Views Right  Result Date: 03/14/2019 CLINICAL DATA:  Fall, right leg pain EXAM: DG HIP (WITH OR WITHOUT PELVIS) 2-3V RIGHT COMPARISON:  None. FINDINGS: Degenerative changes in the hips bilaterally with spurring and joint space narrowing. SI joints symmetric and unremarkable. No acute bony abnormality. Specifically, no fracture, subluxation, or dislocation. IMPRESSION: No acute bony abnormality. Electronically Signed   By: Charlett Nose M.D.   On: 03/14/2019 23:25    Procedures Procedures (including critical care time)  Medications  Ordered in ED Medications  fentaNYL (SUBLIMAZE) injection 100 mcg (100 mcg Intravenous Given 03/15/19 0002)  iohexol (OMNIPAQUE) 350 MG/ML injection 80 mL (80 mLs Intravenous Contrast Given 03/15/19 0108)     Initial Impression / Assessment and Plan / ED Course  I have reviewed the triage vital signs and the nursing notes.  Pertinent labs & imaging results that were available during my care of the patient were reviewed by me and considered in my medical decision making (see chart for details).        63 year old female with a h/o of DJD, chronic shoulder pain, HTN, obesity, cervical radiculopathy, and sleep apnea presenting after a fall 3 days ago.  He was attempting to step onto the third step of stepladder when she fell backwards on her bed and then somersaulted off and landed on her back in the floor.  There was no syncope, but she awoke  the next morning with constant room spinning dizziness, headache, had one episode of vomiting, and blurred vision.  She is required ambulation with a cane since the event due to feeling off balance, but also secondary to pain in her right hip and leg.  On exam, no consistent neurologic deficits.  Her only finding is intermittent dysmetria with finger-to-nose.  Given her symptoms, there is concern for cerebellar involvement.  However, she has negative Romberg and no pronator drift.  Dysmetria initially seems worse on the left, but after repeating the exam multiple times, results appearing consistent.  Will order CTA of the head and neck as well as imaging of her back and pelvis given mechanism of injury.  Fentanyl given for pain control.  CTA head and neck are unremarkable, specifically for fracture, CVA, or SAH.  Imaging of the pelvis and back are negative for fracture.  Reevaluation, pain is well controlled.  Suspect the patient's symptoms are secondary to postconcussive syndrome.  We will give the patient referral to the concussion clinic.  She is been given a  course of meclizine and advised Tylenol and ibuprofen along with muscle relaxers for pain control.  She is agreeable with this plan at this time.  She is hemodynamically stable and in no acute distress.  ER return precautions given.  Safe for discharge home with outpatient follow-up.  Final Clinical Impressions(s) / ED Diagnoses   Final diagnoses:  Fall, initial encounter  Post concussion syndrome    ED Discharge Orders         Ordered    meclizine (ANTIVERT) 25 MG tablet  3 times daily PRN     03/15/19 0322    methocarbamol (ROBAXIN) 500 MG tablet  2 times daily     03/15/19 0322           Johanna Stafford A, PA-C 03/15/19 8309    Geoffery Lyons, MD 03/15/19 1531

## 2019-03-15 ENCOUNTER — Emergency Department (HOSPITAL_COMMUNITY): Payer: Self-pay

## 2019-03-15 LAB — CBC
HCT: 42.8 % (ref 36.0–46.0)
Hemoglobin: 13.6 g/dL (ref 12.0–15.0)
MCH: 27.3 pg (ref 26.0–34.0)
MCHC: 31.8 g/dL (ref 30.0–36.0)
MCV: 85.8 fL (ref 80.0–100.0)
Platelets: 297 10*3/uL (ref 150–400)
RBC: 4.99 MIL/uL (ref 3.87–5.11)
RDW: 15.6 % — ABNORMAL HIGH (ref 11.5–15.5)
WBC: 10.4 10*3/uL (ref 4.0–10.5)
nRBC: 0 % (ref 0.0–0.2)

## 2019-03-15 LAB — BASIC METABOLIC PANEL
Anion gap: 7 (ref 5–15)
BUN: 15 mg/dL (ref 8–23)
CO2: 26 mmol/L (ref 22–32)
Calcium: 9.3 mg/dL (ref 8.9–10.3)
Chloride: 108 mmol/L (ref 98–111)
Creatinine, Ser: 0.72 mg/dL (ref 0.44–1.00)
GFR calc Af Amer: >60 mL/min (ref >60)
GFR calc non Af Amer: >60 mL/min (ref >60)
Glucose, Bld: 98 mg/dL (ref 70–99)
Potassium: 3.7 mmol/L (ref 3.5–5.1)
Sodium: 141 mmol/L (ref 135–145)

## 2019-03-15 MED ORDER — METHOCARBAMOL 500 MG PO TABS: 500.0000 mg | ORAL_TABLET | Freq: Two times a day (BID) | ORAL | 0 refills | Status: DC

## 2019-03-15 MED ORDER — IOHEXOL 350 MG/ML SOLN
80.0000 mL | Freq: Once | INTRAVENOUS | Status: AC | PRN
  Administered 2019-03-15: 80 mL via INTRAVENOUS

## 2019-03-15 MED ORDER — MECLIZINE HCL 25 MG PO TABS: 25.0000 mg | ORAL_TABLET | Freq: Three times a day (TID) | ORAL | 0 refills | Status: DC | PRN

## 2019-03-15 NOTE — Discharge Instructions (Signed)
Thank you for allowing me to care for you today in the Emergency Department.   Your symptoms are most most consistent with a concussion.  You can follow-up with the concussion clinic if your symptoms persist.  You can take 1 tablet of meclizine by mouth every 8 hours as needed for dizziness.  Do not take other medications that may make you sleepy or drowsy with this medication until you know how it impacts you.  Do not work or drive while taking this medication so you know how it impacts you.  Take 650 mg of Tylenol or 600 mg of ibuprofen with food every 6 hours for pain.  You can alternate between these 2 medications every 3 hours if your pain returns.  For instance, you can take Tylenol at noon, followed by a dose of ibuprofen at 3, followed by second dose of Tylenol and 6.  Did also take 1 tablet of Robaxin by mouth up to 2 times daily.  Do not work or drive while taking this medication or take with other sedating substances until you know how it impacts you because it may make you drowsy.  Apply ice or heat for 15 to 20 minutes as frequently as needed to areas that are sore.  Start to stretch the muscles of areas that are sore as your pain allows.  Return to the emergency department if you have any fall or injury, if you start having episodes where you pee or poop on yourself, if you develop facial drooping, slurred speech, or other new, concerning symptoms.

## 2019-03-15 NOTE — ED Notes (Signed)
Patient transported to CT 

## 2019-05-25 ENCOUNTER — Other Ambulatory Visit: Payer: Self-pay

## 2019-05-25 ENCOUNTER — Ambulatory Visit (HOSPITAL_COMMUNITY)
Admission: EM | Admit: 2019-05-25 | Discharge: 2019-05-25 | Disposition: A | Discharge status: Home / Self Care | Payer: Self-pay | Attending: Emergency Medicine | Admitting: Emergency Medicine

## 2019-05-25 ENCOUNTER — Encounter (HOSPITAL_COMMUNITY): Payer: Self-pay

## 2019-05-25 DIAGNOSIS — Z760 Encounter for issue of repeat prescription: Secondary | ICD-10-CM

## 2019-05-25 DIAGNOSIS — I10 Essential (primary) hypertension: Secondary | ICD-10-CM

## 2019-05-25 MED ORDER — METOPROLOL TARTRATE 25 MG PO TABS: ORAL_TABLET | ORAL | 1 refills | Status: DC

## 2019-05-25 NOTE — ED Triage Notes (Signed)
Pt states she needs her B/P meds refilled. Pt states she ran out yesterday.

## 2019-05-25 NOTE — ED Provider Notes (Signed)
HPI  SUBJECTIVE:  Michele Gross is a 63 y.o. female who presents for refill of her metoprolol.  States that she ran out yesterday.  She states it controls her blood pressure well.  She has no complaints.  No headache.  No chest pain, shortness of breath, headache, palpitations, seizure, syncope, lower extremity edema.  States that her hypercholesterolemia and GERD have resolved and does not need medications for this.  States that she preferred to come here rather than see her PMD due to crowding at PMDs office.  Is concerned about contracting coronavirus.  PMD: [facility]   Past Medical History:  Diagnosis Date  . Chest pain    Negative nuclear stress 2007, negative stress echo 2011, reported negative cath 2000; negative stress echo November 2013  . Chronic shoulder pain   . DJD (degenerative joint disease)   . GERD (gastroesophageal reflux disease)   . Hypertension   . Obesity   . Sleep apnea     Past Surgical History:  Procedure Laterality Date  . PARTIAL HYSTERECTOMY      Family History  Problem Relation Age of Onset  . Cancer Father        colon and later "throat;" in remission    Social History   Tobacco Use  . Smoking status: Never Smoker  . Smokeless tobacco: Never Used  Substance Use Topics  . Alcohol use: Yes    Alcohol/week: 2.0 standard drinks    Types: 2 Glasses of wine per week    Comment: occassional  . Drug use: No    No current facility-administered medications for this encounter.   Current Outpatient Medications:  .  albuterol (PROVENTIL HFA;VENTOLIN HFA) 108 (90 Base) MCG/ACT inhaler, Inhale 2 puffs into the lungs every 4 (four) hours as needed for wheezing or shortness of breath (cough, shortness of breath or wheezing.)., Disp: 1 Inhaler, Rfl: 1 .  aspirin EC 81 MG EC tablet, Take 1 tablet (81 mg total) by mouth daily., Disp: 30 tablet, Rfl: 1 .  celecoxib (CELEBREX) 100 MG capsule, Take 1 capsule (100 mg total) by  mouth 2 (two) times daily., Disp: 30 capsule, Rfl: 0 .  meclizine (ANTIVERT) 25 MG tablet, Take 1 tablet (25 mg total) by mouth 3 (three) times daily as needed for dizziness., Disp: 30 tablet, Rfl: 0 .  methocarbamol (ROBAXIN) 500 MG tablet, Take 1 tablet (500 mg total) by mouth 2 (two) times daily., Disp: 20 tablet, Rfl: 0 .  metoprolol tartrate (LOPRESSOR) 25 MG tablet, TAKE 1/2 TABLET(12.5 MG) BY MOUTH TWICE DAILY, Disp: 30 tablet, Rfl: 1 .  promethazine-codeine (PHENERGAN WITH CODEINE) 6.25-10 MG/5ML syrup, Take 5 mLs by mouth at bedtime as needed for cough., Disp: 120 mL, Rfl: 0  Allergies  Allergen Reactions  . Percocet [Oxycodone-Acetaminophen] Other (See Comments)    hallucinations     ROS  As noted in HPI.   Physical Exam  BP (!) 157/92 (BP Location: Right Arm)   Pulse 80   Temp 98.6 F (37 C) (Oral)   Resp 18   Wt 120.2 kg   SpO2 100%   BMI 46.21 kg/m   Constitutional: Well developed, well nourished, no acute distress Eyes:  EOMI, conjunctiva normal bilaterally HENT: Normocephalic, atraumatic,mucus membranes moist Respiratory: Normal inspiratory effort Cardiovascular: Normal rate GI: nondistended skin: No rash, skin intact Musculoskeletal: no deformities Neurologic: Alert & oriented x 3, no focal neuro deficits Psychiatric: Speech and behavior appropriate   ED Course   Medications -  No data to display  No orders of the defined types were placed in this encounter.   No results found for this or any previous visit (from the past 24 hour(s)). No results found.  ED Clinical Impression  1. Essential hypertension   2. Medication refill      ED Assessment/Plan  Patient has no complaints, states that she is doing well.  Will refill metoprolol 25 mg one half tab twice daily.  1 months worth with 1 refill in case she cannot be seen by her primary care physician.   Meds ordered this encounter  Medications  . metoprolol tartrate (LOPRESSOR) 25 MG tablet     Sig: TAKE 1/2 TABLET(12.5 MG) BY MOUTH TWICE DAILY    Dispense:  30 tablet    Refill:  1    *This clinic note was created using [employer]. Therefore, there may be occasional mistakes despite careful proofreading.   ?    Melynda Ripple, MD 05/25/19 [phone]

## 2019-07-04 ENCOUNTER — Other Ambulatory Visit: Payer: Self-pay

## 2019-07-04 ENCOUNTER — Emergency Department (HOSPITAL_COMMUNITY)
Admission: EM | Admit: 2019-07-04 | Discharge: 2019-07-04 | Disposition: A | Discharge status: Home / Self Care | Payer: Self-pay | Attending: Emergency Medicine | Admitting: Emergency Medicine

## 2019-07-04 ENCOUNTER — Encounter (HOSPITAL_COMMUNITY): Payer: Self-pay | Admitting: Emergency Medicine

## 2019-07-04 DIAGNOSIS — R4583 Excessive crying of child, adolescent or adult: Secondary | ICD-10-CM | POA: Insufficient documentation

## 2019-07-04 DIAGNOSIS — F4321 Adjustment disorder with depressed mood: Secondary | ICD-10-CM

## 2019-07-04 DIAGNOSIS — I10 Essential (primary) hypertension: Secondary | ICD-10-CM | POA: Insufficient documentation

## 2019-07-04 DIAGNOSIS — F4320 Adjustment disorder, unspecified: Secondary | ICD-10-CM | POA: Insufficient documentation

## 2019-07-04 DIAGNOSIS — Z7982 Long term (current) use of aspirin: Secondary | ICD-10-CM | POA: Insufficient documentation

## 2019-07-04 DIAGNOSIS — R4589 Other symptoms and signs involving emotional state: Secondary | ICD-10-CM

## 2019-07-04 DIAGNOSIS — Z79899 Other long term (current) drug therapy: Secondary | ICD-10-CM | POA: Insufficient documentation

## 2019-07-04 MED ORDER — LORAZEPAM 1 MG PO TABS
1.0000 mg | ORAL_TABLET | Freq: Once | ORAL | Status: AC
  Administered 2019-07-04: 1 mg via ORAL
  Filled 2019-07-04: qty 1

## 2019-07-04 MED ORDER — LORAZEPAM 1 MG PO TABS: 1.0000 mg | ORAL_TABLET | Freq: Three times a day (TID) | ORAL | 0 refills | Status: DC | PRN

## 2019-07-04 NOTE — ED Triage Notes (Signed)
Pt reports losing her husband and now her daughter came in as a code stroke today, feeling very stressed and feels like she is hyperventilating. Tearful in triage.

## 2019-07-04 NOTE — ED Notes (Signed)
Unable to obtain d/c VS at this time as pt is at daughters bedside.

## 2019-07-04 NOTE — ED Provider Notes (Signed)
[facility] EMERGENCY DEPARTMENT Provider Note   CSN: [id number] Arrival date & time: 07/04/19  1702     History No chief complaint on file.   Michele Gross is a 64 y.o. female with a past medical history significant for obesity, hypertension, and sleep apnea who presents to the ED due to feelings of severe stress and sadness.  Patient's daughter was brought in the ED earlier today and was found to be having a stroke. Patient was unaware of the daughter's status. She notes just prior to arrival, patient was at the grave site placing flowers on her husband's grave who died 1 year ago on this day. Patient also notes she lost her mom on the 10th and son on the 7th of this month on different years. Patient has no complaints of chest pain or shortness of breath. She is very tearful and keeps noting "this is too much for me".     Past Medical History:  Diagnosis Date  . Chest pain    Negative nuclear stress 2007, negative stress echo 2011, reported negative cath 2000; negative stress echo November 2013  . Chronic shoulder pain   . DJD (degenerative joint disease)   . GERD (gastroesophageal reflux disease)   . Hypertension   . Obesity   . Sleep apnea     Patient Active Problem List   Diagnosis Date Noted  . Cervical radiculopathy due to degenerative joint disease of spine 05/02/2012  . Chest pain, atypical 05/02/2012  . HYPERLIPIDEMIA 07/01/2007  . GERD 07/01/2007  . BACK PAIN 07/01/2007    Past Surgical History:  Procedure Laterality Date  . PARTIAL HYSTERECTOMY       OB History   No obstetric history on file.     Family History  Problem Relation Age of Onset  . Cancer Father        colon and later "throat;" in remission    Social History   Tobacco Use  . Smoking status: Never Smoker  . Smokeless tobacco: Never Used  Substance Use Topics  . Alcohol use: Yes    Alcohol/week: 2.0 standard drinks    Types: 2 Glasses of wine per week   Comment: occassional  . Drug use: No    Home Medications Prior to Admission medications   Medication Sig Start Date End Date Taking? Authorizing Provider  albuterol (PROVENTIL HFA;VENTOLIN HFA) 108 (90 Base) MCG/ACT inhaler Inhale 2 puffs into the lungs every 4 (four) hours as needed for wheezing or shortness of breath (cough, shortness of breath or wheezing.). 08/10/17   Elvina Sidle, MD  aspirin EC 81 MG EC tablet Take 1 tablet (81 mg total) by mouth daily. 05/03/12   Kristie Cowman, MD  celecoxib (CELEBREX) 100 MG capsule Take 1 capsule (100 mg total) by mouth 2 (two) times daily. 01/08/19   Dione Booze, MD  LORazepam (ATIVAN) 1 MG tablet Take 1 tablet (1 mg total) by mouth 3 (three) times daily as needed for anxiety. 07/04/19   Mannie Stabile, PA-C  meclizine (ANTIVERT) 25 MG tablet Take 1 tablet (25 mg total) by mouth 3 (three) times daily as needed for dizziness. 03/15/19   McDonald, Mia A, PA-C  methocarbamol (ROBAXIN) 500 MG tablet Take 1 tablet (500 mg total) by mouth 2 (two) times daily. 03/15/19   McDonald, Mia A, PA-C  metoprolol tartrate (LOPRESSOR) 25 MG tablet TAKE 1/2 TABLET(12.5 MG) BY MOUTH TWICE DAILY 05/25/19   Domenick Gong, MD  promethazine-codeine [facility] WITH CODEINE) 6.25-10  MG/5ML syrup Take 5 mLs by mouth at bedtime as needed for cough. 09/28/18   Bing Neighbors, FNP    Allergies    Percocet [oxycodone-acetaminophen]  Review of Systems   Review of Systems  Constitutional: Negative for chills and fever.  Respiratory: Negative for shortness of breath.   Cardiovascular: Negative for chest pain.  Gastrointestinal: Negative for abdominal pain, diarrhea, nausea and vomiting.    Physical Exam Updated Vital Signs BP (!) 161/109   Pulse (!) 101   Temp 99.2 F (37.3 C) (Oral)   Resp 20   SpO2 98%   Physical Exam Vitals and nursing note reviewed.  Constitutional:      General: She is not in acute distress.    Comments: Very tearful on exam    HENT:     Head: Normocephalic.  Eyes:     Conjunctiva/sclera: Conjunctivae normal.  Cardiovascular:     Rate and Rhythm: Regular rhythm. Tachycardia present.     Pulses: Normal pulses.     Heart sounds: Normal heart sounds. No murmur. No friction rub. No gallop.   Pulmonary:     Effort: Pulmonary effort is normal.     Breath sounds: Normal breath sounds.     Comments: Strict ED precautions discussed with patient. Patient states understanding and agrees to plan. Patient discharged home in no acute distress and stable vitals  Abdominal:     General: Abdomen is flat. There is no distension.     Palpations: Abdomen is soft.     Tenderness: There is no abdominal tenderness. There is no guarding or rebound.  Musculoskeletal:     Cervical back: Neck supple.     Comments: Able to move all 4 extremities without difficulty. No lower extremity edema.   Skin:    General: Skin is warm.  Neurological:     General: No focal deficit present.     Mental Status: She is alert.  Psychiatric:        Mood and Affect: Affect is tearful.     ED Results / Procedures / Treatments   Labs (all labs ordered are listed, but only abnormal results are displayed) Labs Reviewed - No data to display  EKG None  Radiology No results found.  Procedures Procedures (including critical care time)  Medications Ordered in ED Medications  LORazepam (ATIVAN) tablet 1 mg (1 mg Oral Given 07/04/19 1747)    ED Course  I have reviewed the triage vital signs and the nursing notes.  Pertinent labs & imaging results that were available during my care of the patient were reviewed by me and considered in my medical decision making (see chart for details).  Clinical Course as of Jul 03 1854  Mon Jul 04, 2019  1837 Spoke to [place] with case management about possible grief counseling given patient's tremendous amount of losses over the past few years.    [CA]    Clinical Course User Index [CA] Mannie Stabile, PA-C   MDM Rules/Calculators/A&P                      64 year old female presents to the ED due to hyperventilation secondary to her daughter being broke to the ED status post stroke. Patient was unaware of daughter's status prior to coming to the ED. The attending that is taking care of patient's daughter and neurosurgeon discussed results with patient. Patient has had many losses over the past years in January. Patient has no other  complaints at this time. Patient is tachycardic at 101, but otherwise reassuring vitals. Patient very tearful on exam. Ativan given to patient which improved her symptoms. Family member at bedside with patient. Case management consulted for community resources and grief management. Patient brought to bedside of daughter.  Will discharge patient with a few ativans and community resources.  [place] drug database reviewed prior to discharge. Strict ED precautions discussed with patient. Patient states understanding and agrees to plan. Patient discharged home in no acute distress and stable vitals.   Final Clinical Impression(s) / ED Diagnoses Final diagnoses:  Tearfulness  Grief reaction    Rx / DC Orders ED Discharge Orders         Ordered    LORazepam (ATIVAN) 1 MG tablet  3 times daily PRN     07/04/19 1855           Suzy Bouchard, PA-C 07/04/19 1920    Margette Fast, MD 07/05/19 1344

## 2019-07-04 NOTE — ED Notes (Signed)
Discharge instructions and prescriptions discussed with Pt. Pt verbalized understanding. Pt stable and ambulatory, leaving with family.

## 2019-07-04 NOTE — Discharge Instructions (Signed)
As discussed, I have given you a few tablets for Ativan which is an anxiety medication. Do not mix with other over the counter medications. I recommend calling some of the resources that case management discussed with you tomorrow to schedule some appointments. Follow-up with your PCP within the next week if symptoms do not improve. Return to the ER for new or worsening symptoms. I am thinking about you and your family!

## 2019-07-04 NOTE — Progress Notes (Signed)
This patient is mother of another patient in the Emergency Dept. Her daughter came in ED per ambulance and this upset this patient she was quite distressed.  Chaplain listened as she shared other family members had died on this same day a year ago.  She was very concerned for her daughter and wanted to see her but because she was herself a patient, this was not allowed at the time I was there.  She asked me to see her daughter and have prayer over her and come back so she would know.  The goal was to calm this patient down so she didn't have health issues escalate. Had prayer and shared scripture and encouragaed deep breathing and she calmed down.  I went to see her daughter with the same name Roxene. and was able to come back and tell her I prayed with her daughter.  That seemed to calm her a lot and then she was getting reports from other staff about her daughters condition.  I went to work with the family to help with coordinating family members coming to the room.Phebe Colla, Chaplain    07/04/19 2000  Clinical Encounter Type  Visited With Patient;Other (Comment)  Visit Type Psychological support;Spiritual support  Referral From Other (Comment) ([facility]gendy Dept)  Consult/Referral To Chaplain  Spiritual Encounters  Spiritual Needs Sacred text;Prayer;Emotional  Stress Factors  Patient Stress Factors Family relationships;Health changes;Other (Comment)  Family Stress Factors Other (Comment)

## 2019-07-04 NOTE — Social Work (Addendum)
EDCSW added grief counseling resources to AVS.  CSW waiting to speak with PT at later time.  CSW met with PT just prior to discharge in order to give her resources in person.

## 2019-08-05 ENCOUNTER — Other Ambulatory Visit: Payer: Self-pay

## 2019-08-05 ENCOUNTER — Encounter (HOSPITAL_COMMUNITY): Payer: Self-pay

## 2019-08-05 ENCOUNTER — Ambulatory Visit (HOSPITAL_COMMUNITY)
Admission: EM | Admit: 2019-08-05 | Discharge: 2019-08-05 | Disposition: A | Discharge status: Home / Self Care | Payer: Self-pay | Attending: Physician Assistant | Admitting: Physician Assistant

## 2019-08-05 DIAGNOSIS — I10 Essential (primary) hypertension: Secondary | ICD-10-CM

## 2019-08-05 DIAGNOSIS — Z760 Encounter for issue of repeat prescription: Secondary | ICD-10-CM

## 2019-08-05 DIAGNOSIS — F4321 Adjustment disorder with depressed mood: Secondary | ICD-10-CM

## 2019-08-05 MED ORDER — METOPROLOL TARTRATE 25 MG PO TABS: ORAL_TABLET | ORAL | 1 refills | Status: DC

## 2019-08-05 NOTE — ED Triage Notes (Signed)
Pt presents with complaints of needing a refill on blood pressure medication. Reports she has not been able to get a primary care physician. States that she now has medicaid and can get one. Reports a lot of stress at home. Her daughter and son and law both just passed away and she has been out of her medication for a couple days. Denies any symptoms at this time.

## 2019-08-05 NOTE — Discharge Instructions (Signed)
I am very sorry for your recent loss. Call the behavioral health number to discuss local resources.   I have refilled your blood pressure medication, resume taking this as 1/2 tablet 2 times a day.  Please reach out to the supplied clinic to discuss primary care needs

## 2019-08-05 NOTE — ED Provider Notes (Signed)
[facility]    CSN: [id number] Arrival date & time: 08/05/19  0932      History   Chief Complaint Chief Complaint  Patient presents with  . Hypertension    HPI Michele Gross is a 64 y.o. female.   Patient reports to urgent care today for blood pressure medication refill. She reports running out of her metoprolol 2 days ago. She has been well maintained on her 1/2 tablet twice a day. She has not had primary care in some time due to insurance reasons but has recently qualified for medicaid and is going to seek primary care. She denies chest pain, shortness of breath. She does endorse some fluid in her legs at the end of the day but this goes away by morning.   She also reports her daughter passed away recent on 2022-07-09 and she has been grieving. She reports no thoughts of self harm. She is seeking grievance counseling on her own currently.      Past Medical History:  Diagnosis Date  . Chest pain    Negative nuclear stress 2007, negative stress echo 2011, reported negative cath 2000; negative stress echo November 2013  . Chronic shoulder pain   . DJD (degenerative joint disease)   . GERD (gastroesophageal reflux disease)   . Hypertension   . Obesity   . Sleep apnea     Patient Active Problem List   Diagnosis Date Noted  . Cervical radiculopathy due to degenerative joint disease of spine 05/02/2012  . Chest pain, atypical 05/02/2012  . HYPERLIPIDEMIA 07/01/2007  . GERD 07/01/2007  . BACK PAIN 07/01/2007    Past Surgical History:  Procedure Laterality Date  . PARTIAL HYSTERECTOMY      OB History   No obstetric history on file.      Home Medications    Prior to Admission medications   Medication Sig Start Date End Date Taking? Authorizing Provider  albuterol (PROVENTIL HFA;VENTOLIN HFA) 108 (90 Base) MCG/ACT inhaler Inhale 2 puffs into the lungs every 4 (four) hours as needed for wheezing or shortness of breath (cough, shortness of breath or  wheezing.). 08/10/17   Robyn Haber, MD  aspirin EC 81 MG EC tablet Take 1 tablet (81 mg total) by mouth daily. 05/03/12   Dorian Heckle, MD  celecoxib (CELEBREX) 100 MG capsule Take 1 capsule (100 mg total) by mouth 2 (two) times daily. 09/30/79   Delora Fuel, MD  LORazepam (ATIVAN) 1 MG tablet Take 1 tablet (1 mg total) by mouth 3 (three) times daily as needed for anxiety. 07/10/19   Suzy Bouchard, PA-C  metoprolol tartrate (LOPRESSOR) 25 MG tablet TAKE 1/2 TABLET(12.5 MG) BY MOUTH TWICE DAILY 08/05/19   Kam Kushnir, Marguerita Beards, PA-C    Family History Family History  Problem Relation Age of Onset  . Cancer Father        colon and later "throat;" in remission    Social History Social History   Tobacco Use  . Smoking status: Never Smoker  . Smokeless tobacco: Never Used  Substance Use Topics  . Alcohol use: Yes    Alcohol/week: 2.0 standard drinks    Types: 2 Glasses of wine per week    Comment: occassional  . Drug use: No     Allergies   Percocet [oxycodone-acetaminophen]   Review of Systems Review of Systems  Constitutional: Negative for chills and fever.  Eyes: Negative for pain and visual disturbance.  Respiratory: Negative for cough, chest tightness and shortness of  breath.   Cardiovascular: Positive for leg swelling (bilateral in the evenings). Negative for chest pain and palpitations.  Gastrointestinal: Negative for abdominal pain and vomiting.  Musculoskeletal: Negative for arthralgias, back pain and myalgias.  Skin: Negative for color change and rash.  Neurological: Negative for dizziness, seizures, syncope, weakness and headaches.  All other systems reviewed and are negative.    Physical Exam Triage Vital Signs ED Triage Vitals  Enc Vitals Group     BP 08/05/19 1038 (!) 148/91     Pulse Rate 08/05/19 1038 70     Resp 08/05/19 1038 18     Temp 08/05/19 1038 98.1 F (36.7 C)     Temp src --      SpO2 08/05/19 1038 97 %     Weight --      Height --       Head Circumference --      Peak Flow --      Pain Score 08/05/19 1037 0     Pain Loc --      Pain Edu? --      Excl. in GC? --    No data found.  Updated Vital Signs BP (!) 148/91   Pulse 70   Temp 98.1 F (36.7 C)   Resp 18   SpO2 97%   Visual Acuity Right Eye Distance:   Left Eye Distance:   Bilateral Distance:    Right Eye Near:   Left Eye Near:    Bilateral Near:     Physical Exam Vitals and nursing note reviewed.  Constitutional:      General: She is not in acute distress.    Appearance: Normal appearance. She is well-developed. She is not ill-appearing.     Comments: Briefly tearful when discussing recent family loss  HENT:     Head: Normocephalic and atraumatic.  Eyes:     General: No scleral icterus.    Conjunctiva/sclera: Conjunctivae normal.     Pupils: Pupils are equal, round, and reactive to light.  Cardiovascular:     Rate and Rhythm: Normal rate and regular rhythm.     Heart sounds: No murmur.  Pulmonary:     Effort: Pulmonary effort is normal. No respiratory distress.     Breath sounds: Normal breath sounds.  Abdominal:     Palpations: Abdomen is soft.     Tenderness: There is no abdominal tenderness.  Musculoskeletal:     Cervical back: Neck supple.     Right lower leg: No edema.     Left lower leg: No edema.  Skin:    General: Skin is warm and dry.  Neurological:     General: No focal deficit present.     Mental Status: She is alert and oriented to person, place, and time.  Psychiatric:        Mood and Affect: Mood normal.        Behavior: Behavior normal.        Thought Content: Thought content normal.        Judgment: Judgment normal.      UC Treatments / Results  Labs (all labs ordered are listed, but only abnormal results are displayed) Labs Reviewed - No data to display  EKG   Radiology No results found.  Procedures Procedures (including critical care time)  Medications Ordered in UC Medications - No data to  display  Initial Impression / Assessment and Plan / UC Course  I have reviewed the triage vital signs and the  nursing notes.  Pertinent labs & imaging results that were available during my care of the patient were reviewed by me and considered in my medical decision making (see chart for details).     #Hypertension Considering 2 days off medicaiton, BP at 148/91 is good. Refilled metoprolol and PCP resources given  #Grief Patient obviously going through grief phases and is seeking care. Discussed some resources and she is confident in the "Hospice" resources she has found. No SI, discussed what to do if feeling more depressed and she assures she will call or come in.   Final Clinical Impressions(s) / UC Diagnoses   Final diagnoses:  Essential hypertension  Medication refill  Grief     Discharge Instructions     I am very sorry for your recent loss. Call the behavioral health number to discuss local resources.   I have refilled your blood pressure medication, resume taking this as 1/2 tablet 2 times a day.  Please reach out to the supplied clinic to discuss primary care needs      ED Prescriptions    Medication Sig Dispense Auth. Provider   metoprolol tartrate (LOPRESSOR) 25 MG tablet TAKE 1/2 TABLET(12.5 MG) BY MOUTH TWICE DAILY 30 tablet Deshan Hemmelgarn, Veryl Speak, PA-C     PDMP not reviewed this encounter.   Hermelinda Medicus, PA-C 08/05/19 1112

## 2019-10-29 ENCOUNTER — Encounter (HOSPITAL_COMMUNITY): Payer: Self-pay

## 2019-10-29 ENCOUNTER — Other Ambulatory Visit: Payer: Self-pay

## 2019-10-29 ENCOUNTER — Ambulatory Visit (HOSPITAL_COMMUNITY)
Admission: EM | Admit: 2019-10-29 | Discharge: 2019-10-29 | Disposition: A | Discharge status: Home / Self Care | Payer: Medicaid Other | Attending: Physician Assistant | Admitting: Physician Assistant

## 2019-10-29 DIAGNOSIS — Z760 Encounter for issue of repeat prescription: Secondary | ICD-10-CM

## 2019-10-29 DIAGNOSIS — L84 Corns and callosities: Secondary | ICD-10-CM

## 2019-10-29 DIAGNOSIS — I10 Essential (primary) hypertension: Secondary | ICD-10-CM

## 2019-10-29 MED ORDER — METOPROLOL TARTRATE 25 MG PO TABS: 12.5000 mg | ORAL_TABLET | Freq: Two times a day (BID) | ORAL | 1 refills | Status: DC

## 2019-10-29 MED ORDER — LIDOCAINE 5 % EX OINT: 1.0000 "application " | TOPICAL_OINTMENT | CUTANEOUS | 0 refills | Status: DC | PRN

## 2019-10-29 NOTE — ED Triage Notes (Signed)
Pt reports pain in the base of the left big toe x 1 month. Pain is worse when pt uses shoes. Pt bough a bigger shoe size without improvement of the pain.

## 2019-10-29 NOTE — Discharge Instructions (Addendum)
You may try the lidocaine ointment on the areas that hurt.  Would also like for you to pat the areas or wear open toed shoes to avoid friction.  I have refilled your blood pressure medication, continue this as prescribed.  [facility]) 4.7   (106)  Podiatrist [address] [employer]  [phone]

## 2019-10-29 NOTE — ED Provider Notes (Signed)
[facility]    CSN: [id number] Arrival date & time: 10/29/19  1358      History   Chief Complaint Chief Complaint  Patient presents with  . Foot Pain    HPI Michele Gross is a 64 y.o. female.   Patient reports evaluation of painful corns on her left big toe and fifth toe.  She reports these been present for quite a while.  She reports big toe corn is become quite painful recently.  She reports it is very painful to wear shoes or something touches the corn directly.  She reports it does not bother her much of nothing touches it and she is wearing open toed shoes.  She has been trying at home treatments for the corns.  He has not noted any erythema or significant swelling around the corns.  She has not seen a podiatrist for these or another provider to evaluate them.   Patient also reports she ran out of her metoprolol blood pressure medicine yesterday.  She is still in the process of establishing a primary care provider.  Reports good compliance and monitoring her blood pressure at home.  Denies any chest pain or shortness of breath.       Past Medical History:  Diagnosis Date  . Chest pain    Negative nuclear stress 2007, negative stress echo 2011, reported negative cath 2000; negative stress echo November 2013  . Chronic shoulder pain   . DJD (degenerative joint disease)   . GERD (gastroesophageal reflux disease)   . Hypertension   . Obesity   . Sleep apnea     Patient Active Problem List   Diagnosis Date Noted  . Cervical radiculopathy due to degenerative joint disease of spine 05/02/2012  . Chest pain, atypical 05/02/2012  . HYPERLIPIDEMIA 07/01/2007  . GERD 07/01/2007  . BACK PAIN 07/01/2007    Past Surgical History:  Procedure Laterality Date  . PARTIAL HYSTERECTOMY      OB History   No obstetric history on file.      Home Medications    Prior to Admission medications   Medication Sig Start Date End Date Taking? Authorizing Provider    albuterol (PROVENTIL HFA;VENTOLIN HFA) 108 (90 Base) MCG/ACT inhaler Inhale 2 puffs into the lungs every 4 (four) hours as needed for wheezing or shortness of breath (cough, shortness of breath or wheezing.). 08/10/17   Elvina Sidle, MD  aspirin EC 81 MG EC tablet Take 1 tablet (81 mg total) by mouth daily. 05/03/12   Kristie Cowman, MD  celecoxib (CELEBREX) 100 MG capsule Take 1 capsule (100 mg total) by mouth 2 (two) times daily. 01/08/19   Dione Booze, MD  lidocaine (XYLOCAINE) 5 % ointment Apply 1 application topically as needed. 10/29/19   Natanael Saladin, Veryl Speak, PA-C  LORazepam (ATIVAN) 1 MG tablet Take 1 tablet (1 mg total) by mouth 3 (three) times daily as needed for anxiety. 07/04/19   Mannie Stabile, PA-C  metoprolol tartrate (LOPRESSOR) 25 MG tablet Take 0.5 tablets (12.5 mg total) by mouth 2 (two) times daily. TAKE 1/2 TABLET(12.5 MG) BY MOUTH TWICE DAILY 10/29/19 12/28/19  Kirt Chew, Veryl Speak, PA-C    Family History Family History  Problem Relation Age of Onset  . Cancer Father        colon and later "throat;" in remission    Social History Social History   Tobacco Use  . Smoking status: Never Smoker  . Smokeless tobacco: Never Used  Substance Use Topics  .  Alcohol use: Yes    Alcohol/week: 2.0 standard drinks    Types: 2 Glasses of wine per week    Comment: occassional  . Drug use: No     Allergies   Percocet [oxycodone-acetaminophen]   Review of Systems Review of Systems  Per HPI Physical Exam Triage Vital Signs ED Triage Vitals  Enc Vitals Group     BP 10/29/19 1419 135/74     Pulse Rate 10/29/19 1419 73     Resp 10/29/19 1419 18     Temp 10/29/19 1419 98.5 F (36.9 C)     Temp Source 10/29/19 1419 Oral     SpO2 10/29/19 1419 99 %     Weight --      Height --      Head Circumference --      Peak Flow --      Pain Score 10/29/19 1418 8     Pain Loc --      Pain Edu? --      Excl. in GC? --    No data found.  Updated Vital Signs BP 135/74 (BP Location:  Right Arm)   Pulse 73   Temp 98.5 F (36.9 C) (Oral)   Resp 18   SpO2 99%   Visual Acuity Right Eye Distance:   Left Eye Distance:   Bilateral Distance:    Right Eye Near:   Left Eye Near:    Bilateral Near:     Physical Exam Vitals and nursing note reviewed.  Constitutional:      General: She is not in acute distress.    Appearance: She is well-developed.  HENT:     Head: Normocephalic and atraumatic.  Eyes:     Conjunctiva/sclera: Conjunctivae normal.  Cardiovascular:     Rate and Rhythm: Normal rate.     Heart sounds: No murmur.  Pulmonary:     Effort: Pulmonary effort is normal. No respiratory distress.  Feet:     Comments: Left foot: Callus/corn medial aspect of the left great toe.  No erythema or fluctuance.  No involvement of the nail.  Full range of motion of the digit.  Tender to palpation directly over the callus/corn however no tenderness in the surrounding areas.  There is also significant callus/corn at the fifth digit, this is less tender. Skin:    General: Skin is warm and dry.  Neurological:     Mental Status: She is alert.      UC Treatments / Results  Labs (all labs ordered are listed, but only abnormal results are displayed) Labs Reviewed - No data to display  EKG   Radiology No results found.  Procedures Procedures (including critical care time)  Medications Ordered in UC Medications - No data to display  Initial Impression / Assessment and Plan / UC Course  I have reviewed the triage vital signs and the nursing notes.  Pertinent labs & imaging results that were available during my care of the patient were reviewed by me and considered in my medical decision making (see chart for details).     #Corns of multiple toes ##Hypertension #Medication refill Patient is a 64 year old female with past medical history of hypertension presenting with corns on her toes.  There is no sign of infection.  Will patient follow-up with podiatry for  definitive care.  Will trial lidocaine ointment and recommend open toed shoes as much as possible in the interim.  Refill metoprolol.  Patient blood pressure appears stable and well controlled.  Patient to follow-up and establish with primary care for further management.  Return and follow-up precautions were discussed.  Patient verbalized understanding. Final Clinical Impressions(s) / UC Diagnoses   Final diagnoses:  Corns of multiple toes     Discharge Instructions     You may try the lidocaine ointment on the areas that hurt.  Would also like for you to pat the areas or wear open toed shoes to avoid friction.  I have refilled your blood pressure medication, continue this as prescribed.  [facility]) 4.7   (106)  Podiatrist [address] [place]  [phone]    ED Prescriptions    Medication Sig Dispense Auth. Provider   metoprolol tartrate (LOPRESSOR) 25 MG tablet Take 0.5 tablets (12.5 mg total) by mouth 2 (two) times daily. TAKE 1/2 TABLET(12.5 MG) BY MOUTH TWICE DAILY 30 tablet Joseph Bias, Marguerita Beards, PA-C   lidocaine (XYLOCAINE) 5 % ointment Apply 1 application topically as needed. 35.44 g Atul Delucia, Marguerita Beards, PA-C     PDMP not reviewed this encounter.   Purnell Shoemaker, PA-C 10/29/19 2135

## 2019-12-10 ENCOUNTER — Encounter (HOSPITAL_COMMUNITY): Payer: Self-pay | Admitting: *Deleted

## 2019-12-10 ENCOUNTER — Emergency Department (HOSPITAL_COMMUNITY): Payer: Medicaid Other

## 2019-12-10 ENCOUNTER — Other Ambulatory Visit: Payer: Self-pay

## 2019-12-10 ENCOUNTER — Emergency Department (HOSPITAL_COMMUNITY)
Admission: EM | Admit: 2019-12-10 | Discharge: 2019-12-10 | Disposition: A | Discharge status: Home / Self Care | Payer: Medicaid Other | Attending: Emergency Medicine | Admitting: Emergency Medicine

## 2019-12-10 DIAGNOSIS — Z7982 Long term (current) use of aspirin: Secondary | ICD-10-CM | POA: Insufficient documentation

## 2019-12-10 DIAGNOSIS — Z79899 Other long term (current) drug therapy: Secondary | ICD-10-CM | POA: Insufficient documentation

## 2019-12-10 DIAGNOSIS — I10 Essential (primary) hypertension: Secondary | ICD-10-CM | POA: Insufficient documentation

## 2019-12-10 DIAGNOSIS — E860 Dehydration: Secondary | ICD-10-CM | POA: Insufficient documentation

## 2019-12-10 DIAGNOSIS — R519 Headache, unspecified: Secondary | ICD-10-CM | POA: Insufficient documentation

## 2019-12-10 LAB — COMPREHENSIVE METABOLIC PANEL
ALT: 19 U/L (ref 0–44)
AST: 22 U/L (ref 15–41)
Albumin: 3.6 g/dL (ref 3.5–5.0)
Alkaline Phosphatase: 77 U/L (ref 38–126)
Anion gap: 8 (ref 5–15)
BUN: 16 mg/dL (ref 8–23)
CO2: 25 mmol/L (ref 22–32)
Calcium: 9 mg/dL (ref 8.9–10.3)
Chloride: 103 mmol/L (ref 98–111)
Creatinine, Ser: 0.83 mg/dL (ref 0.44–1.00)
GFR calc Af Amer: >60 mL/min (ref >60)
GFR calc non Af Amer: >60 mL/min (ref >60)
Glucose, Bld: 90 mg/dL (ref 70–99)
Potassium: 3.9 mmol/L (ref 3.5–5.1)
Sodium: 136 mmol/L (ref 135–145)
Total Bilirubin: 0.2 mg/dL — ABNORMAL LOW (ref 0.3–1.2)
Total Protein: 7.8 g/dL (ref 6.5–8.1)

## 2019-12-10 LAB — CBC WITH DIFFERENTIAL/PLATELET
Abs Immature Granulocytes: 0.04 10*3/uL (ref 0.00–0.07)
Basophils Absolute: 0 10*3/uL (ref 0.0–0.1)
Basophils Relative: 0 %
Eosinophils Absolute: 0.1 10*3/uL (ref 0.0–0.5)
Eosinophils Relative: 1 %
HCT: 42 % (ref 36.0–46.0)
Hemoglobin: 13 g/dL (ref 12.0–15.0)
Immature Granulocytes: 0 %
Lymphocytes Relative: 21 %
Lymphs Abs: 2.2 10*3/uL (ref 0.7–4.0)
MCH: 26.3 pg (ref 26.0–34.0)
MCHC: 31 g/dL (ref 30.0–36.0)
MCV: 84.8 fL (ref 80.0–100.0)
Monocytes Absolute: 0.7 10*3/uL (ref 0.1–1.0)
Monocytes Relative: 7 %
Neutro Abs: 7.1 10*3/uL (ref 1.7–7.7)
Neutrophils Relative %: 71 %
Platelets: 280 10*3/uL (ref 150–400)
RBC: 4.95 MIL/uL (ref 3.87–5.11)
RDW: 15.9 % — ABNORMAL HIGH (ref 11.5–15.5)
WBC: 10.2 10*3/uL (ref 4.0–10.5)
nRBC: 0 % (ref 0.0–0.2)

## 2019-12-10 MED ORDER — ACETAMINOPHEN 325 MG PO TABS
650.0000 mg | ORAL_TABLET | Freq: Once | ORAL | Status: AC
  Administered 2019-12-10: 650 mg via ORAL
  Filled 2019-12-10: qty 2

## 2019-12-10 MED ORDER — SODIUM CHLORIDE 0.9 % IV BOLUS
500.0000 mL | Freq: Once | INTRAVENOUS | Status: AC
  Administered 2019-12-10: 500 mL via INTRAVENOUS

## 2019-12-10 NOTE — ED Triage Notes (Signed)
Pt reports being dizzy because she got to hot at a family cook out. Pt denies LOC. Pt also reports not feeling good for last 3 days.

## 2019-12-10 NOTE — ED Provider Notes (Signed)
Medical screening examination/treatment/procedure(s) were conducted as a shared visit with non-physician practitioner(s) and myself.  I personally evaluated the patient during the encounter.  EKG Interpretation  Date/Time:  Saturday December 10 2019 17:22:22 EDT Ventricular Rate:  85 PR Interval:    QRS Duration: 103 QT Interval:  375 QTC Calculation: 446 R Axis:   34 Text Interpretation: Sinus rhythm Consider right atrial enlargement Consider right ventricular hypertrophy Borderline T abnormalities, anterior leads No significant change since last tracing Confirmed by Lorre Nick ([id number]) on 12/10/2019 5:85:46 PM  64 year old female presents after having dizziness after being outside for quite some time. No chest pain or shortness of breath. Did have a mild headache. Felt better when she went inside. Work appears reassuring. She is not orthostatic. Will discharge home   Lorre Nick, MD 12/10/19 2008

## 2019-12-10 NOTE — ED Notes (Signed)
Patient transported to CT 

## 2019-12-10 NOTE — ED Provider Notes (Signed)
[facility] EMERGENCY DEPARTMENT Provider Note   CSN: [id number] Arrival date & time: 12/10/19  1716     History Chief Complaint  Patient presents with  . Dizziness    Michele Gross is a 64 y.o. female.  HPI   Patient presents emerged department with chief complaint of headache and dizziness. Patient states while she was outside cooking for a family event she felt like she had a splitting headache with left knee pain and dizziness. Patient states over the last 3 days she has had this headache  feels its from been being stressed due to her family reunion. Patient denies any numbness or tingling in her arms or legs, she denies any recent falls, denies any chest pain or shortness of breath or becoming diaphoretic. Patient explains that she thinks today she just got overheated which is why she felt dizzy. Patient's only significant medical history is hypertension which she takes blood pressure medications on a daily basis,  she has no other surgical history.  Past Medical History:  Diagnosis Date  . Chest pain    Negative nuclear stress 2007, negative stress echo 2011, reported negative cath 2000; negative stress echo November 2013  . Chronic shoulder pain   . DJD (degenerative joint disease)   . GERD (gastroesophageal reflux disease)   . Hypertension   . Obesity   . Sleep apnea     Patient Active Problem List   Diagnosis Date Noted  . Cervical radiculopathy due to degenerative joint disease of spine 05/02/2012  . Chest pain, atypical 05/02/2012  . HYPERLIPIDEMIA 07/01/2007  . GERD 07/01/2007  . BACK PAIN 07/01/2007    Past Surgical History:  Procedure Laterality Date  . PARTIAL HYSTERECTOMY       OB History   No obstetric history on file.     Family History  Problem Relation Age of Onset  . Cancer Father        colon and later "throat;" in remission    Social History   Tobacco Use  . Smoking status: Never Smoker  . Smokeless tobacco:  Never Used  Vaping Use  . Vaping Use: Never used  Substance Use Topics  . Alcohol use: Yes    Alcohol/week: 2.0 standard drinks    Types: 2 Glasses of wine per week    Comment: occassional  . Drug use: No    Home Medications Prior to Admission medications   Medication Sig Start Date End Date Taking? Authorizing Provider  albuterol (PROVENTIL HFA;VENTOLIN HFA) 108 (90 Base) MCG/ACT inhaler Inhale 2 puffs into the lungs every 4 (four) hours as needed for wheezing or shortness of breath (cough, shortness of breath or wheezing.). 08/10/17   Elvina Sidle, MD  aspirin EC 81 MG EC tablet Take 1 tablet (81 mg total) by mouth daily. 05/03/12   Kristie Cowman, MD  celecoxib (CELEBREX) 100 MG capsule Take 1 capsule (100 mg total) by mouth 2 (two) times daily. 01/08/19   Dione Booze, MD  lidocaine (XYLOCAINE) 5 % ointment Apply 1 application topically as needed. 10/29/19   Darr, Veryl Speak, PA-C  LORazepam (ATIVAN) 1 MG tablet Take 1 tablet (1 mg total) by mouth 3 (three) times daily as needed for anxiety. 07/04/19   Mannie Stabile, PA-C  metoprolol tartrate (LOPRESSOR) 25 MG tablet Take 0.5 tablets (12.5 mg total) by mouth 2 (two) times daily. TAKE 1/2 TABLET(12.5 MG) BY MOUTH TWICE DAILY 10/29/19 12/28/19  Darr, Veryl Speak, PA-C    Allergies  Percocet [oxycodone-acetaminophen]  Review of Systems   Review of Systems  Constitutional: Negative for chills and fever.  HENT: Negative for congestion, sore throat and tinnitus.   Eyes: Negative for pain and visual disturbance.  Respiratory: Negative for shortness of breath.   Cardiovascular: Negative for chest pain and leg swelling.  Gastrointestinal: Negative for abdominal pain.  Genitourinary: Negative for enuresis.  Musculoskeletal: Negative for back pain.       Chronic left knee pain.  Skin: Negative for rash.  Neurological: Positive for headaches. Negative for dizziness.  Hematological: Does not bruise/bleed easily.    Physical Exam Updated  Vital Signs BP 130/72 (BP Location: Right Arm)   Pulse 80   Temp 99.8 F (37.7 C) (Oral)   Resp (!) 22   Ht 5\' 4"  (1.626 m)   Wt 117.9 kg   SpO2 96%   BMI 44.63 kg/m   Physical Exam Vitals and nursing note reviewed.  Constitutional:      General: She is not in acute distress.    Appearance: Normal appearance. She is not ill-appearing or diaphoretic.  HENT:     Head: Normocephalic and atraumatic.     Nose: No congestion or rhinorrhea.     Mouth/Throat:     Mouth: Mucous membranes are moist.     Pharynx: Oropharynx is clear.  Eyes:     General: No visual field deficit or scleral icterus.    Extraocular Movements: Extraocular movements intact.     Conjunctiva/sclera: Conjunctivae normal.     Pupils: Pupils are equal, round, and reactive to light.  Cardiovascular:     Rate and Rhythm: Normal rate and regular rhythm.     Pulses: Normal pulses.     Heart sounds: No murmur heard.  No friction rub. No gallop.   Pulmonary:     Effort: Pulmonary effort is normal. No respiratory distress.     Breath sounds: No wheezing, rhonchi or rales.  Abdominal:     General: There is no distension.     Palpations: Abdomen is soft.     Tenderness: There is no abdominal tenderness. There is no guarding.  Musculoskeletal:        General: No swelling or tenderness.     Comments: Patient's left knee was visualized, there is no rash, erythema, swelling, gross abnormalities noted.  Patient had full range of motion, 5 out of 5 strength, good pedal pulses, good cap refill sensory was intact.  Skin:    General: Skin is warm and dry.     Capillary Refill: Capillary refill takes less than 2 seconds.     Findings: No rash.  Neurological:     General: No focal deficit present.     Mental Status: She is alert and oriented to person, place, and time.     GCS: GCS eye subscore is 4. GCS verbal subscore is 5. GCS motor subscore is 6.     Cranial Nerves: Cranial nerves are intact. No cranial nerve deficit or  facial asymmetry.     Sensory: Sensation is intact. No sensory deficit.     Motor: Motor function is intact. No weakness or pronator drift.     Coordination: Coordination is intact. Romberg sign negative. Finger-Nose-Finger Test and Heel to [employer] Test normal.  Psychiatric:        Mood and Affect: Mood normal.     ED Results / Procedures / Treatments   Labs (all labs ordered are listed, but only abnormal results are displayed) Labs Reviewed  COMPREHENSIVE METABOLIC PANEL - Abnormal; Notable for the following components:      Result Value   Total Bilirubin 0.2 (*)    All other components within normal limits  CBC WITH DIFFERENTIAL/PLATELET - Abnormal; Notable for the following components:   RDW 15.9 (*)    All other components within normal limits    EKG EKG Interpretation  Date/Time:  Saturday December 10 2019 17:22:22 EDT Ventricular Rate:  85 PR Interval:    QRS Duration: 103 QT Interval:  375 QTC Calculation: 446 R Axis:   34 Text Interpretation: Sinus rhythm Consider right atrial enlargement Consider right ventricular hypertrophy Borderline T abnormalities, anterior leads No significant change since last tracing Confirmed by Lacretia Leigh ([id number]) on 12/10/2019 5:53:34 PM   Radiology CT Head Wo Contrast  Result Date: 12/10/2019 CLINICAL DATA:  Dizziness. EXAM: CT HEAD WITHOUT CONTRAST TECHNIQUE: Contiguous axial images were obtained from the base of the skull through the vertex without intravenous contrast. COMPARISON:  March 15, 2019 FINDINGS: Brain: No evidence of acute infarction, hemorrhage, hydrocephalus, extra-axial collection or mass lesion/mass effect. Vascular: No hyperdense vessel or unexpected calcification. Skull: Normal. Negative for fracture or focal lesion. Sinuses/Orbits: No acute finding. Other: None. IMPRESSION: No acute intracranial pathology. Electronically Signed   By: Virgina Norfolk M.D.   On: 12/10/2019 18:32    Procedures Procedures (including  critical care time)  Medications Ordered in ED Medications  sodium chloride 0.9 % bolus 500 mL (0 mLs Intravenous Stopped 12/10/19 2036)  acetaminophen (TYLENOL) tablet 650 mg (650 mg Oral Given 12/10/19 1909)    ED Course  I have reviewed the triage vital signs and the nursing notes.  Pertinent labs & imaging results that were available during my care of the patient were reviewed by me and considered in my medical decision making (see chart for details).    MDM Rules/Calculators/A&P                          I have personally reviewed all imaging, labs and have interpreted them.  Due to patient's presentation most concern for brain mass versus CVA versus heat exhaustion.  Unlikely that patient is suffering from a brain mass or CVA as head CT did not show any acute abnormalities and patient had a normal neuro exam without deficits.  Patient's EKG showed sinus rhythm without any ischemia. She denies any chest pain, shortness of breath or leg  Pain. physical exam did not see edema in her legs making MI or PE unlikely.  Patient's labs were reassuring CMP did not show electrolyte abnormalities, no elevated liver enzymes, no AKI.  CBC did not show leukocytosis or anemia.  Patient appears to be resting comfortably in bed showing no signs of acute distress, breathing without difficulty.  Vital signs have remained stable patient does not meet emergent criteria to be admitted to the hospital.  Likely that patient felt dizzy due to dehydration or possibly becoming overheated.  Patient was instructed to drink plenty of water and stay out of the heat.  Patient was given at home instructions as well as strict return precautions.  Patient was discussed with attending who saw the patient and agrees with assessment and plan.  Patient was explained the results and plan and she agrees with said plan. Final Clinical Impression(s) / ED Diagnoses Final diagnoses:  Dehydration    Rx / DC Orders ED Discharge  Orders    None       Ileene Patrick,  Anselm Pancoast, PA-C 12/11/19 0000    Lorre Nick, MD 12/11/19 1932

## 2019-12-10 NOTE — Discharge Instructions (Addendum)
You have been treated for dehydration.  I want you to stay hydrated please drink plenty of water, Gatorade, stay away from sodas, ice tea, coffee as these can make you more dehydrated.  I want you to follow-up with your primary care doctor for further evaluation.  I want to come back to emergency department if you develop slurring of speech, change in vision, start to lose balance, feel numbness or tingling in the arms or legs, chest pain, shortness of breath or the symptoms require further evaluation.

## 2020-01-06 ENCOUNTER — Encounter (HOSPITAL_COMMUNITY): Payer: Self-pay

## 2020-01-06 ENCOUNTER — Ambulatory Visit (HOSPITAL_COMMUNITY)
Admission: EM | Admit: 2020-01-06 | Discharge: 2020-01-06 | Disposition: A | Discharge status: Home / Self Care | Payer: Medicare Other | Attending: Emergency Medicine | Admitting: Emergency Medicine

## 2020-01-06 ENCOUNTER — Other Ambulatory Visit: Payer: Self-pay

## 2020-01-06 DIAGNOSIS — K0889 Other specified disorders of teeth and supporting structures: Secondary | ICD-10-CM

## 2020-01-06 MED ORDER — IBUPROFEN 800 MG PO TABS
800.0000 mg | ORAL_TABLET | Freq: Once | ORAL | Status: AC
  Administered 2020-01-06: 800 mg via ORAL

## 2020-01-06 MED ORDER — PENICILLIN V POTASSIUM 500 MG PO TABS: 500.0000 mg | ORAL_TABLET | Freq: Four times a day (QID) | ORAL | 0 refills | Status: AC

## 2020-01-06 MED ORDER — NAPROXEN 500 MG PO TABS: 500.0000 mg | ORAL_TABLET | Freq: Two times a day (BID) | ORAL | 0 refills | Status: DC

## 2020-01-06 MED ORDER — IBUPROFEN 800 MG PO TABS
ORAL_TABLET | ORAL | Status: AC
  Filled 2020-01-06: qty 1

## 2020-01-06 NOTE — ED Triage Notes (Addendum)
Pt c/o dental abscess in the right front of mouthx1 wk. Pt states she can't eat due to the pain. Pt also wants refill on metoprolol 25mg .

## 2020-01-06 NOTE — ED Provider Notes (Signed)
[facility]    CSN: [id number] Arrival date & time: 01/06/20  7673      History   Chief Complaint Chief Complaint  Patient presents with   Dental Pain    HPI Michele Gross is a 64 y.o. female.   Michele Gross presents with complaints of dental pain. Over the past week the pain has increased. She feels like she had an abscess which drained. Throbbing pain. She has tried topical ora gel which has not helped, otherwise hasn't taken any medications for pain. She has had facial swelling which has improved. She is not on any blood thinners. She doesn't have a dentist currently due to lack of insurance. The tooth is broken off.   ROS per HPI, negative if not otherwise mentioned.      Past Medical History:  Diagnosis Date   Chest pain    Negative nuclear stress 2007, negative stress echo 2011, reported negative cath 2000; negative stress echo November 2013   Chronic shoulder pain    DJD (degenerative joint disease)    GERD (gastroesophageal reflux disease)    Hypertension    Obesity    Sleep apnea     Patient Active Problem List   Diagnosis Date Noted   Cervical radiculopathy due to degenerative joint disease of spine 05/02/2012   Chest pain, atypical 05/02/2012   HYPERLIPIDEMIA 07/01/2007   GERD 07/01/2007   BACK PAIN 07/01/2007    Past Surgical History:  Procedure Laterality Date   PARTIAL HYSTERECTOMY      OB History   No obstetric history on file.      Home Medications    Prior to Admission medications   Medication Sig Start Date End Date Taking? Authorizing Provider  albuterol (PROVENTIL HFA;VENTOLIN HFA) 108 (90 Base) MCG/ACT inhaler Inhale 2 puffs into the lungs every 4 (four) hours as needed for wheezing or shortness of breath (cough, shortness of breath or wheezing.). 08/10/17   Elvina Sidle, MD  aspirin EC 81 MG EC tablet Take 1 tablet (81 mg total) by mouth daily. 05/03/12   Kristie Cowman, MD  celecoxib  (CELEBREX) 100 MG capsule Take 1 capsule (100 mg total) by mouth 2 (two) times daily. 01/08/19   Dione Booze, MD  lidocaine (XYLOCAINE) 5 % ointment Apply 1 application topically as needed. 10/29/19   Darr, Veryl Speak, PA-C  LORazepam (ATIVAN) 1 MG tablet Take 1 tablet (1 mg total) by mouth 3 (three) times daily as needed for anxiety. 07/04/19   Mannie Stabile, PA-C  metoprolol tartrate (LOPRESSOR) 25 MG tablet Take 0.5 tablets (12.5 mg total) by mouth 2 (two) times daily. TAKE 1/2 TABLET(12.5 MG) BY MOUTH TWICE DAILY 10/29/19 12/28/19  Darr, Veryl Speak, PA-C  naproxen (NAPROSYN) 500 MG tablet Take 1 tablet (500 mg total) by mouth 2 (two) times daily. 01/06/20   Georgetta Haber, NP  penicillin v potassium (VEETID) 500 MG tablet Take 1 tablet (500 mg total) by mouth 4 (four) times daily for 7 days. 01/06/20 01/13/20  Georgetta Haber, NP    Family History Family History  Problem Relation Age of Onset   Cancer Father        colon and later "throat;" in remission    Social History Social History   Tobacco Use   Smoking status: Never Smoker   Smokeless tobacco: Never Used  Vaping Use   Vaping Use: Never used  Substance Use Topics   Alcohol use: Yes    Alcohol/week: 2.0 standard  drinks    Types: 2 Glasses of wine per week    Comment: occassional   Drug use: No     Allergies   Percocet [oxycodone-acetaminophen]   Review of Systems Review of Systems   Physical Exam Triage Vital Signs ED Triage Vitals  Enc Vitals Group     BP 01/06/20 0906 123/75     Pulse Rate 01/06/20 0906 60     Resp 01/06/20 0906 18     Temp 01/06/20 0906 98.1 F (36.7 C)     Temp Source 01/06/20 0906 Oral     SpO2 01/06/20 0906 100 %     Weight 01/06/20 0907 260 lb (117.9 kg)     Height 01/06/20 0907 5\' 3"  (1.6 m)     Head Circumference --      Peak Flow --      Pain Score 01/06/20 0907 10     Pain Loc --      Pain Edu? --      Excl. in GC? --    No data found.  Updated Vital Signs BP 123/75     Pulse 60    Temp 98.1 F (36.7 C) (Oral)    Resp 18    Ht 5\' 3"  (1.6 m)    Wt 260 lb (117.9 kg)    SpO2 100%    BMI 46.06 kg/m    Physical Exam Constitutional:      General: She is not in acute distress.    Appearance: She is well-developed.  HENT:     Mouth/Throat:     Dentition: Abnormal dentition.      Comments: Teeth broken off to gumline with tenderness underlying. No visible abscess or active drainage; no trismus  Cardiovascular:     Rate and Rhythm: Normal rate.  Pulmonary:     Effort: Pulmonary effort is normal.  Skin:    General: Skin is warm and dry.  Neurological:     Mental Status: She is alert and oriented to person, place, and time.      UC Treatments / Results  Labs (all labs ordered are listed, but only abnormal results are displayed) Labs Reviewed - No data to display  EKG   Radiology No results found.  Procedures Procedures (including critical care time)  Medications Ordered in UC Medications  ibuprofen (ADVIL) tablet 800 mg (800 mg Oral Given 01/06/20 )    Initial Impression / Assessment and Plan / UC Course  I have reviewed the triage vital signs and the nursing notes.  Pertinent labs & imaging results that were available during my care of the patient were reviewed by me and considered in my medical decision making (see chart for details).     Antibiotics provided for likely dental infection related to broken off tooth. Encouraged follow up with dentist. Return precautions provided. Patient verbalized understanding and agreeable to plan.   Final Clinical Impressions(s) / UC Diagnoses   Final diagnoses:  Pain, dental     Discharge Instructions     Complete course of antibiotics.  Please follow up with a dentist for definitive treatment.  Please establish with a primary care provider for long term management of your blood pressure medications.    ED Prescriptions    Medication Sig Dispense Auth. Provider   naproxen (NAPROSYN)  500 MG tablet Take 1 tablet (500 mg total) by mouth 2 (two) times daily. 30 tablet 01/08/20 B, NP   penicillin v potassium (VEETID) 500 MG tablet Take 1  tablet (500 mg total) by mouth 4 (four) times daily for 7 days. 28 tablet Georgetta Haber, NP     PDMP not reviewed this encounter.   Georgetta Haber, NP 01/06/20 1430

## 2020-01-06 NOTE — Discharge Instructions (Signed)
Complete course of antibiotics.  Please follow up with a dentist for definitive treatment.  Please establish with a primary care provider for long term management of your blood pressure medications.

## 2020-01-11 ENCOUNTER — Ambulatory Visit (HOSPITAL_COMMUNITY)
Admission: EM | Admit: 2020-01-11 | Discharge: 2020-01-11 | Disposition: A | Discharge status: Home / Self Care | Payer: Medicare Other | Attending: Family Medicine | Admitting: Family Medicine

## 2020-01-11 ENCOUNTER — Encounter (HOSPITAL_COMMUNITY): Payer: Self-pay

## 2020-01-11 DIAGNOSIS — Z760 Encounter for issue of repeat prescription: Secondary | ICD-10-CM

## 2020-01-11 DIAGNOSIS — I10 Essential (primary) hypertension: Secondary | ICD-10-CM

## 2020-01-11 MED ORDER — METOPROLOL TARTRATE 25 MG PO TABS: 12.5000 mg | ORAL_TABLET | Freq: Two times a day (BID) | ORAL | 0 refills | Status: AC

## 2020-01-11 NOTE — Discharge Instructions (Addendum)
I have refilled your metoprolol for 30 days.  Unfortunately we are not able to manage your blood pressure and medication long term.  I do recommend establishing with a primary care provider for recheck and long term management of your medications.

## 2020-01-11 NOTE — ED Provider Notes (Signed)
[facility]    CSN: [id number] Arrival date & time: 01/11/20  0957      History   Chief Complaint Chief Complaint  Patient presents with   Medication Refill    HPI Michele Gross is a 64 y.o. female.   Michele Gross presents with requests for refill of her metoprolol. She takes 1/2 tab BID. Out for the past 5 days. She has not had insurance until 7/1, she has an appointment with PCP 7/28. Mild headache, no vision changes, no palpitations, no chest pain , no shortness of breath, no dizziness, no leg swelling.    ROS per HPI, negative if not otherwise mentioned.      Past Medical History:  Diagnosis Date   Chest pain    Negative nuclear stress 2007, negative stress echo 2011, reported negative cath 2000; negative stress echo November 2013   Chronic shoulder pain    DJD (degenerative joint disease)    GERD (gastroesophageal reflux disease)    Hypertension    Obesity    Sleep apnea     Patient Active Problem List   Diagnosis Date Noted   Cervical radiculopathy due to degenerative joint disease of spine 05/02/2012   Chest pain, atypical 05/02/2012   HYPERLIPIDEMIA 07/01/2007   GERD 07/01/2007   BACK PAIN 07/01/2007    Past Surgical History:  Procedure Laterality Date   PARTIAL HYSTERECTOMY      OB History   No obstetric history on file.      Home Medications    Prior to Admission medications   Medication Sig Start Date End Date Taking? Authorizing Provider  albuterol (PROVENTIL HFA;VENTOLIN HFA) 108 (90 Base) MCG/ACT inhaler Inhale 2 puffs into the lungs every 4 (four) hours as needed for wheezing or shortness of breath (cough, shortness of breath or wheezing.). 08/10/17   Elvina Sidle, MD  aspirin EC 81 MG EC tablet Take 1 tablet (81 mg total) by mouth daily. 05/03/12   Kristie Cowman, MD  celecoxib (CELEBREX) 100 MG capsule Take 1 capsule (100 mg total) by mouth 2 (two) times daily. 01/08/19   Dione Booze, MD    lidocaine (XYLOCAINE) 5 % ointment Apply 1 application topically as needed. 10/29/19   Darr, Veryl Speak, PA-C  LORazepam (ATIVAN) 1 MG tablet Take 1 tablet (1 mg total) by mouth 3 (three) times daily as needed for anxiety. 07/04/19   Mannie Stabile, PA-C  metoprolol tartrate (LOPRESSOR) 25 MG tablet Take 0.5 tablets (12.5 mg total) by mouth 2 (two) times daily. TAKE 1/2 TABLET(12.5 MG) BY MOUTH TWICE DAILY 01/11/20 02/10/20  Linus Mako B, NP  naproxen (NAPROSYN) 500 MG tablet Take 1 tablet (500 mg total) by mouth 2 (two) times daily. 01/06/20   Georgetta Haber, NP  penicillin v potassium (VEETID) 500 MG tablet Take 1 tablet (500 mg total) by mouth 4 (four) times daily for 7 days. 01/06/20 01/13/20  Georgetta Haber, NP    Family History Family History  Problem Relation Age of Onset   Cancer Father        colon and later "throat;" in remission    Social History Social History   Tobacco Use   Smoking status: Never Smoker   Smokeless tobacco: Never Used  Vaping Use   Vaping Use: Never used  Substance Use Topics   Alcohol use: Yes    Alcohol/week: 2.0 standard drinks    Types: 2 Glasses of wine per week    Comment: occassional  Drug use: No     Allergies   Percocet [oxycodone-acetaminophen]   Review of Systems Review of Systems   Physical Exam Triage Vital Signs ED Triage Vitals  Enc Vitals Group     BP 01/11/20 1037 128/81     Pulse Rate 01/11/20 1037 67     Resp 01/11/20 1037 16     Temp 01/11/20 1037 98.2 F (36.8 C)     Temp Source 01/11/20 1037 Oral     SpO2 01/11/20 1037 99 %     Weight --      Height --      Head Circumference --      Peak Flow --      Pain Score 01/11/20 1039 0     Pain Loc --      Pain Edu? --      Excl. in GC? --    No data found.  Updated Vital Signs BP 128/81 (BP Location: Right Arm)    Pulse 67    Temp 98.2 F (36.8 C) (Oral)    Resp 16    SpO2 99%    Physical Exam Constitutional:      General: She is not in acute  distress.    Appearance: She is well-developed.  Cardiovascular:     Rate and Rhythm: Normal rate.  Pulmonary:     Effort: Pulmonary effort is normal.  Skin:    General: Skin is warm and dry.  Neurological:     Mental Status: She is alert and oriented to person, place, and time.      UC Treatments / Results  Labs (all labs ordered are listed, but only abnormal results are displayed) Labs Reviewed - No data to display  EKG   Radiology No results found.  Procedures Procedures (including critical care time)  Medications Ordered in UC Medications - No data to display  Initial Impression / Assessment and Plan / UC Course  I have reviewed the triage vital signs and the nursing notes.  Pertinent labs & imaging results that were available during my care of the patient were reviewed by me and considered in my medical decision making (see chart for details).     BP looks great today despite not taking medication. She states she has been exercising more and watching her diet. Low dose of metoprolol, refilled at this time emphasizing need to follow up with PCP for recheck and management. Patient verbalized understanding and agreeable to plan.    Final Clinical Impressions(s) / UC Diagnoses   Final diagnoses:  Encounter for medication refill  Essential hypertension     Discharge Instructions     I have refilled your metoprolol for 30 days.  Unfortunately we are not able to manage your blood pressure and medication long term.  I do recommend establishing with a primary care provider for recheck and long term management of your medications.    ED Prescriptions    Medication Sig Dispense Auth. Provider   metoprolol tartrate (LOPRESSOR) 25 MG tablet Take 0.5 tablets (12.5 mg total) by mouth 2 (two) times daily. TAKE 1/2 TABLET(12.5 MG) BY MOUTH TWICE DAILY 30 tablet Georgetta Haber, NP     PDMP not reviewed this encounter.   Georgetta Haber, NP 01/11/20 1052

## 2020-01-11 NOTE — ED Triage Notes (Signed)
Pt here requesting refill of metoprolol. Pt states she has a headache, no vision changes.

## 2020-02-23 ENCOUNTER — Other Ambulatory Visit: Payer: Self-pay | Admitting: Student

## 2020-02-23 ENCOUNTER — Ambulatory Visit
Admission: RE | Admit: 2020-02-23 | Discharge: 2020-02-23 | Disposition: A | Discharge status: Home / Self Care | Payer: Medicare HMO | Source: Ambulatory Visit | Attending: Student | Admitting: Student

## 2020-02-23 DIAGNOSIS — M25562 Pain in left knee: Secondary | ICD-10-CM

## 2020-02-23 DIAGNOSIS — M1990 Unspecified osteoarthritis, unspecified site: Secondary | ICD-10-CM

## 2020-03-19 ENCOUNTER — Ambulatory Visit: Payer: Self-pay

## 2020-03-19 ENCOUNTER — Ambulatory Visit (INDEPENDENT_AMBULATORY_CARE_PROVIDER_SITE_OTHER): Payer: Medicare HMO | Admitting: Orthopedic Surgery

## 2020-03-19 VITALS — Ht 63.0 in | Wt 275.0 lb

## 2020-03-19 DIAGNOSIS — M79605 Pain in left leg: Secondary | ICD-10-CM | POA: Diagnosis not present

## 2020-03-19 MED ORDER — METHOCARBAMOL 500 MG PO TABS
500.0000 mg | ORAL_TABLET | Freq: Three times a day (TID) | ORAL | 0 refills | Status: DC | PRN
Start: 2020-03-19 — End: 2021-11-21

## 2020-03-19 MED ORDER — ACETAMINOPHEN-CODEINE #3 300-30 MG PO TABS: 1.0000 | ORAL_TABLET | Freq: Four times a day (QID) | ORAL | 0 refills | Status: DC | PRN

## 2020-03-19 MED ORDER — METHYLPREDNISOLONE 4 MG PO TABS
4.0000 mg | ORAL_TABLET | Freq: Every day | ORAL | 0 refills | Status: DC
Start: 2020-03-19 — End: 2021-11-21

## 2020-03-21 ENCOUNTER — Encounter: Payer: Self-pay | Admitting: Orthopedic Surgery

## 2020-03-21 NOTE — Progress Notes (Signed)
Office Visit Note   Patient: Michele Gross           Date of Birth: 1956/05/25           MRN: [id number] Visit Date: 03/19/2020 Requested by: No referring provider defined for this encounter. PCP: Pcp, No  Subjective: Chief Complaint  Patient presents with  . Left Leg - Pain    HPI: Michele Gross is a 64 year old female with left knee and low back pain. She is ambulating with a cane. No diabetes and she is not a smoker. Describes numbness and tingling in the leg with low back pain but no groin pain. The buttock pain radiates down the leg. Hard for her to weight-bear. She has a history of remote epidural steroid injections. She is had left knee pain now for a year but this pain she is having affects the entire leg. Had to take Percocet from her sister for pain relief. The pain is debilitating. Denies any fevers chills or saddle paresthesias.              ROS: All systems reviewed are negative as they relate to the chief complaint within the history of present illness.  Patient denies  fevers or chills.   Assessment & Plan: Visit Diagnoses:  1. 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 Plan is Medrol Dosepak 6-day course with Robaxin and Tylenol 3. MRI lumbar spine to evaluate left-sided radiculopathy. She has essentially intractable pain and has failed conservative management. I will think the left knee arthritis is really at play here. She is having to use a cane and can essentially barely walk around. She does have some mild weakness to leg extension on the left compared to the right.  Follow-Up Instructions: Return for after MRI.   Orders:  Orders Placed This Encounter  Procedures  . XR Knee 1-2 Views Left  . XR Lumbar Spine 2-3 Views  . MR Lumbar Spine w/o contrast   Meds ordered this encounter  Medications  . methocarbamol (ROBAXIN) 500 MG tablet    Sig: Take 1 tablet (500 mg  total) by mouth every 8 (eight) hours as needed for muscle spasms.    Dispense:  35 tablet    Refill:  0  . acetaminophen-codeine (TYLENOL #3) 300-30 MG tablet    Sig: Take 1 tablet by mouth every 6 (six) hours as needed for moderate pain.    Dispense:  28 tablet    Refill:  0  . methylPREDNISolone (MEDROL) 4 MG tablet    Sig: Take 1 tablet (4 mg total) by mouth daily.    Dispense:  21 tablet    Refill:  0      Procedures: No procedures performed   Clinical Data: No additional findings.  Objective: Vital Signs: Ht 5\' 3"  (1.6 m)   Wt 275 lb (124.7 kg)   BMI 48.71 kg/m   Physical Exam:   Constitutional: Patient appears well-developed HEENT:  Head: Normocephalic Eyes:EOM are normal Neck: Normal range of motion Cardiovascular: Normal rate Pulmonary/chest: Effort normal Neurologic: Patient is alert Skin: Skin is warm Psychiatric: Patient has normal mood and affect    Ortho Exam: Ortho exam demonstrates antalgic gait to the left. Pedal pulses palpable. Leg extension strength 5- out of 5 on the left compared to 5+ out of 5 on the right. Ankle dorsiflexion strength 5+ out of 5 bilaterally. Does have paresthesias in the  L4 distribution on the left compared to the right. She has positive nerve root tension signs on the left as well as positive contralateral straight leg raise on the right. No groin pain with internal X rotation of the leg. No knee effusion but valgus alignment is present on the left-hand side. Extensor mechanism is intact bilaterally. Negative clonus negative Pinsky with symmetric reflexes bilateral patella and Achilles.  Specialty Comments:  No specialty comments available.  Imaging: No results found.   PMFS History: Patient Active Problem List   Diagnosis Date Noted  . Cervical radiculopathy due to degenerative joint disease of spine 05/02/2012  . Chest pain, atypical 05/02/2012  . HYPERLIPIDEMIA 07/01/2007  . GERD 07/01/2007  . BACK PAIN 07/01/2007     Past Medical History:  Diagnosis Date  . Chest pain    Negative nuclear stress 2007, negative stress echo 2011, reported negative cath 2000; negative stress echo November 2013  . Chronic shoulder pain   . DJD (degenerative joint disease)   . GERD (gastroesophageal reflux disease)   . Hypertension   . Obesity   . Sleep apnea     Family History  Problem Relation Age of Onset  . Cancer Father        colon and later "throat;" in remission    Past Surgical History:  Procedure Laterality Date  . PARTIAL HYSTERECTOMY     Social History   Occupational History  . 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 Smoking status: Never Smoker  . Smokeless tobacco: Never Used  Vaping Use  . Vaping Use: Never used  Substance and Sexual Activity  . Alcohol use: Yes    Alcohol/week: 2.0 standard drinks    Types: 2 Glasses of wine per week    Comment: occassional  . Drug use: No  . Sexual activity: Not Currently

## 2020-03-27 ENCOUNTER — Other Ambulatory Visit: Payer: Self-pay | Admitting: Nurse Practitioner

## 2020-03-27 DIAGNOSIS — B1920 Unspecified viral hepatitis C without hepatic coma: Secondary | ICD-10-CM

## 2020-04-06 ENCOUNTER — Ambulatory Visit
Admission: RE | Admit: 2020-04-06 | Discharge: 2020-04-06 | Disposition: A | Discharge status: Home / Self Care | Payer: Medicare HMO | Source: Ambulatory Visit | Attending: Nurse Practitioner | Admitting: Nurse Practitioner

## 2020-04-06 DIAGNOSIS — B1920 Unspecified viral hepatitis C without hepatic coma: Secondary | ICD-10-CM

## 2020-04-13 NOTE — Progress Notes (Signed)
Not sure why this is coming to me

## 2020-04-15 ENCOUNTER — Other Ambulatory Visit: Payer: Self-pay

## 2020-04-15 ENCOUNTER — Ambulatory Visit
Admission: RE | Admit: 2020-04-15 | Discharge: 2020-04-15 | Disposition: A | Discharge status: Home / Self Care | Payer: Medicare HMO | Source: Ambulatory Visit | Attending: Orthopedic Surgery | Admitting: Orthopedic Surgery

## 2020-04-15 DIAGNOSIS — M79605 Pain in left leg: Secondary | ICD-10-CM

## 2020-04-25 ENCOUNTER — Ambulatory Visit: Payer: Medicare HMO | Admitting: Orthopedic Surgery

## 2020-04-25 ENCOUNTER — Other Ambulatory Visit: Payer: Self-pay

## 2020-04-25 DIAGNOSIS — M47816 Spondylosis without myelopathy or radiculopathy, lumbar region: Secondary | ICD-10-CM

## 2020-04-26 ENCOUNTER — Encounter: Payer: Self-pay | Admitting: Orthopedic Surgery

## 2020-04-26 NOTE — Progress Notes (Signed)
Office Visit Note   Patient: Michele Gross           Date of Birth: [birth year]           MRN: [id number] Visit Date: 04/25/2020 Requested by: No referring provider defined for this encounter. PCP: Pcp, No  Subjective: Chief Complaint  Patient presents with  . scan review    HPI: Michele Gross is a 64 year old patient with low back pain. Reports significant low back pain worse with ambulation which she localizes in the lower lumbar spine region. Denies any red flag symptoms. Since she was last seen she has had an MRI scan. She does have occasional radiation into that left buttock and left leg. MRI scan is reviewed and it shows multilevel degenerative changes of the lumbar spine with mild spinal canal stenosis. She does have a lot of facet joint arthritis at L3-4 L4-5 and L5-S1. No definite high-grade spinal canal or neural foraminal narrowing.              ROS: All systems reviewed are negative as they relate to the chief complaint within the history of present illness.  Patient denies  fevers or chills.   Assessment & Plan: Visit Diagnoses:  1. Spondylosis without myelopathy or radiculopathy, lumbar region     Plan: Impression is facet arthritis in the lumbar spine giving her some left-sided symptoms. Most of her symptoms remain in the back. She states she cannot really live with this the way it is. Plan is referral to Dr. Alvester Morin for lumbar spine facet joint ESI's possible ablation. Follow-up with [place] as needed. The scan is reviewed with the patient today and I showed her the pictures of the enlarged facet joints.  Follow-Up Instructions: No follow-ups on file.   Orders:  No orders of the defined types were placed in this encounter.  No orders of the defined types were placed in this encounter.     Procedures: No procedures performed   Clinical Data: No additional findings.  Objective: Vital Signs: There were no vitals taken for this visit.  Physical Exam:    Constitutional: Patient appears well-developed HEENT:  Head: Normocephalic Eyes:EOM are normal Neck: Normal range of motion Cardiovascular: Normal rate Pulmonary/chest: Effort normal Neurologic: Patient is alert Skin: Skin is warm Psychiatric: Patient has normal mood and affect    Ortho Exam: Ortho exam demonstrates full active and passive range of motion of the knees ankles and hips. No nerve root tension signs. No paresthesias L1-S1 bilaterally. Reflex symmetric. Pedal pulses palpable. No other masses lymphadenopathy or skin changes noted in that lumbar spine region. No muscle atrophy.  Specialty Comments:  No specialty comments available.  Imaging: No results found.   PMFS History: Patient Active Problem List   Diagnosis Date Noted  . Cervical radiculopathy due to degenerative joint disease of spine 05/02/2012  . Chest pain, atypical 05/02/2012  . HYPERLIPIDEMIA 07/01/2007  . GERD 07/01/2007  . BACK PAIN 07/01/2007   Past Medical History:  Diagnosis Date  . Chest pain    Negative nuclear stress 2007, negative stress echo 2011, reported negative cath 2000; negative stress echo November 2013  . Chronic shoulder pain   . DJD (degenerative joint disease)   . GERD (gastroesophageal reflux disease)   . Hypertension   . Obesity   . Sleep apnea     Family History  Problem Relation Age of Onset  . Cancer Father        colon and later "throat;" in remission  Past Surgical History:  Procedure Laterality Date  . PARTIAL HYSTERECTOMY     Social History   Occupational History  . 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 Smoking status: Never Smoker  . Smokeless tobacco: Never Used  Vaping Use  . Vaping Use: Never used  Substance and Sexual Activity  . Alcohol use: Yes    Alcohol/week: 2.0 standard drinks    Types: 2 Glasses of wine per week    Comment: occassional  . Drug use: No  . Sexual activity:  Not Currently

## 2020-04-26 NOTE — Addendum Note (Signed)
Addended byPrescott Parma on: 04/26/2020 08:22 AM   Modules accepted: Orders

## 2020-05-24 ENCOUNTER — Ambulatory Visit: Payer: Self-pay

## 2020-05-24 ENCOUNTER — Encounter: Payer: Self-pay | Admitting: Physical Medicine and Rehabilitation

## 2020-05-24 ENCOUNTER — Ambulatory Visit (INDEPENDENT_AMBULATORY_CARE_PROVIDER_SITE_OTHER): Payer: Medicare HMO | Admitting: Physical Medicine and Rehabilitation

## 2020-05-24 ENCOUNTER — Other Ambulatory Visit: Payer: Self-pay

## 2020-05-24 VITALS — BP 123/80 | HR 75

## 2020-05-24 DIAGNOSIS — M5416 Radiculopathy, lumbar region: Secondary | ICD-10-CM

## 2020-05-24 MED ORDER — BETAMETHASONE SOD PHOS & ACET 6 (3-3) MG/ML IJ SUSP
12.0000 mg | Freq: Once | INTRAMUSCULAR | Status: AC
  Administered 2020-05-24: 12 mg

## 2020-05-24 NOTE — Progress Notes (Signed)
Pt state lower back pain. Pt state sitting, walking and standing for a long period of time makes the pain worse. Pt state she takes pain meds to help ease the pain.  Numeric Pain Rating Scale and Functional Assessment Average Pain 10   In the last MONTH (on 0-10 scale) has pain interfered with the following?  1. General activity like being  able to carry out your everyday physical activities such as walking, climbing stairs, carrying groceries, or moving a chair?  Rating(10)   +Driver, -BT, -Dye Allergies.

## 2020-05-24 NOTE — Progress Notes (Signed)
Michele Gross - 64 y.o. female MRN [id number]  Date of birth: 1956-02-14  Office Visit Note: Visit Date: 05/24/2020 PCP: Pcp, No Referred by: Cammy Copa, MD  Subjective: Chief Complaint  Patient presents with  . Lower Back - Pain   HPI:  Michele Gross is a 64 y.o. female who comes in today at the request of Dr. Burnard Bunting for planned Left L5-S1 Lumbar epidural steroid injection with fluoroscopic guidance.  The patient has failed conservative care including home exercise, medications, time and activity modification.  This injection will be diagnostic and hopefully therapeutic.  Please see requesting physician notes for further details and justification.  MRI reviewed with images and spine model.  MRI reviewed in the note below.   ROS Otherwise per HPI.  Assessment & Plan: Visit Diagnoses:    ICD-10-CM   1. Lumbar radiculopathy  M54.16 XR C-ARM NO REPORT    Epidural Steroid injection    betamethasone acetate-betamethasone sodium phosphate (CELESTONE) injection 12 mg    Plan: No additional findings.   Meds & Orders:  Meds ordered this encounter  Medications  . betamethasone acetate-betamethasone sodium phosphate (CELESTONE) injection 12 mg    Orders Placed This Encounter  Procedures  . XR C-ARM NO REPORT  . Epidural Steroid injection    Follow-up: Return if symptoms worsen or fail to improve.   Procedures: No procedures performed  Lumbar Epidural Steroid Injection - Interlaminar Approach with Fluoroscopic Guidance  Patient: Michele Gross      Date of Birth: 1955/12/20 MRN: [id number] PCP: Pcp, No      Visit Date: 05/24/2020   Universal Protocol:     Consent Given By: the patient  Position: PRONE  Additional Comments: Vital signs were monitored before and after the procedure. Patient was prepped and draped in the usual sterile fashion. The correct patient, procedure, and site was verified.   Injection Procedure Details:   Procedure  diagnoses: Lumbar radiculopathy [M54.16]   Meds Administered:  Meds ordered this encounter  Medications  . betamethasone acetate-betamethasone sodium phosphate (CELESTONE) injection 12 mg     Laterality: Left  Location/Site:  L5-S1  Needle: 4.5 in., 20 ga. Tuohy  Needle Placement: Paramedian epidural  Findings:   -Comments: Excellent flow of contrast into the epidural space.  Procedure Details: Using a paramedian approach from the side mentioned above, the region overlying the inferior lamina was localized under fluoroscopic visualization and the soft tissues overlying this structure were infiltrated with 4 ml. of 1% Lidocaine without Epinephrine. The Tuohy needle was inserted into the epidural space using a paramedian approach.   The epidural space was localized using loss of resistance along with counter oblique bi-planar fluoroscopic views.  After negative aspirate for air, blood, and CSF, a 2 ml. volume of Isovue-250 was injected into the epidural space and the flow of contrast was observed. Radiographs were obtained for documentation purposes.    The injectate was administered into the level noted above.   Additional Comments:  The patient tolerated the procedure well Dressing: 2 x 2 sterile gauze and Band-Aid    Post-procedure details: Patient was observed during the procedure. Post-procedure instructions were reviewed.  Patient left the clinic in stable condition.    Clinical History: MRI LUMBAR SPINE WITHOUT CONTRAST  TECHNIQUE: Multiplanar, multisequence MR imaging of the lumbar spine was performed. No intravenous contrast was administered.  COMPARISON:  MRI of the lumbar spine February 14, 1999. Plain films March 19, 2020.  FINDINGS: Segmentation:  Standard.  Alignment: Minimal anterolisthesis of L4 over L5, degenerative.  Vertebrae:  No fracture, evidence of discitis, or bone lesion.  Conus medullaris and cauda equina: Conus extends to the L1  level. Conus and cauda equina appear normal.  Paraspinal and other soft tissues: Negative.  Disc levels:  T11-12: Small posterior disc protrusion. No spinal canal or neural foraminal stenosis.  T12-L1: No spinal canal or neural foraminal stenosis.  L1-2: Shallow disc bulge and mild facet degenerative changes without significant spinal canal or neural foraminal stenosis.  L2-3: Shallow disc bulge and mild facet degenerative changes without significant spinal canal or neural foraminal stenosis.  L3-4: Disc bulge, facet degenerative changes and ligamentum flavum redundancy resulting in mild spinal canal stenosis. No significant neural foraminal narrowing.  L4-5: Disc bulge, prominent hypertrophic facet degenerative changes and ligamentum flavum redundancy resulting in mild spinal canal stenosis with narrowing of the bilateral subarticular zones. No significant neural foraminal narrowing.  L5-S1: Prominent hypertrophic facet degenerative changes. No spinal canal or neural foraminal stenosis.  IMPRESSION: 1. Multilevel degenerative changes of the lumbar spine as described above, more pronounced at the level of the facet joints at L3-4, L4-5 and L5-S1. 2. Mild spinal canal stenosis at L3-L4 and L4-L5. 3. No high-grade spinal canal or neural foraminal narrowing at any level.   Electronically Signed   By: Baldemar Lenis M.D.   On: 04/15/2020 14:10     Objective:  VS:  HT:    WT:   BMI:     BP:123/80  HR:75bpm  TEMP: ( )  RESP:  Physical Exam Vitals and nursing note reviewed.  Constitutional:      General: She is not in acute distress.    Appearance: Normal appearance. She is not ill-appearing.  HENT:     Head: Normocephalic and atraumatic.     Right Ear: External ear normal.     Left Ear: External ear normal.  Eyes:     Extraocular Movements: Extraocular movements intact.  Cardiovascular:     Rate and Rhythm: Normal rate.     Pulses:  Normal pulses.  Pulmonary:     Effort: Pulmonary effort is normal. No respiratory distress.  Abdominal:     General: There is no distension.     Palpations: Abdomen is soft.  Musculoskeletal:        General: Tenderness present.     Cervical back: Neck supple.     Right lower leg: No edema.     Left lower leg: No edema.     Comments: Patient has good distal strength with no pain over the greater trochanters.  No clonus or focal weakness.  Skin:    Findings: No erythema, lesion or rash.  Neurological:     General: No focal deficit present.     Mental Status: She is alert and oriented to person, place, and time.     Sensory: No sensory deficit.     Motor: No weakness or abnormal muscle tone.     Coordination: Coordination normal.  Psychiatric:        Mood and Affect: Mood normal.        Behavior: Behavior normal.      Imaging: No results found.

## 2020-05-24 NOTE — Procedures (Signed)
Lumbar Epidural Steroid Injection - Interlaminar Approach with Fluoroscopic Guidance  Patient: Michele Gross      Date of Birth: 11/29/1955 MRN: [id number] PCP: Pcp, No      Visit Date: 05/24/2020   Universal Protocol:     Consent Given By: the patient  Position: PRONE  Additional Comments: Vital signs were monitored before and after the procedure. Patient was prepped and draped in the usual sterile fashion. The correct patient, procedure, and site was verified.   Injection Procedure Details:   Procedure diagnoses: Lumbar radiculopathy [M54.16]   Meds Administered:  Meds ordered this encounter  Medications  . betamethasone acetate-betamethasone sodium phosphate (CELESTONE) injection 12 mg     Laterality: Left  Location/Site:  L5-S1  Needle: 4.5 in., 20 ga. Tuohy  Needle Placement: Paramedian epidural  Findings:   -Comments: Excellent flow of contrast into the epidural space.  Procedure Details: Using a paramedian approach from the side mentioned above, the region overlying the inferior lamina was localized under fluoroscopic visualization and the soft tissues overlying this structure were infiltrated with 4 ml. of 1% Lidocaine without Epinephrine. The Tuohy needle was inserted into the epidural space using a paramedian approach.   The epidural space was localized using loss of resistance along with counter oblique bi-planar fluoroscopic views.  After negative aspirate for air, blood, and CSF, a 2 ml. volume of Isovue-250 was injected into the epidural space and the flow of contrast was observed. Radiographs were obtained for documentation purposes.    The injectate was administered into the level noted above.   Additional Comments:  The patient tolerated the procedure well Dressing: 2 x 2 sterile gauze and Band-Aid    Post-procedure details: Patient was observed during the procedure. Post-procedure instructions were reviewed.  Patient left the clinic in  stable condition.

## 2020-05-29 ENCOUNTER — Other Ambulatory Visit: Payer: Self-pay | Admitting: Student

## 2020-05-29 DIAGNOSIS — Z1231 Encounter for screening mammogram for malignant neoplasm of breast: Secondary | ICD-10-CM

## 2020-08-30 ENCOUNTER — Ambulatory Visit: Payer: Medicare HMO

## 2020-09-12 ENCOUNTER — Other Ambulatory Visit: Payer: Self-pay | Admitting: Nurse Practitioner

## 2020-09-12 DIAGNOSIS — K7402 Hepatic fibrosis, advanced fibrosis: Secondary | ICD-10-CM

## 2020-09-19 ENCOUNTER — Ambulatory Visit
Admission: RE | Admit: 2020-09-19 | Discharge: 2020-09-19 | Disposition: A | Discharge status: Home / Self Care | Payer: Medicare HMO | Source: Ambulatory Visit | Attending: Nurse Practitioner | Admitting: Nurse Practitioner

## 2020-09-19 DIAGNOSIS — K7402 Hepatic fibrosis, advanced fibrosis: Secondary | ICD-10-CM

## 2021-01-15 ENCOUNTER — Other Ambulatory Visit: Payer: Self-pay | Admitting: Student

## 2021-01-15 ENCOUNTER — Ambulatory Visit
Admission: RE | Admit: 2021-01-15 | Discharge: 2021-01-15 | Disposition: A | Discharge status: Home / Self Care | Payer: Medicare Other | Source: Ambulatory Visit | Attending: Student | Admitting: Student

## 2021-01-15 ENCOUNTER — Other Ambulatory Visit: Payer: Self-pay

## 2021-01-15 DIAGNOSIS — M79671 Pain in right foot: Secondary | ICD-10-CM

## 2021-01-15 DIAGNOSIS — R609 Edema, unspecified: Secondary | ICD-10-CM

## 2021-01-15 DIAGNOSIS — R519 Headache, unspecified: Secondary | ICD-10-CM

## 2021-01-22 ENCOUNTER — Other Ambulatory Visit: Payer: Self-pay

## 2021-01-22 ENCOUNTER — Ambulatory Visit
Admission: RE | Admit: 2021-01-22 | Discharge: 2021-01-22 | Disposition: A | Discharge status: Home / Self Care | Payer: Medicare Other | Source: Ambulatory Visit | Attending: Student | Admitting: Student

## 2021-01-22 DIAGNOSIS — R519 Headache, unspecified: Secondary | ICD-10-CM

## 2021-01-24 ENCOUNTER — Other Ambulatory Visit: Payer: Medicare Other

## 2021-02-27 ENCOUNTER — Ambulatory Visit
Admission: RE | Admit: 2021-02-27 | Discharge: 2021-02-27 | Disposition: A | Discharge status: Home / Self Care | Payer: Medicare Other | Source: Ambulatory Visit | Attending: Student | Admitting: Student

## 2021-02-27 ENCOUNTER — Other Ambulatory Visit: Payer: Self-pay | Admitting: Student

## 2021-02-27 DIAGNOSIS — M5441 Lumbago with sciatica, right side: Secondary | ICD-10-CM

## 2021-09-10 ENCOUNTER — Encounter (HOSPITAL_COMMUNITY): Payer: Self-pay

## 2021-09-10 ENCOUNTER — Ambulatory Visit (HOSPITAL_COMMUNITY)
Admission: EM | Admit: 2021-09-10 | Discharge: 2021-09-10 | Disposition: A | Discharge status: Home / Self Care | Payer: Medicare Other | Attending: Internal Medicine | Admitting: Internal Medicine

## 2021-09-10 DIAGNOSIS — R109 Unspecified abdominal pain: Secondary | ICD-10-CM | POA: Insufficient documentation

## 2021-09-10 LAB — POCT URINALYSIS DIPSTICK, ED / UC
Bilirubin Urine: NEGATIVE
Glucose, UA: NEGATIVE mg/dL
Hgb urine dipstick: NEGATIVE
Ketones, ur: NEGATIVE mg/dL
Nitrite: NEGATIVE
Protein, ur: NEGATIVE mg/dL
Specific Gravity, Urine: 1.025 (ref 1.005–1.030)
Urobilinogen, UA: 0.2 mg/dL (ref 0.0–1.0)
pH: 5.5 (ref 5.0–8.0)

## 2021-09-10 NOTE — Discharge Instructions (Addendum)
Please take pain medications as we discussed ?If you have worsening pain please go to the emergency room for further evaluation ?Please follow through with colon evaluation as scheduled ?You may return to urgent care if you have any other concerns. ?We will call you with recommendations if labs are abnormal. ?

## 2021-09-10 NOTE — ED Triage Notes (Signed)
Pt presents with localized R side flank pain x 2 weeks.  ? ?Reports she has lower abdominal pain. Denies n/v/d. Denies fever and decrease in appetite.  ?

## 2021-09-10 NOTE — ED Provider Notes (Signed)
?[facility] ? ? ? ?CSN: [id number] ?Arrival date & time: 09/10/21  [id number] ? ? ?  ? ?History   ?Chief Complaint ?Chief Complaint  ?Patient presents with  ? Flank Pain  ? Abdominal Pain  ? ? ?HPI ?Michele Gross is a 66 y.o. female comes to the urgent care with a 2-week history of right flank pain.  Patient says that the pain is sharp, currently 8 out of 10, aggravated by movement and palpation with no known relieving factors.  Patient denies constipation or diarrhea.  Pain does not radiate into the groin or the lower extremity.  No numbness or tingling.  Patient denies any trauma or falls.  Patient is currently trying to lose weight and she is lost about 60 pounds in the past 6 months.  She is scheduled to have Cologuard today and colonoscopy if indicated.  No fever or chills.  No dysuria urgency or frequency.  Patient has tried over-the-counter ibuprofen and Vicodin in an inconsistent fashion. ? ?HPI ? ?Past Medical History:  ?Diagnosis Date  ? Chest pain   ? Negative nuclear stress 2007, negative stress echo 2011, reported negative cath 2000; negative stress echo November 2013  ? Chronic shoulder pain   ? DJD (degenerative joint disease)   ? GERD (gastroesophageal reflux disease)   ? Hypertension   ? Obesity   ? Sleep apnea   ? ? ?Patient Active Problem List  ? Diagnosis Date Noted  ? Cervical radiculopathy due to degenerative joint disease of spine 05/02/2012  ? Chest pain, atypical 05/02/2012  ? HYPERLIPIDEMIA 07/01/2007  ? GERD 07/01/2007  ? BACK PAIN 07/01/2007  ? ? ?Past Surgical History:  ?Procedure Laterality Date  ? PARTIAL HYSTERECTOMY    ? ? ?OB History   ?No obstetric history on file. ?  ? ? ? ?Home Medications   ? ?Prior to Admission medications   ?Medication Sig Start Date End Date Taking? Authorizing Provider  ?acetaminophen-codeine (TYLENOL #3) 300-30 MG tablet Take 1 tablet by mouth every 6 (six) hours as needed for moderate pain. 03/19/20   Cammy Copaean, Gregory Scott, MD  ?albuterol (PROVENTIL  HFA;VENTOLIN HFA) 108 (90 Base) MCG/ACT inhaler Inhale 2 puffs into the lungs every 4 (four) hours as needed for wheezing or shortness of breath (cough, shortness of breath or wheezing.). 08/10/17   Elvina SidleLauenstein, Kurt, MD  ?aspirin EC 81 MG EC tablet Take 1 tablet (81 mg total) by mouth daily. 05/03/12   Kristie CowmanSchooler, Karen, MD  ?celecoxib (CELEBREX) 100 MG capsule Take 1 capsule (100 mg total) by mouth 2 (two) times daily. 01/08/19   Dione BoozeGlick, David, MD  ?fluticasone Aleda Grana(FLONASE) 50 MCG/ACT nasal spray Place into both nostrils daily.    [provider]  ?gabapentin (NEURONTIN) 100 MG capsule Take 100 mg by mouth 3 (three) times daily.    [provider]  ?HYDROcodone-acetaminophen (NORCO/VICODIN) 5-325 MG tablet Take 1 tablet by mouth every 6 (six) hours as needed for moderate pain.    [provider]  ?lidocaine (XYLOCAINE) 5 % ointment Apply 1 application topically as needed. 10/29/19   Darr, Gerilyn PilgrimJacob, PA-C  ?LORazepam (ATIVAN) 1 MG tablet Take 1 tablet (1 mg total) by mouth 3 (three) times daily as needed for anxiety. 07/04/19   Mannie StabileAberman, Caroline C, PA-C  ?meloxicam (MOBIC) 15 MG tablet Take 15 mg by mouth daily.    [provider]  ?methocarbamol (ROBAXIN) 500 MG tablet Take 1 tablet (500 mg total) by mouth every 8 (eight) hours as needed for  muscle spasms. 03/19/20   Cammy Copa, MD  ?methylPREDNISolone (MEDROL) 4 MG tablet Take 1 tablet (4 mg total) by mouth daily. 03/19/20   Cammy Copa, MD  ?metoprolol tartrate (LOPRESSOR) 25 MG tablet Take 0.5 tablets (12.5 mg total) by mouth 2 (two) times daily. TAKE 1/2 TABLET(12.5 MG) BY MOUTH TWICE DAILY 01/11/20 02/10/20  Linus Mako B, NP  ?naproxen (NAPROSYN) 500 MG tablet Take 1 tablet (500 mg total) by mouth 2 (two) times daily. 01/06/20   Georgetta Haber, NP  ? ? ?Family History ?Family History  ?Problem Relation Age of Onset  ? Cancer Father   ?     colon and later "throat;" in remission  ? ? ?Social History ?Social History   ? ?Tobacco Use  ? Smoking status: Never  ? Smokeless tobacco: Never  ?Vaping Use  ? Vaping Use: Never used  ?Substance Use Topics  ? Alcohol use: Yes  ?  Alcohol/week: 2.0 standard drinks  ?  Types: 2 Glasses of wine per week  ?  Comment: occassional  ? Drug use: No  ? ? ? ?Allergies   ?Percocet [oxycodone-acetaminophen] ? ? ?Review of Systems ?Review of Systems  ?HENT: Negative.    ?Respiratory: Negative.    ?Gastrointestinal:  Positive for abdominal pain. Negative for blood in stool, constipation, diarrhea, nausea and vomiting.  ?Genitourinary: Negative.   ?Neurological: Negative.   ? ? ?Physical Exam ?Triage Vital Signs ?ED Triage Vitals  ?Enc Vitals Group  ?   BP 09/10/21 1019 114/75  ?   Pulse Rate 09/10/21 1019 69  ?   Resp 09/10/21 1019 16  ?   Temp 09/10/21 1019 98.5 ?F (36.9 ?C)  ?   Temp Source 09/10/21 1019 Oral  ?   SpO2 09/10/21 1019 100 %  ?   Weight --   ?   Height --   ?   Head Circumference --   ?   Peak Flow --   ?   Pain Score 09/10/21 1018 5  ?   Pain Loc --   ?   Pain Edu? --   ?   Excl. in GC? --   ? ?No data found. ? ?Updated Vital Signs ?BP 114/75 (BP Location: Right Arm)   Pulse 69   Temp 98.5 ?F (36.9 ?C) (Oral)   Resp 16   SpO2 100%  ? ?Visual Acuity ?Right Eye Distance:   ?Left Eye Distance:   ?Bilateral Distance:   ? ?Right Eye Near:   ?Left Eye Near:    ?Bilateral Near:    ? ?Physical Exam ?Vitals and nursing note reviewed.  ?Constitutional:   ?   General: She is not in acute distress. ?   Appearance: She is not ill-appearing.  ?Cardiovascular:  ?   Rate and Rhythm: Normal rate and regular rhythm.  ?Abdominal:  ?   General: Bowel sounds are normal.  ?   Palpations: Abdomen is soft. There is no hepatomegaly or splenomegaly.  ?   Tenderness: There is abdominal tenderness. There is no guarding or rebound. Negative signs include Murphy's sign and Rovsing's sign.  ?Neurological:  ?   Mental Status: She is alert.  ? ? ? ?UC Treatments / Results  ?Labs ?(all labs ordered are listed, but  only abnormal results are displayed) ?Labs Reviewed  ?POCT URINALYSIS DIPSTICK, ED / UC - Abnormal; Notable for the following components:  ?    Result Value  ? Leukocytes,Ua TRACE (*)   ? All other  components within normal limits  ?URINE CULTURE  ? ? ?EKG ? ? ?Radiology ?No results found. ? ?Procedures ?Procedures (including critical care time) ? ?Medications Ordered in UC ?Medications - No data to display ? ?Initial Impression / Assessment and Plan / UC Course  ?I have reviewed the triage vital signs and the nursing notes. ? ?Pertinent labs & imaging results that were available during my care of the patient were reviewed by me and considered in my medical decision making (see chart for details). ? ?  ? ?1.  Abdominal wall pain in the right flank: ?Ibuprofen as needed for pain ?Gentle range of motion exercises ?Patient has Vicodin which she could also use for breakthrough pain ?I encouraged the patient to pursue the Cologuard studies as well as a possible colonoscopy ?If pain worsens patient is advised to go to the emergency department for further evaluation. ?Final Clinical Impressions(s) / UC Diagnoses  ? ?Final diagnoses:  ?Abdominal wall pain in right flank  ? ? ? ?Discharge Instructions   ? ?  ?Please take pain medications as we discussed ?If you have worsening pain please go to the emergency room for further evaluation ?Please follow through with colon evaluation as scheduled ?You may return to urgent care if you have any other concerns. ?We will call you with recommendations if labs are abnormal. ? ? ? ?ED Prescriptions   ?None ?  ? ?PDMP not reviewed this encounter. ?  ?Merrilee Jansky, MD ?09/10/21 1211 ? ?

## 2021-09-13 LAB — URINE CULTURE: Culture: 50000 — AB

## 2021-09-14 ENCOUNTER — Emergency Department (HOSPITAL_COMMUNITY): Payer: Medicare Other

## 2021-09-14 ENCOUNTER — Other Ambulatory Visit: Payer: Self-pay

## 2021-09-14 ENCOUNTER — Emergency Department (HOSPITAL_COMMUNITY)
Admission: EM | Admit: 2021-09-14 | Discharge: 2021-09-14 | Disposition: A | Discharge status: Home / Self Care | Payer: Medicare Other | Attending: Emergency Medicine | Admitting: Emergency Medicine

## 2021-09-14 ENCOUNTER — Encounter (HOSPITAL_COMMUNITY): Payer: Self-pay | Admitting: Emergency Medicine

## 2021-09-14 DIAGNOSIS — R1031 Right lower quadrant pain: Secondary | ICD-10-CM | POA: Diagnosis not present

## 2021-09-14 DIAGNOSIS — R109 Unspecified abdominal pain: Secondary | ICD-10-CM

## 2021-09-14 DIAGNOSIS — R1011 Right upper quadrant pain: Secondary | ICD-10-CM | POA: Insufficient documentation

## 2021-09-14 DIAGNOSIS — Z7982 Long term (current) use of aspirin: Secondary | ICD-10-CM | POA: Diagnosis not present

## 2021-09-14 LAB — URINALYSIS, ROUTINE W REFLEX MICROSCOPIC
Bilirubin Urine: NEGATIVE
Glucose, UA: NEGATIVE mg/dL
Ketones, ur: NEGATIVE mg/dL
Nitrite: NEGATIVE
Protein, ur: NEGATIVE mg/dL
Specific Gravity, Urine: 1.023 (ref 1.005–1.030)
pH: 5 (ref 5.0–8.0)

## 2021-09-14 LAB — COMPREHENSIVE METABOLIC PANEL
ALT: 12 U/L (ref 0–44)
AST: 14 U/L — ABNORMAL LOW (ref 15–41)
Albumin: 3.6 g/dL (ref 3.5–5.0)
Alkaline Phosphatase: 74 U/L (ref 38–126)
Anion gap: 3 — ABNORMAL LOW (ref 5–15)
BUN: 16 mg/dL (ref 8–23)
CO2: 25 mmol/L (ref 22–32)
Calcium: 9.4 mg/dL (ref 8.9–10.3)
Chloride: 114 mmol/L — ABNORMAL HIGH (ref 98–111)
Creatinine, Ser: 0.74 mg/dL (ref 0.44–1.00)
GFR, Estimated: >60 mL/min (ref >60)
Glucose, Bld: 107 mg/dL — ABNORMAL HIGH (ref 70–99)
Potassium: 3.3 mmol/L — ABNORMAL LOW (ref 3.5–5.1)
Sodium: 142 mmol/L (ref 135–145)
Total Bilirubin: 0.7 mg/dL (ref 0.3–1.2)
Total Protein: 7.4 g/dL (ref 6.5–8.1)

## 2021-09-14 LAB — CBC
HCT: 42.3 % (ref 36.0–46.0)
Hemoglobin: 13.3 g/dL (ref 12.0–15.0)
MCH: 27.7 pg (ref 26.0–34.0)
MCHC: 31.4 g/dL (ref 30.0–36.0)
MCV: 88.1 fL (ref 80.0–100.0)
Platelets: 274 10*3/uL (ref 150–400)
RBC: 4.8 MIL/uL (ref 3.87–5.11)
RDW: 15.2 % (ref 11.5–15.5)
WBC: 8.3 10*3/uL (ref 4.0–10.5)
nRBC: 0 % (ref 0.0–0.2)

## 2021-09-14 LAB — LIPASE, BLOOD: Lipase: 37 U/L (ref 11–51)

## 2021-09-14 MED ORDER — IOHEXOL 350 MG/ML SOLN
80.0000 mL | Freq: Once | INTRAVENOUS | Status: AC | PRN
  Administered 2021-09-14: 80 mL via INTRAVENOUS

## 2021-09-14 MED ORDER — KETOROLAC TROMETHAMINE 30 MG/ML IJ SOLN
30.0000 mg | Freq: Once | INTRAMUSCULAR | Status: AC
  Administered 2021-09-14: 30 mg via INTRAVENOUS
  Filled 2021-09-14: qty 1

## 2021-09-14 NOTE — ED Provider Notes (Signed)
?[facility] ?Provider Note ? ? ?CSN: [id number] ?Arrival date & time: 09/14/21  0830 ? ?  ? ?History ? ?Chief Complaint  ?Patient presents with  ? Abdominal Pain  ? ? ?Michele Gross is a 66 y.o. female who presents to the emergency department complaining of right sided abdominal pain ongoing 3 weeks.  She notes that her abdominal pain has been constant and worsening.  She has been evaluated by her primary care provider in urgent care for her symptoms.  She has not tried any medications for her symptoms.  She was evaluated in the ED and started on antibiotic for presumptive UTI.  She discontinued taking the antibiotic because she thought that was the cause of her abdominal pain.  Patient denies chest pain, shortness of breath, fever, chills, dysuria, hematuria, diarrhea, constipation, nausea, vomiting, vaginal bleeding, vaginal discharge. ? ? ?Per patient chart review: Patient was evaluated at urgent care on 09/10/2021 for similar concerns.  At that time she had urinalysis completed with a urine culture sent. ? ? ? ?The history is provided by the patient. No language interpreter was used.  ? ?  ? ?Home Medications ?Prior to Admission medications   ?Medication Sig Start Date End Date Taking? Authorizing Provider  ?acetaminophen-codeine (TYLENOL #3) 300-30 MG tablet Take 1 tablet by mouth every 6 (six) hours as needed for moderate pain. 03/19/20   Meredith Pel, MD  ?albuterol (PROVENTIL HFA;VENTOLIN HFA) 108 (90 Base) MCG/ACT inhaler Inhale 2 puffs into the lungs every 4 (four) hours as needed for wheezing or shortness of breath (cough, shortness of breath or wheezing.). 08/10/17   Robyn Haber, MD  ?aspirin EC 81 MG EC tablet Take 1 tablet (81 mg total) by mouth daily. 05/03/12   Dorian Heckle, MD  ?celecoxib (CELEBREX) 100 MG capsule Take 1 capsule (100 mg total) by mouth 2 (two) times daily. [id number]   Delora Fuel, MD  ?fluticasone Asencion Islam) 50 MCG/ACT nasal spray  Place into both nostrils daily.    [provider]  ?gabapentin (NEURONTIN) 100 MG capsule Take 100 mg by mouth 3 (three) times daily.    [provider]  ?HYDROcodone-acetaminophen (NORCO/VICODIN) 5-325 MG tablet Take 1 tablet by mouth every 6 (six) hours as needed for moderate pain.    [provider]  ?lidocaine (XYLOCAINE) 5 % ointment Apply 1 application topically as needed. 10/29/19   Darr, Edison Nasuti, PA-C  ?LORazepam (ATIVAN) 1 MG tablet Take 1 tablet (1 mg total) by mouth 3 (three) times daily as needed for anxiety. 07/04/19   Suzy Bouchard, PA-C  ?meloxicam (MOBIC) 15 MG tablet Take 15 mg by mouth daily.    [provider]  ?methocarbamol (ROBAXIN) 500 MG tablet Take 1 tablet (500 mg total) by mouth every 8 (eight) hours as needed for muscle spasms. 03/19/20   Meredith Pel, MD  ?methylPREDNISolone (MEDROL) 4 MG tablet Take 1 tablet (4 mg total) by mouth daily. 03/19/20   Meredith Pel, MD  ?metoprolol tartrate (LOPRESSOR) 25 MG tablet Take 0.5 tablets (12.5 mg total) by mouth 2 (two) times daily. TAKE 1/2 TABLET(12.5 MG) BY MOUTH TWICE DAILY 01/11/20 02/10/20  Augusto Gamble B, NP  ?naproxen (NAPROSYN) 500 MG tablet Take 1 tablet (500 mg total) by mouth 2 (two) times daily. 01/06/20   Zigmund Gottron, NP  ?   ? ?Allergies    ?Percocet [oxycodone-acetaminophen]   ? ?Review of Systems   ?Review of Systems  ?Constitutional:  Negative for  chills and fever.  ?Respiratory:  Negative for shortness of breath.   ?Cardiovascular:  Negative for chest pain.  ?Gastrointestinal:  Positive for abdominal pain. Negative for constipation, diarrhea, nausea and vomiting.  ?Genitourinary:  Negative for dysuria, hematuria, vaginal bleeding and vaginal discharge.  ?Skin:  Negative for rash.  ?All other systems reviewed and are negative. ? ?Physical Exam ?Updated Vital Signs ?BP 109/74   Pulse 64   Temp 98.2 ?F (36.8 ?C) (Oral)   Resp 20   SpO2 100%  ?Physical Exam ?Vitals and  nursing note reviewed.  ?Constitutional:   ?   General: She is not in acute distress. ?   Appearance: She is not diaphoretic.  ?HENT:  ?   Head: Normocephalic and atraumatic.  ?   Mouth/Throat:  ?   Pharynx: No oropharyngeal exudate.  ?Eyes:  ?   General: No scleral icterus. ?   Conjunctiva/sclera: Conjunctivae normal.  ?Cardiovascular:  ?   Rate and Rhythm: Normal rate and regular rhythm.  ?   Pulses: Normal pulses.  ?   Heart sounds: Normal heart sounds.  ?Pulmonary:  ?   Effort: Pulmonary effort is normal. No respiratory distress.  ?   Breath sounds: Normal breath sounds. No wheezing.  ?Abdominal:  ?   General: Bowel sounds are normal.  ?   Palpations: Abdomen is soft. There is no mass.  ?   Tenderness: There is abdominal tenderness in the right upper quadrant and right lower quadrant. There is no guarding or rebound.  ?   Comments: No CVA tenderness noted bilaterally.  ?Musculoskeletal:     ?   General: Normal range of motion.  ?   Cervical back: Normal range of motion and neck supple.  ?   Comments: No spinal tenderness to palpation.  No tenderness to palpation noted to musculature of back.  ?Skin: ?   General: Skin is warm and dry.  ?Neurological:  ?   Mental Status: She is alert.  ?Psychiatric:     ?   Behavior: Behavior normal.  ? ? ?ED Results / Procedures / Treatments   ?Labs ?(all labs ordered are listed, but only abnormal results are displayed) ?Labs Reviewed  ?COMPREHENSIVE METABOLIC PANEL - Abnormal; Notable for the following components:  ?    Result Value  ? Potassium 3.3 (*)   ? Chloride 114 (*)   ? Glucose, Bld 107 (*)   ? AST 14 (*)   ? Anion gap 3 (*)   ? All other components within normal limits  ?URINALYSIS, ROUTINE W REFLEX MICROSCOPIC - Abnormal; Notable for the following components:  ? APPearance HAZY (*)   ? Hgb urine dipstick SMALL (*)   ? Leukocytes,Ua LARGE (*)   ? Bacteria, UA RARE (*)   ? All other components within normal limits  ?LIPASE, BLOOD  ?CBC  ? ? ?EKG ?None ? ?Radiology ?CT  ABDOMEN PELVIS W CONTRAST ? ?Result Date: 09/14/2021 ?CLINICAL DATA:  Right-sided abdominal pain. EXAM: CT ABDOMEN AND PELVIS WITH CONTRAST TECHNIQUE: Multidetector CT imaging of the abdomen and pelvis was performed using the standard protocol following bolus administration of intravenous contrast. RADIATION DOSE REDUCTION: This exam was performed according to the departmental dose-optimization program which includes automated exposure control, adjustment of the mA and/or kV according to patient size and/or use of iterative reconstruction technique. CONTRAST:  48mL OMNIPAQUE IOHEXOL 350 MG/ML SOLN COMPARISON:  04/19/2016 FINDINGS: Lower chest: No acute abnormality. Hepatobiliary: No focal liver abnormality is seen. No gallstones, gallbladder wall thickening,  or biliary dilatation. Pancreas: Unremarkable. No pancreatic ductal dilatation or surrounding inflammatory changes. Spleen: Normal in size without focal abnormality. Adrenals/Urinary Tract: Adrenal glands are unremarkable. Kidneys are normal, without renal calculi, focal lesion, or hydronephrosis. Bladder is unremarkable. Stomach/Bowel: Bowel shows no evidence of obstruction, ileus, inflammation or lesion. The appendix is normal. No free intraperitoneal air. Vascular/Lymphatic: No significant vascular findings are present. No enlarged abdominal or pelvic lymph nodes. Reproductive: Status post hysterectomy. No adnexal masses. Other: No abdominal wall hernia or abnormality. No abdominopelvic ascites. Musculoskeletal: No acute or significant osseous findings. IMPRESSION: No acute findings in the abdomen or pelvis. Electronically Signed   By: Aletta Edouard M.D.   On: 09/14/2021 11:35   ? ?Procedures ?Procedures  ? ? ?Medications Ordered in ED ?Medications  ?iohexol (OMNIPAQUE) 350 MG/ML injection 80 mL (80 mLs Intravenous Contrast Given 09/14/21 1119)  ?ketorolac (TORADOL) 30 MG/ML injection 30 mg (30 mg Intravenous Given 09/14/21 1129)  ? ? ?ED Course/ Medical  Decision Making/ A&P ?Clinical Course as of 09/14/21 1435  ?Sat Sep 14, 2021  ?1209 Discussed with patient imaging findings. Patient notes improvement of her symptoms with toradol in the ED.  [SB]  ?  ?Clinical Course

## 2021-09-14 NOTE — Discharge Instructions (Addendum)
It was a pleasure taking care of you today! ? ?Your work-up today was unremarkable.  You may continue taking the antibiotic that you were prescribed for your previous UTI and make sure to complete the prescription.  You may also take over-the-counter 500 mg Tylenol every 6 hours or 600 mg ibuprofen every 6 hours as needed for pain for no more than 7 days.  Follow-up with your primary care provider regarding today's ED visit.  Return to the emergency department if you are experiencing increasing/worsening abdominal pain, vomiting, worsening symptoms. ?

## 2021-09-14 NOTE — ED Triage Notes (Signed)
C/o constant lower abd pain and R lateral side pain x 3 weeks. Denies nausea, vomiting, diarrhea, and urinary complaints.  States she has been seen x 2 for same. ?

## 2021-09-14 NOTE — ED Notes (Signed)
Patient transported to CT 

## 2021-10-21 ENCOUNTER — Encounter: Payer: Self-pay | Admitting: Gastroenterology

## 2021-10-22 ENCOUNTER — Other Ambulatory Visit: Payer: Self-pay | Admitting: Student

## 2021-10-22 ENCOUNTER — Ambulatory Visit
Admission: RE | Admit: 2021-10-22 | Discharge: 2021-10-22 | Disposition: A | Discharge status: Home / Self Care | Payer: Medicare Other | Source: Ambulatory Visit | Attending: Student | Admitting: Student

## 2021-10-22 DIAGNOSIS — M25551 Pain in right hip: Secondary | ICD-10-CM

## 2021-10-22 DIAGNOSIS — M5459 Other low back pain: Secondary | ICD-10-CM

## 2021-10-22 DIAGNOSIS — M25552 Pain in left hip: Secondary | ICD-10-CM

## 2021-10-31 ENCOUNTER — Ambulatory Visit (AMBULATORY_SURGERY_CENTER): Payer: Medicare Other | Admitting: *Deleted

## 2021-10-31 VITALS — Ht 63.0 in | Wt 230.0 lb

## 2021-10-31 DIAGNOSIS — Z1211 Encounter for screening for malignant neoplasm of colon: Secondary | ICD-10-CM

## 2021-10-31 MED ORDER — PEG 3350-KCL-NA BICARB-NACL 420 G PO SOLR: 4000.0000 mL | Freq: Once | ORAL | 0 refills | Status: AC

## 2021-10-31 NOTE — Progress Notes (Signed)
No egg or soy allergy known to patient  ?No issues known to pt with past sedation with any surgeries or procedures ?Patient denies ever being told they had issues or difficulty with intubation  ?No FH of Malignant Hyperthermia ?Pt is not on diet pills ?Pt is not on  home 02  ?Pt is not on blood thinners  ?Pt denies issues with constipation  ?No A fib or A flutter ?Pt very rushed during PV appt., kept saying she had no questions and just send the info. Went over prep instructions with pt, she repeated to just send the info to her. Said she was at her Drs office, did not want to reschedule PV. Information will be sent in the mail today. ? ?PV completed over the phone. Pt verified name, DOB, address and insurance during PV today.  ?Pt mailed instruction packet with copy of consent form to read and not return, and instructions.  ?Pt encouraged to call with questions or issues.  ?If pt has My chart, procedure instructions sent via My Chart  ?Insurance confirmed with pt at [facility] today   ?

## 2021-11-15 ENCOUNTER — Encounter: Payer: Self-pay | Admitting: Gastroenterology

## 2021-11-21 ENCOUNTER — Ambulatory Visit (AMBULATORY_SURGERY_CENTER): Payer: Medicare Other | Admitting: Gastroenterology

## 2021-11-21 ENCOUNTER — Encounter: Payer: Self-pay | Admitting: Gastroenterology

## 2021-11-21 VITALS — BP 125/81 | HR 66 | Temp 98.4°F | Resp 16 | Ht 63.0 in | Wt 230.0 lb

## 2021-11-21 DIAGNOSIS — K5730 Diverticulosis of large intestine without perforation or abscess without bleeding: Secondary | ICD-10-CM

## 2021-11-21 DIAGNOSIS — Z1211 Encounter for screening for malignant neoplasm of colon: Secondary | ICD-10-CM | POA: Diagnosis not present

## 2021-11-21 MED ORDER — SODIUM CHLORIDE 0.9 % IV SOLN: 500.0000 mL | Freq: Once | INTRAVENOUS | Status: DC

## 2021-11-21 NOTE — Progress Notes (Signed)
Report to PACU, RN, vss, BBS= Clear.  

## 2021-11-21 NOTE — Op Note (Signed)
[facility] Patient Name: Michele Gross Procedure Date: 11/21/2021 11:22 AM MRN: [id number] Endoscopist: Gerrit Heck , MD Age: 66 Referring MD:  Date of Birth: 24-Apr-1956 Gender: Female Account #: [id number] Procedure:                Colonoscopy Indications:              Screening for colorectal malignant neoplasm, This                            is the patient's first colonoscopy Medicines:                Monitored Anesthesia Care Procedure:                Pre-Anesthesia Assessment:                           - Prior to the procedure, a History and Physical                            was performed, and patient medications and                            allergies were reviewed. The patient's tolerance of                            previous anesthesia was also reviewed. The risks                            and benefits of the procedure and the sedation                            options and risks were discussed with the patient.                            All questions were answered, and informed consent                            was obtained. Prior Anticoagulants: The patient has                            taken no previous anticoagulant or antiplatelet                            agents. ASA Grade Assessment: III - A patient with                            severe systemic disease. After reviewing the risks                            and benefits, the patient was deemed in                            satisfactory condition to undergo the procedure.  After obtaining informed consent, the colonoscope                            was passed under direct vision. Throughout the                            procedure, the patient's blood pressure, pulse, and                            oxygen saturations were monitored continuously. The                            CF [id number] was introduced through the anus                            and advanced to  the the terminal ileum. The                            colonoscopy was performed without difficulty. The                            patient tolerated the procedure well. The quality                            of the bowel preparation was good. The terminal                            ileum, ileocecal valve, appendiceal orifice, and                            rectum were photographed. Scope In: 11:30:24 AM Scope Out: 11:41:36 AM Scope Withdrawal Time: 0 hours 9 minutes 32 seconds  Total Procedure Duration: 0 hours 11 minutes 12 seconds  Findings:                 The perianal and digital rectal examinations were                            normal.                           Multiple small and large-mouthed diverticula were                            found in the sigmoid colon.                           The exam was otherwise normal throughout the                            remainder of the colon.                           The retroflexed view of the distal rectum and anal  verge was normal and showed no anal or rectal                            abnormalities.                           The terminal ileum appeared normal. Complications:            No immediate complications. Estimated Blood Loss:     Estimated blood loss: none. Impression:               - Diverticulosis in the sigmoid colon.                           - The distal rectum and anal verge are normal on                            retroflexion view.                           - The examined portion of the ileum was normal.                           - No specimens collected. Recommendation:           - Patient has a contact number available for                            emergencies. The signs and symptoms of potential                            delayed complications were discussed with the                            patient. Return to normal activities tomorrow.                            Written discharge  instructions were provided to the                            patient.                           - Resume previous diet.                           - Continue present medications.                           - Repeat colonoscopy in 10 years for screening                            purposes.                           - Return to GI office PRN. Gerrit Heck, MD 11/21/2021 11:45:03 AM

## 2021-11-21 NOTE — Patient Instructions (Signed)
YOU HAD AN ENDOSCOPIC PROCEDURE TODAY AT THE [facility]:   Refer to the procedure report that was given to you for any specific questions about what was found during the examination.  If the procedure report does not answer your questions, please call your gastroenterologist to clarify.  If you requested that your care partner not be given the details of your procedure findings, then the procedure report has been included in a sealed envelope for you to review at your convenience later.  YOU SHOULD EXPECT: Some feelings of bloating in the abdomen. Passage of more gas than usual.  Walking can help get rid of the air that was put into your GI tract during the procedure and reduce the bloating. If you had a lower endoscopy (such as a colonoscopy or flexible sigmoidoscopy) you may notice spotting of blood in your stool or on the toilet paper. If you underwent a bowel prep for your procedure, you may not have a normal bowel movement for a few days.  Please Note:  You might notice some irritation and congestion in your nose or some drainage.  This is from the oxygen used during your procedure.  There is no need for concern and it should clear up in a day or so.  SYMPTOMS TO REPORT IMMEDIATELY:   Following lower endoscopy (colonoscopy or flexible sigmoidoscopy):  Excessive amounts of blood in the stool  Significant tenderness or worsening of abdominal pains  Swelling of the abdomen that is new, acute  Fever of 100F or higher   Following upper endoscopy (EGD)  Vomiting of blood or coffee ground material  New chest pain or pain under the shoulder blades  Painful or persistently difficult swallowing  New shortness of breath  Fever of 100F or higher  Black, tarry-looking stools  For urgent or emergent issues, a gastroenterologist can be reached at any hour by calling [phone]. Do not use MyChart messaging for urgent concerns.    DIET:  We do recommend a small meal at first, but  then you may proceed to your regular diet.  Drink plenty of fluids but you should avoid alcoholic beverages for 24 hours.  ACTIVITY:  You should plan to take it easy for the rest of today and you should NOT DRIVE or use heavy machinery until tomorrow (because of the sedation medicines used during the test).    FOLLOW UP: Our staff will call the number listed on your records 48-72 hours following your procedure to check on you and address any questions or concerns that you may have regarding the information given to you following your procedure. If we do not reach you, we will leave a message.  We will attempt to reach you two times.  During this call, we will ask if you have developed any symptoms of COVID 19. If you develop any symptoms (ie: fever, flu-like symptoms, shortness of breath, cough etc.) before then, please call [phone].  If you test positive for Covid 19 in the 2 weeks post procedure, please call and report this information to us.    If any biopsies were taken you will be contacted by phone or by letter within the next 1-3 weeks.  Please call us at [phone] if you have not heard about the biopsies in 3 weeks.    SIGNATURES/CONFIDENTIALITY: You and/or your care partner have signed paperwork which will be entered into your electronic medical record.  These signatures attest to the fact that that the information above on   your After Visit Summary has been reviewed and is understood.  Full responsibility of the confidentiality of this discharge information lies with you and/or your care-partner. 

## 2021-11-21 NOTE — Progress Notes (Signed)
GASTROENTEROLOGY PROCEDURE H&P NOTE   Primary Care Physician: Raymon Mutton., FNP    Reason for Procedure:  Colon Cancer screening  Plan:    Colonoscopy  Patient is appropriate for endoscopic procedure(s) in the ambulatory ([facility]) setting.  The nature of the procedure, as well as the risks, benefits, and alternatives were carefully and thoroughly reviewed with the patient. Ample time for discussion and questions allowed. The patient understood, was satisfied, and agreed to proceed.     HPI: Michele Gross is a 66 y.o. female who presents for colonoscopy for routine Colon Cancer screening.  No active GI symptoms.      Past Medical History:  Diagnosis Date   Chest pain    Negative nuclear stress 2007, negative stress echo 2011, reported negative cath 2000; negative stress echo November 2013   Chronic shoulder pain    DJD (degenerative joint disease)    GERD (gastroesophageal reflux disease)    Hyperlipidemia    Hypertension    Obesity    Sleep apnea     Past Surgical History:  Procedure Laterality Date   PARTIAL HYSTERECTOMY      Prior to Admission medications   Medication Sig Start Date End Date Taking? Authorizing Provider  aspirin EC 81 MG EC tablet Take 1 tablet (81 mg total) by mouth daily. 05/03/12  Yes Kristie Cowman, MD  atorvastatin (LIPITOR) 10 MG tablet Take 10 mg by mouth daily. 08/26/21  Yes [provider]  HYDROcodone-acetaminophen (NORCO/VICODIN) 5-325 MG tablet Take 1 tablet by mouth every 6 (six) hours as needed for moderate pain.   Yes [provider]  metoprolol tartrate (LOPRESSOR) 25 MG tablet Take 0.5 tablets (12.5 mg total) by mouth 2 (two) times daily. TAKE 1/2 TABLET(12.5 MG) BY MOUTH TWICE DAILY 01/11/20 11/21/21 Yes Burky, Natalie B, NP  OZEMPIC, 1 MG/DOSE, 4 MG/3ML SOPN Inject into the skin. 10/28/21  Yes [provider]    Current Outpatient Medications  Medication Sig Dispense Refill   aspirin EC 81 MG EC  tablet Take 1 tablet (81 mg total) by mouth daily. 30 tablet 1   atorvastatin (LIPITOR) 10 MG tablet Take 10 mg by mouth daily.     HYDROcodone-acetaminophen (NORCO/VICODIN) 5-325 MG tablet Take 1 tablet by mouth every 6 (six) hours as needed for moderate pain.     metoprolol tartrate (LOPRESSOR) 25 MG tablet Take 0.5 tablets (12.5 mg total) by mouth 2 (two) times daily. TAKE 1/2 TABLET(12.5 MG) BY MOUTH TWICE DAILY 30 tablet 0   OZEMPIC, 1 MG/DOSE, 4 MG/3ML SOPN Inject into the skin.     Current Facility-Administered Medications  Medication Dose Route Frequency Provider Last Rate Last Admin   0.9 %  sodium chloride infusion  500 mL Intravenous Once Nikala Walsworth V, DO        Allergies as of 11/21/2021 - Review Complete 11/21/2021  Allergen Reaction Noted   Percocet [oxycodone-acetaminophen] Other (See Comments) 06/15/2015    Family History  Problem Relation Age of Onset   Cancer Father        colon and later "throat;" in remission   Colon polyps Neg Hx    Colon cancer Neg Hx    Esophageal cancer Neg Hx    Stomach cancer Neg Hx    Rectal cancer Neg Hx     Social History   Socioeconomic History   Marital status: Widowed    Spouse name: Not on file   Number of children: Not on file  Years of education: Not on file   Highest education level: Not on file  Occupational History   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  Tobacco Use   Smoking status: Never   Smokeless tobacco: Never  Vaping Use   Vaping Use: Never used  Substance and Sexual Activity   Alcohol use: Not Currently    Alcohol/week: 2.0 standard drinks    Types: 2 Glasses of wine per week   Drug use: Yes    Types: Marijuana   Sexual activity: Not Currently  Other Topics Concern   Not on file  Social History Narrative   Not on file   Social Determinants of Health   Financial Resource Strain: Not on file  Food Insecurity: Not on file  Transportation Needs:  Not on file  Physical Activity: Not on file  Stress: Not on file  Social Connections: Not on file  Intimate Partner Violence: Not on file    Physical Exam: Vital signs in last 24 hours: @BP  122/70   Pulse 71   Temp 98.4 F (36.9 C)   Resp 20   Ht 5\' 3"  (1.6 m)   Wt 230 lb (104.3 kg)   SpO2 98%   BMI 40.74 kg/m  GEN: NAD EYE: Sclerae anicteric ENT: MMM CV: Non-tachycardic Pulm: CTA b/l GI: Soft, NT/ND NEURO:  Alert & Oriented x 3   , DO [facility]   11/21/2021 11:23 AM \

## 2021-11-21 NOTE — Progress Notes (Signed)
Pt's states no medical or surgical changes since previsit or office visit. 

## 2021-11-22 ENCOUNTER — Telehealth: Payer: Self-pay | Admitting: *Deleted

## 2021-11-22 NOTE — Telephone Encounter (Signed)
  Follow up Call-     11/21/2021   10:42 AM  Call back number  Post procedure Call Back phone  # [phone]  Permission to leave phone message Yes     Patient questions:  Do you have a fever, pain , or abdominal swelling? No. Pain Score  0 *  Have you tolerated food without any problems? Yes.    Have you been able to return to your normal activities? Yes.    Do you have any questions about your discharge instructions: Diet   No. Medications  No. Follow up visit  No.  Do you have questions or concerns about your Care? No.  Actions: * If pain score is 4 or above: No action needed, pain <4.

## 2022-02-27 IMAGING — CT CT HEAD W/O CM
3 series · 14 of 47 positions shown, 16 images · non-contrast
Comparison: March 15, 2019

CLINICAL DATA: Dizziness.

EXAM:
CT HEAD WITHOUT CONTRAST
TECHNIQUE: Contiguous axial images were obtained from the base of the skull
through the vertex without intravenous contrast.

[Series 3: head 5.0 h30s · axial · 0.44mm/px · z∈[-143,+2]mm · 8 of 35 slices shown, 10 images]
[im 3/35  brain]
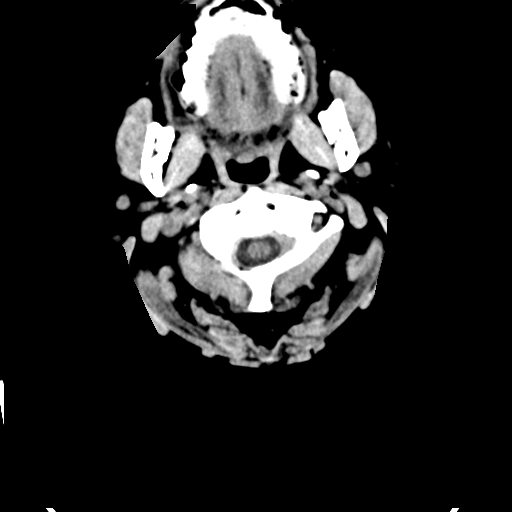
[im 3/35  bone]
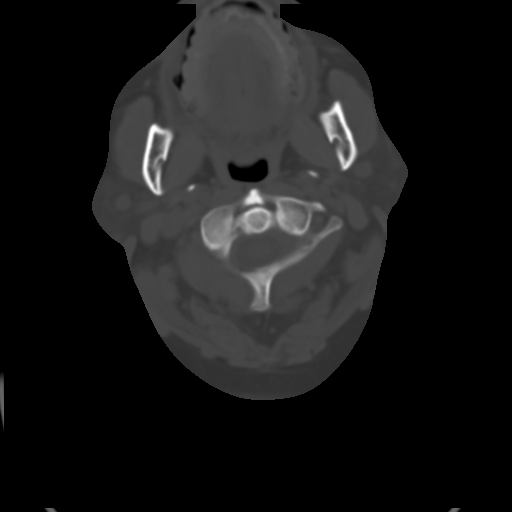
[im 8/35  brain]
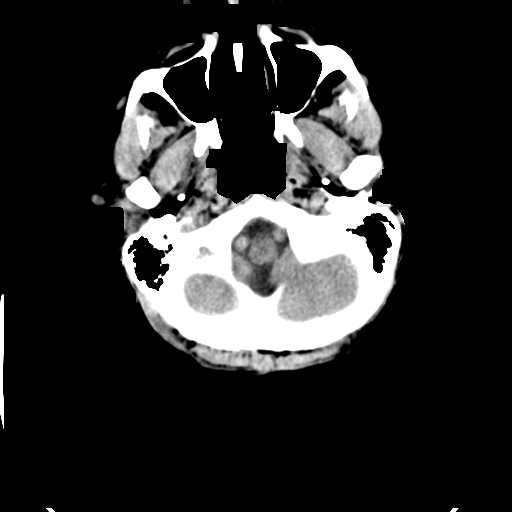
[im 11/35  brain]
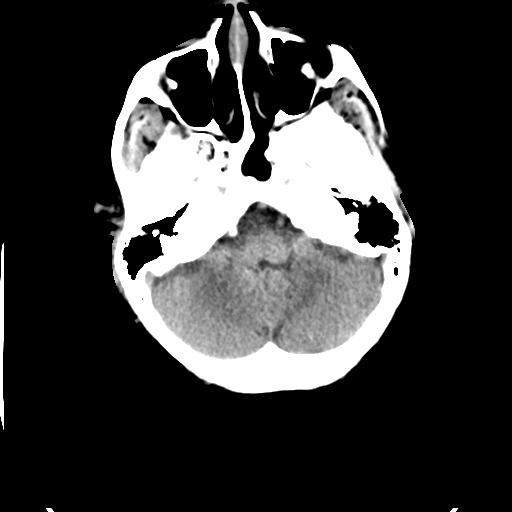
[im 16/35  brain]
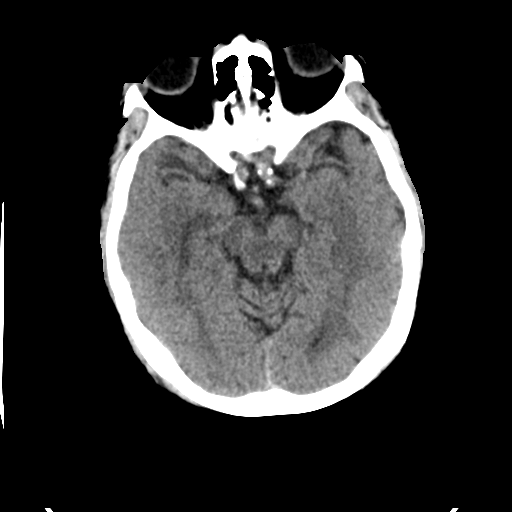
[im 19/35  brain]
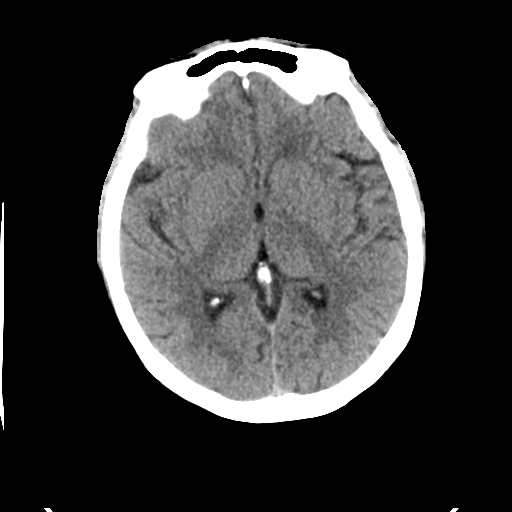
[im 19/35  bone]
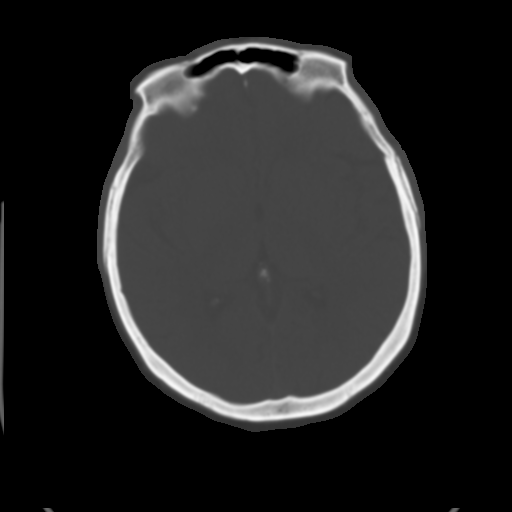
[im 24/35  brain]
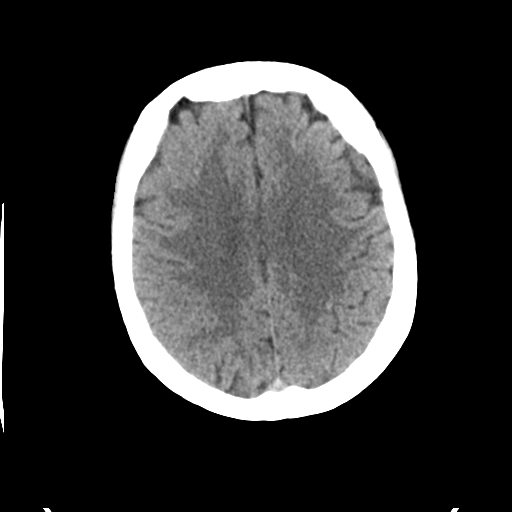
[im 27/35  brain]
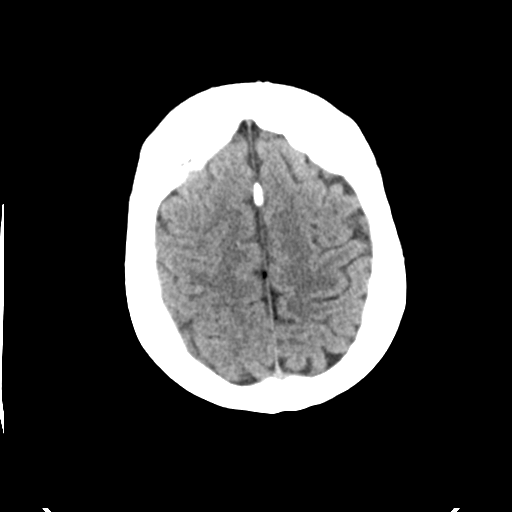
[im 32/35  brain]
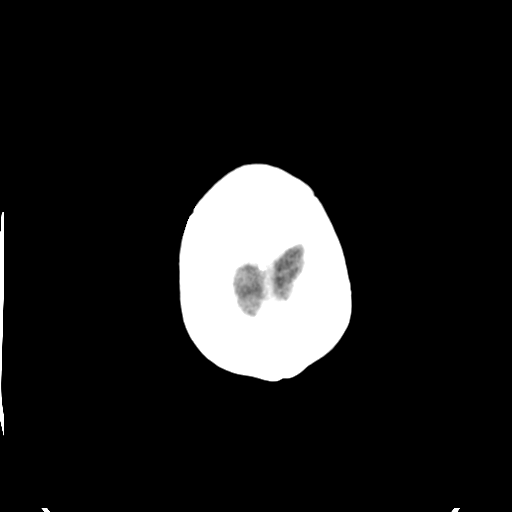

[Series 5: head 3.0 mpr cor · coronal · 0.34mm/px · 3 of 70 slices shown]
[im 24/70  brain]
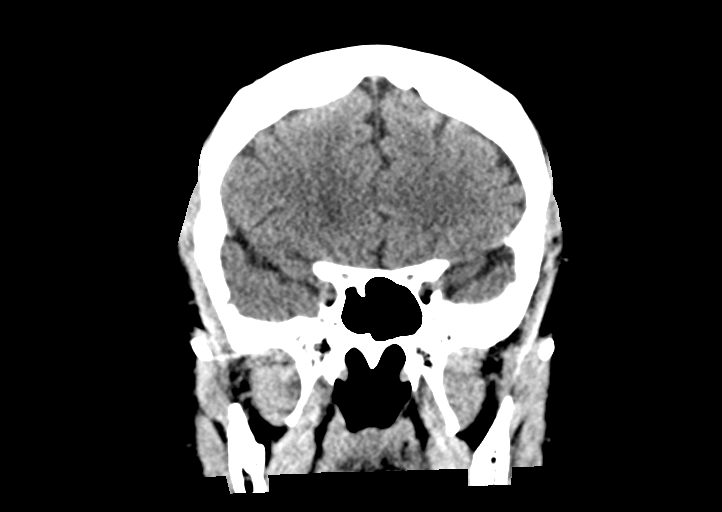
[im 31/70  brain]
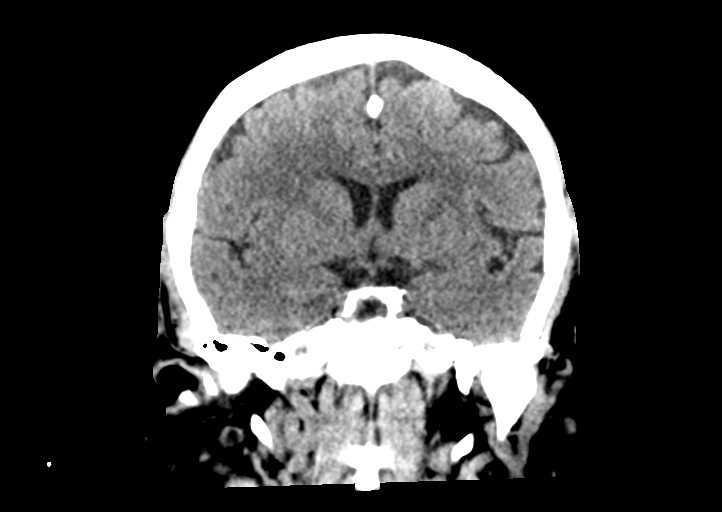
[im 39/70  brain]
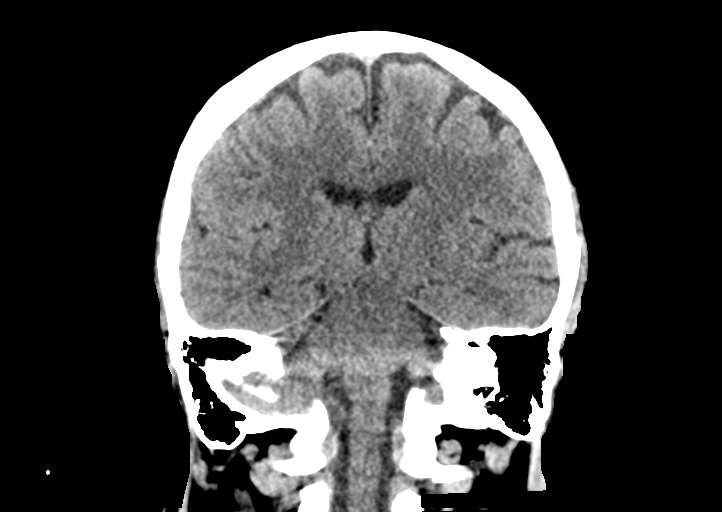

[Series 6: head 3.0 mpr sag · sagittal · 0.38mm/px · 3 of 67 slices shown]
[im 23/67  brain]
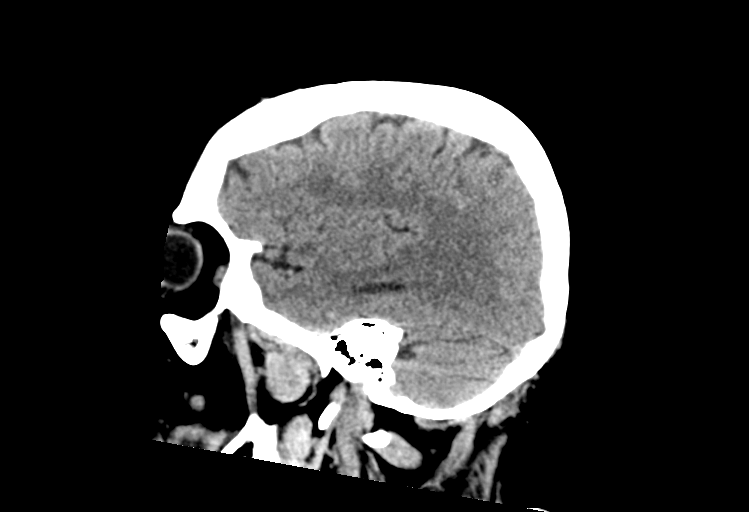
[im 34/67  brain]
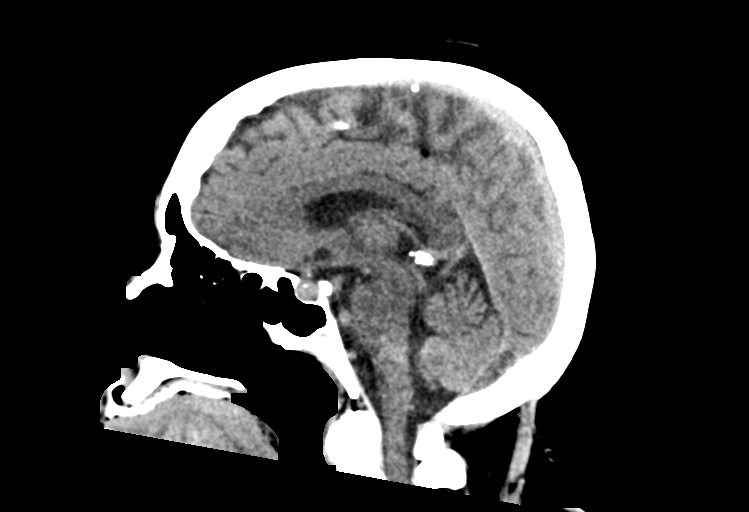
[im 45/67  brain]
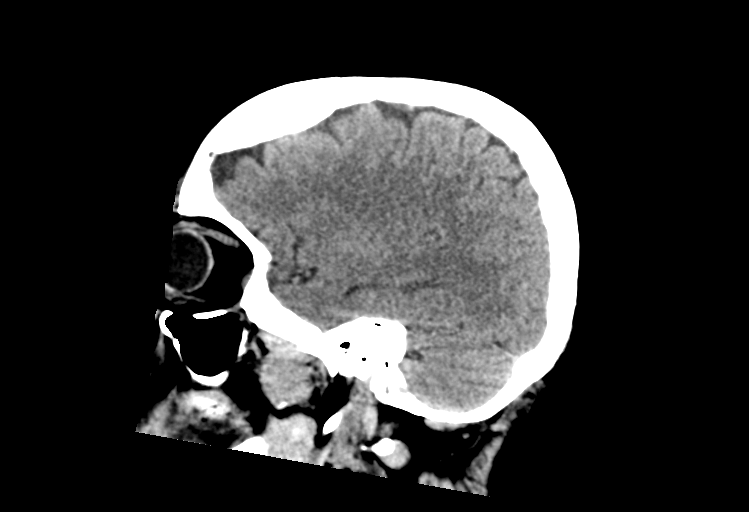

[14 of 47 positions shown; findings below may reference images not displayed]

FINDINGS: Brain: No evidence of acute infarction, hemorrhage, hydrocephalus,
extra-axial collection or mass lesion/mass effect.

Vascular: No hyperdense vessel or unexpected calcification.

Skull: Normal. Negative for fracture or focal lesion.

Sinuses/Orbits: No acute finding.

Other: None.
IMPRESSION: No acute intracranial pathology.

## 2022-03-11 ENCOUNTER — Encounter (HOSPITAL_COMMUNITY): Payer: Self-pay

## 2022-03-11 ENCOUNTER — Other Ambulatory Visit: Payer: Self-pay

## 2022-03-11 ENCOUNTER — Emergency Department (HOSPITAL_COMMUNITY): Payer: Medicare HMO

## 2022-03-11 ENCOUNTER — Ambulatory Visit (HOSPITAL_COMMUNITY)
Admission: EM | Admit: 2022-03-11 | Discharge: 2022-03-11 | Disposition: A | Discharge status: Home / Self Care | Payer: Medicare HMO | Attending: Internal Medicine | Admitting: Internal Medicine

## 2022-03-11 ENCOUNTER — Emergency Department (HOSPITAL_COMMUNITY)
Admission: EM | Admit: 2022-03-11 | Discharge: 2022-03-11 | Discharge status: Against Medical Advice | Payer: Medicare HMO | Attending: Student | Admitting: Student

## 2022-03-11 ENCOUNTER — Encounter (HOSPITAL_COMMUNITY): Payer: Self-pay | Admitting: Emergency Medicine

## 2022-03-11 DIAGNOSIS — N390 Urinary tract infection, site not specified: Secondary | ICD-10-CM | POA: Diagnosis not present

## 2022-03-11 DIAGNOSIS — Z5321 Procedure and treatment not carried out due to patient leaving prior to being seen by health care provider: Secondary | ICD-10-CM | POA: Insufficient documentation

## 2022-03-11 DIAGNOSIS — R1110 Vomiting, unspecified: Secondary | ICD-10-CM

## 2022-03-11 DIAGNOSIS — R109 Unspecified abdominal pain: Secondary | ICD-10-CM | POA: Diagnosis present

## 2022-03-11 DIAGNOSIS — R112 Nausea with vomiting, unspecified: Secondary | ICD-10-CM | POA: Diagnosis not present

## 2022-03-11 DIAGNOSIS — R1030 Lower abdominal pain, unspecified: Secondary | ICD-10-CM

## 2022-03-11 DIAGNOSIS — A059 Bacterial foodborne intoxication, unspecified: Secondary | ICD-10-CM | POA: Diagnosis not present

## 2022-03-11 LAB — COMPREHENSIVE METABOLIC PANEL
ALT: 11 U/L (ref 0–44)
AST: 16 U/L (ref 15–41)
Albumin: 4.1 g/dL (ref 3.5–5.0)
Alkaline Phosphatase: 68 U/L (ref 38–126)
Anion gap: 9 (ref 5–15)
BUN: 15 mg/dL (ref 8–23)
CO2: 21 mmol/L — ABNORMAL LOW (ref 22–32)
Calcium: 10.3 mg/dL (ref 8.9–10.3)
Chloride: 113 mmol/L — ABNORMAL HIGH (ref 98–111)
Creatinine, Ser: 0.95 mg/dL (ref 0.44–1.00)
GFR, Estimated: >60 mL/min (ref >60)
Glucose, Bld: 141 mg/dL — ABNORMAL HIGH (ref 70–99)
Potassium: 3.6 mmol/L (ref 3.5–5.1)
Sodium: 143 mmol/L (ref 135–145)
Total Bilirubin: 0.3 mg/dL (ref 0.3–1.2)
Total Protein: 7.5 g/dL (ref 6.5–8.1)

## 2022-03-11 LAB — CBC WITH DIFFERENTIAL/PLATELET
Abs Immature Granulocytes: 0.06 10*3/uL (ref 0.00–0.07)
Basophils Absolute: 0 10*3/uL (ref 0.0–0.1)
Basophils Relative: 0 %
Eosinophils Absolute: 0 10*3/uL (ref 0.0–0.5)
Eosinophils Relative: 0 %
HCT: 41.4 % (ref 36.0–46.0)
Hemoglobin: 13 g/dL (ref 12.0–15.0)
Immature Granulocytes: 0 %
Lymphocytes Relative: 6 %
Lymphs Abs: 0.8 10*3/uL (ref 0.7–4.0)
MCH: 27.3 pg (ref 26.0–34.0)
MCHC: 31.4 g/dL (ref 30.0–36.0)
MCV: 87 fL (ref 80.0–100.0)
Monocytes Absolute: 0.6 10*3/uL (ref 0.1–1.0)
Monocytes Relative: 4 %
Neutro Abs: 13 10*3/uL — ABNORMAL HIGH (ref 1.7–7.7)
Neutrophils Relative %: 90 %
Platelets: 293 10*3/uL (ref 150–400)
RBC: 4.76 MIL/uL (ref 3.87–5.11)
RDW: 15 % (ref 11.5–15.5)
WBC: 14.5 10*3/uL — ABNORMAL HIGH (ref 4.0–10.5)
nRBC: 0 % (ref 0.0–0.2)

## 2022-03-11 LAB — URINALYSIS, ROUTINE W REFLEX MICROSCOPIC
Bilirubin Urine: NEGATIVE
Glucose, UA: NEGATIVE mg/dL
Ketones, ur: NEGATIVE mg/dL
Nitrite: NEGATIVE
Protein, ur: NEGATIVE mg/dL
RBC / HPF: >50 RBC/hpf — ABNORMAL HIGH (ref 0–5)
Specific Gravity, Urine: 1.016 (ref 1.005–1.030)
pH: 9 — ABNORMAL HIGH (ref 5.0–8.0)

## 2022-03-11 LAB — LIPASE, BLOOD: Lipase: 31 U/L (ref 11–51)

## 2022-03-11 MED ORDER — NITROFURANTOIN MONOHYD MACRO 100 MG PO CAPS: 100.0000 mg | ORAL_CAPSULE | Freq: Two times a day (BID) | ORAL | 0 refills | Status: DC

## 2022-03-11 MED ORDER — SODIUM CHLORIDE 0.9 % IV SOLN
8.0000 mg | Freq: Once | INTRAVENOUS | Status: DC
  Filled 2022-03-11: qty 4

## 2022-03-11 MED ORDER — ONDANSETRON 4 MG PO TBDP
ORAL_TABLET | ORAL | Status: AC
  Filled 2022-03-11: qty 1

## 2022-03-11 MED ORDER — DICYCLOMINE HCL 20 MG PO TABS: 20.0000 mg | ORAL_TABLET | Freq: Two times a day (BID) | ORAL | 0 refills | Status: AC

## 2022-03-11 MED ORDER — ONDANSETRON 4 MG PO TBDP
8.0000 mg | ORAL_TABLET | Freq: Once | ORAL | Status: AC
  Administered 2022-03-11: 8 mg via ORAL

## 2022-03-11 MED ORDER — ONDANSETRON 4 MG PO TBDP
4.0000 mg | ORAL_TABLET | Freq: Once | ORAL | Status: AC
  Administered 2022-03-11: 4 mg via ORAL

## 2022-03-11 MED ORDER — ONDANSETRON 8 MG PO TBDP: 8.0000 mg | ORAL_TABLET | Freq: Three times a day (TID) | ORAL | 0 refills | Status: AC | PRN

## 2022-03-11 NOTE — ED Provider Notes (Signed)
[facility]    CSN: [id number] Arrival date & time: 03/11/22  1507      History   Chief Complaint Chief Complaint  Patient presents with   Abdominal Pain    HPI Michele Gross is a 66 y.o. female.   HPI  She is complaining of abdominal pain and dry heaves. She was seen briefly on last evening at the ED however left before being seen. She reports eating canned salmon and then stared feeling bad. She has not been treated for the N/V. She did have labs and UA on last evening. She has elevated WBC with shift and Bacteria in her urine. She just wants to stop hurting. Her abdominal xray was negative.   Past Medical History:  Diagnosis Date   Chest pain    Negative nuclear stress 2007, negative stress echo 2011, reported negative cath 2000; negative stress echo November 2013   Chronic shoulder pain    DJD (degenerative joint disease)    GERD (gastroesophageal reflux disease)    Hyperlipidemia    Hypertension    Obesity    Sleep apnea     Patient Active Problem List   Diagnosis Date Noted   Cervical radiculopathy due to degenerative joint disease of spine 05/02/2012   Chest pain, atypical 05/02/2012   HYPERLIPIDEMIA 07/01/2007   GERD 07/01/2007   BACK PAIN 07/01/2007    Past Surgical History:  Procedure Laterality Date   PARTIAL HYSTERECTOMY      OB History   No obstetric history on file.      Home Medications    Prior to Admission medications   Medication Sig Start Date End Date Taking? Authorizing Provider  dicyclomine (BENTYL) 20 MG tablet Take 1 tablet (20 mg total) by mouth 2 (two) times daily. 03/11/22  Yes Arrion Broaddus, Diona Foley, NP  nitrofurantoin, macrocrystal-monohydrate, (MACROBID) 100 MG capsule Take 1 capsule (100 mg total) by mouth 2 (two) times daily. 03/11/22  Yes Vevelyn Francois, NP  ondansetron (ZOFRAN-ODT) 8 MG disintegrating tablet Take 1 tablet (8 mg total) by mouth every 8 (eight) hours as needed for up to 15 days for nausea or  vomiting. 03/11/22 03/26/22 Yes Vevelyn Francois, NP  aspirin EC 81 MG EC tablet Take 1 tablet (81 mg total) by mouth daily. 05/03/12   Dorian Heckle, MD  atorvastatin (LIPITOR) 10 MG tablet Take 10 mg by mouth daily. 08/26/21   [provider]  HYDROcodone-acetaminophen (NORCO/VICODIN) 5-325 MG tablet Take 1 tablet by mouth every 6 (six) hours as needed for moderate pain.    [provider]  metoprolol tartrate (LOPRESSOR) 25 MG tablet Take 0.5 tablets (12.5 mg total) by mouth 2 (two) times daily. TAKE 1/2 TABLET(12.5 MG) BY MOUTH TWICE DAILY 01/11/20 11/21/21  Augusto Gamble B, NP  OZEMPIC, 1 MG/DOSE, 4 MG/3ML SOPN Inject into the skin. 10/28/21   [provider]    Family History Family History  Problem Relation Age of Onset   Cancer Father        colon and later "throat;" in remission   Colon polyps Neg Hx    Colon cancer Neg Hx    Esophageal cancer Neg Hx    Stomach cancer Neg Hx    Rectal cancer Neg Hx     Social History Social History   Tobacco Use   Smoking status: Never   Smokeless tobacco: Never  Vaping Use   Vaping Use: Never used  Substance Use Topics   Alcohol use: Not Currently  Alcohol/week: 2.0 standard drinks of alcohol    Types: 2 Glasses of wine per week   Drug use: Yes    Types: Marijuana    Comment: occasionally     Allergies   Percocet [oxycodone-acetaminophen]   Review of Systems Review of Systems   Physical Exam Triage Vital Signs ED Triage Vitals  Enc Vitals Group     BP 03/11/22 1555 (!) 142/77     Pulse Rate 03/11/22 1555 82     Resp 03/11/22 1555 18     Temp 03/11/22 1555 98.7 F (37.1 C)     Temp Source 03/11/22 1555 Oral     SpO2 03/11/22 1555 96 %     Weight --      Height --      Head Circumference --      Peak Flow --      Pain Score 03/11/22 1551 10     Pain Loc --      Pain Edu? --      Excl. in [place]? --    No data found.  Updated Vital Signs BP (!) 142/77 (BP Location: Left Arm)   Pulse 82    Temp 98.7 F (37.1 C) (Oral)   Resp 18   SpO2 96%   Visual Acuity Right Eye Distance:   Left Eye Distance:   Bilateral Distance:    Right Eye Near:   Left Eye Near:    Bilateral Near:     Physical Exam Constitutional:      General: She is not in acute distress (mild due to dry heaves).    Appearance: She is obese.  HENT:     Head: Normocephalic.  Cardiovascular:     Rate and Rhythm: Normal rate.  Pulmonary:     Effort: Pulmonary effort is normal.  Skin:    General: Skin is warm.  Neurological:     General: No focal deficit present.     Mental Status: She is alert.  Psychiatric:        Mood and Affect: Mood normal.      UC Treatments / Results  Labs (all labs ordered are listed, but only abnormal results are displayed) Labs Reviewed - No data to display  EKG   Radiology DG Abdomen Acute W/Chest  Result Date: 03/11/2022 CLINICAL DATA:  Encounter for bowel obstruction EXAM: DG ABDOMEN ACUTE WITH 1 VIEW CHEST COMPARISON:  01/07/2019 FINDINGS: Normal heart size and stable aortic tortuosity. There is no edema, consolidation, effusion, or pneumothorax. Gas and stool seen along the colon. No evidence of obstruction. Small calcification over the right flank is indeterminate, no urinary calculi seen on abdominal CT from earlier this year. No concerning intra-abdominal mass effect or gas collection. 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 Diffuse pelvic enthesophytes. IMPRESSION: Negative abdominal radiographs.  No acute cardiopulmonary disease. Electronically Signed   By: Jorje Guild M.D.   On: 03/11/2022 04:09    Procedures Procedures (including critical care time)  Medications Ordered in UC Medications  ondansetron (ZOFRAN-ODT) disintegrating tablet 4 mg (4 mg Oral Given 03/11/22 1557)    Initial Impression / Assessment and Plan / UC Course  I have reviewed the triage vital signs and the  nursing notes.  Pertinent labs & imaging results that were available during my care of the patient were reviewed by me and considered in my medical decision making (see chart for details).  Abdominal pain Final Clinical Impressions(s) / UC Diagnoses   Final diagnoses:  Lower abdominal pain  Lower urinary tract infectious disease  Food poisoning  Dry heaves     Discharge Instructions      You may have food poisoning. To be treated with Zofran ODT 8 mg daily for nausea. You have been prescribed Bentyl 20 mg twice a day for abdominal spasms/cramps. I recommend Banana Rice Applesauce and Toast along with ice chips.  You also have a Urinary tract infection and this will be treated with Macrobid 100 mg twice a day for 5 days. Hydration is key but recommend slow and steady             ED Prescriptions     Medication Sig Dispense Auth. Provider   ondansetron (ZOFRAN-ODT) 8 MG disintegrating tablet Take 1 tablet (8 mg total) by mouth every 8 (eight) hours as needed for up to 15 days for nausea or vomiting. 30 tablet Dionisio David M, NP   nitrofurantoin, macrocrystal-monohydrate, (MACROBID) 100 MG capsule Take 1 capsule (100 mg total) by mouth 2 (two) times daily. 10 capsule Vevelyn Francois, NP   dicyclomine (BENTYL) 20 MG tablet Take 1 tablet (20 mg total) by mouth 2 (two) times daily. 20 tablet Vevelyn Francois, NP      PDMP not reviewed this encounter.   Dionisio David [place], [place] 03/11/22 [phone]

## 2022-03-11 NOTE — ED Notes (Signed)
Patient actively throwing up

## 2022-03-11 NOTE — ED Provider Triage Note (Signed)
Emergency Medicine Provider Triage Evaluation Note  Michele Gross , a 66 y.o. female  was evaluated in triage.  Pt complains of abdominal pain, started yesterday at 6 PM, after she ate a can of salmon, pain is all over, has associated nausea and vomiting, no no hematemesis coffee-ground emesis no urinary symptoms, states that she has not had a bowel movement today, no stomach surgeries.  No urinary symptoms  Review of Systems  Positive: Some pain, vomiting Negative: Constipation, diarrhea  Physical Exam  BP (!) 167/99 (BP Location: Right Arm)   Pulse 89   Temp 98.3 F (36.8 C) (Oral)   Resp 18   Ht 5\' 2"  (1.575 m)   Wt 90.7 kg   SpO2 99%   BMI 36.58 kg/m  Gen:   Awake, no distress   Resp:  Normal effort  MSK:   Moves extremities without difficulty  Other:    Medical Decision Making  Medically screening exam initiated at 3:19 AM.  Appropriate orders placed.  Michele Gross was informed that the remainder of the evaluation will be completed by another provider, this initial triage assessment does not replace that evaluation, and the importance of remaining in the ED until their evaluation is complete.  Lab work imaging has been ordered.   Marcello Fennel, PA-C 03/11/22 0320

## 2022-03-11 NOTE — Discharge Instructions (Addendum)
You may have food poisoning. To be treated with Zofran ODT 8 mg daily for nausea. You have been prescribed Bentyl 20 mg twice a day for abdominal spasms/cramps. I recommend Banana Rice Applesauce and Toast along with ice chips.  You also have a Urinary tract infection and this will be treated with Macrobid 100 mg twice a day for 5 days. Hydration is key but recommend slow and steady

## 2022-03-11 NOTE — ED Triage Notes (Signed)
Per EMS, pt c/o generalized abdominal pain and n/v that started after she ate canned salmon.   132/70 P 67 97% RA 149 CBG

## 2022-03-11 NOTE — ED Notes (Signed)
Patient states the wait is long and she is leaving 

## 2022-03-11 NOTE — ED Triage Notes (Signed)
Pt has stomach pain all over. She went to the ED last night and did not get seen. PT ate canned salmon and has been feeling ill and vomiting since. Pt has not taken anything.

## 2022-07-11 ENCOUNTER — Ambulatory Visit (HOSPITAL_COMMUNITY)
Admission: EM | Admit: 2022-07-11 | Discharge: 2022-07-11 | Disposition: A | Discharge status: Home / Self Care | Payer: Medicare HMO | Attending: Family Medicine | Admitting: Family Medicine

## 2022-07-11 ENCOUNTER — Encounter (HOSPITAL_COMMUNITY): Payer: Self-pay | Admitting: *Deleted

## 2022-07-11 ENCOUNTER — Ambulatory Visit (INDEPENDENT_AMBULATORY_CARE_PROVIDER_SITE_OTHER): Payer: Medicare HMO

## 2022-07-11 DIAGNOSIS — Z1152 Encounter for screening for COVID-19: Secondary | ICD-10-CM | POA: Diagnosis not present

## 2022-07-11 DIAGNOSIS — R059 Cough, unspecified: Secondary | ICD-10-CM | POA: Diagnosis present

## 2022-07-11 DIAGNOSIS — R0989 Other specified symptoms and signs involving the circulatory and respiratory systems: Secondary | ICD-10-CM | POA: Diagnosis present

## 2022-07-11 DIAGNOSIS — R079 Chest pain, unspecified: Secondary | ICD-10-CM

## 2022-07-11 DIAGNOSIS — J4521 Mild intermittent asthma with (acute) exacerbation: Secondary | ICD-10-CM | POA: Insufficient documentation

## 2022-07-11 DIAGNOSIS — J069 Acute upper respiratory infection, unspecified: Secondary | ICD-10-CM | POA: Diagnosis not present

## 2022-07-11 MED ORDER — ALBUTEROL SULFATE HFA 108 (90 BASE) MCG/ACT IN AERS: 2.0000 | INHALATION_SPRAY | RESPIRATORY_TRACT | 0 refills | Status: AC | PRN

## 2022-07-11 MED ORDER — PREDNISONE 20 MG PO TABS: 40.0000 mg | ORAL_TABLET | Freq: Every day | ORAL | 0 refills | Status: AC

## 2022-07-11 MED ORDER — BENZONATATE 100 MG PO CAPS: 100.0000 mg | ORAL_CAPSULE | Freq: Three times a day (TID) | ORAL | 0 refills | Status: DC | PRN

## 2022-07-11 NOTE — ED Provider Notes (Signed)
[facility]    CSN: [id number] Arrival date & time: 07/11/22  4166      History   Chief Complaint Chief Complaint  Patient presents with   Cough   Chest Pain   Nasal Congestion   Headache   Fatigue    HPI Michele Gross is a 67 y.o. female.    Cough Associated symptoms: chest pain and headaches   Chest Pain Associated symptoms: cough and headache   Headache Associated symptoms: cough    Here for cough and chest congestion that began on January 15.  She is also had a little rhinorrhea and nasal congestion.  Her chest has been sore also in the anterior aspect.  She has felt tired but has not had any fever or chills.  Also no nausea vomiting or diarrhea.  She has had some frontal headache.  She has had to use an inhaler in the past.  Past Medical History:  Diagnosis Date   Chest pain    Negative nuclear stress 2007, negative stress echo 2011, reported negative cath 2000; negative stress echo November 2013   Chronic shoulder pain    DJD (degenerative joint disease)    GERD (gastroesophageal reflux disease)    Hyperlipidemia    Hypertension    Obesity    Sleep apnea     Patient Active Problem List   Diagnosis Date Noted   Cervical radiculopathy due to degenerative joint disease of spine 05/02/2012   Chest pain, atypical 05/02/2012   HYPERLIPIDEMIA 07/01/2007   GERD 07/01/2007   BACK PAIN 07/01/2007    Past Surgical History:  Procedure Laterality Date   PARTIAL HYSTERECTOMY      OB History   No obstetric history on file.      Home Medications    Prior to Admission medications   Medication Sig Start Date End Date Taking? Authorizing Provider  albuterol (VENTOLIN HFA) 108 (90 Base) MCG/ACT inhaler Inhale 2 puffs into the lungs every 4 (four) hours as needed for wheezing or shortness of breath. 07/11/22  Yes Zenia Resides, MD  allopurinol (ZYLOPRIM) 100 MG tablet Take 100 mg by mouth daily. 04/22/22  Yes [provider]   aspirin EC 81 MG EC tablet Take 1 tablet (81 mg total) by mouth daily. 05/03/12  Yes Kristie Cowman, MD  atorvastatin (LIPITOR) 10 MG tablet Take 10 mg by mouth daily. 08/26/21  Yes [provider]  benzonatate (TESSALON) 100 MG capsule Take 1 capsule (100 mg total) by mouth 3 (three) times daily as needed for cough. 07/11/22  Yes Keyvon Herter, Janace Aris, MD  dicyclomine (BENTYL) 20 MG tablet Take 1 tablet (20 mg total) by mouth 2 (two) times daily. 03/11/22  Yes Barbette Merino, NP  HYDROcodone-acetaminophen (NORCO/VICODIN) 5-325 MG tablet Take 1 tablet by mouth every 6 (six) hours as needed for moderate pain.   Yes [provider]  metoprolol tartrate (LOPRESSOR) 25 MG tablet Take 0.5 tablets (12.5 mg total) by mouth 2 (two) times daily. TAKE 1/2 TABLET(12.5 MG) BY MOUTH TWICE DAILY 01/11/20 07/11/22 Yes Burky, Natalie B, NP  OZEMPIC, 1 MG/DOSE, 4 MG/3ML SOPN Inject into the skin. 10/28/21  Yes [provider]  predniSONE (DELTASONE) 20 MG tablet Take 2 tablets (40 mg total) by mouth daily with breakfast for 5 days. 07/11/22 07/16/22 Yes Zenia Resides, MD  topiramate (TOPAMAX) 100 MG tablet Take by mouth. 07/03/22  Yes [provider]    Family History Family History  Problem Relation Age  of Onset   Cancer Father        colon and later "throat;" in remission   Colon polyps Neg Hx    Colon cancer Neg Hx    Esophageal cancer Neg Hx    Stomach cancer Neg Hx    Rectal cancer Neg Hx     Social History Social History   Tobacco Use   Smoking status: Never   Smokeless tobacco: Never  Vaping Use   Vaping Use: Never used  Substance Use Topics   Alcohol use: Not Currently    Alcohol/week: 2.0 standard drinks of alcohol    Types: 2 Glasses of wine per week   Drug use: Yes    Types: Marijuana    Comment: occasionally     Allergies   Percocet [oxycodone-acetaminophen]   Review of Systems Review of Systems  Respiratory:  Positive for cough.    Cardiovascular:  Positive for chest pain.  Neurological:  Positive for headaches.     Physical Exam Triage Vital Signs ED Triage Vitals  Enc Vitals Group     BP 07/11/22 0911 107/74     Pulse Rate 07/11/22 0911 69     Resp 07/11/22 0911 18     Temp 07/11/22 0911 98.1 F (36.7 C)     Temp Source 07/11/22 0911 Oral     SpO2 07/11/22 0911 96 %     Weight --      Height --      Head Circumference --      Peak Flow --      Pain Score 07/11/22 0909 7     Pain Loc --      Pain Edu? --      Excl. in [place]? --    No data found.  Updated Vital Signs BP 107/74 (BP Location: Right Arm)   Pulse 69   Temp 98.1 F (36.7 C) (Oral)   Resp 18   SpO2 96%   Visual Acuity Right Eye Distance:   Left Eye Distance:   Bilateral Distance:    Right Eye Near:   Left Eye Near:    Bilateral Near:     Physical Exam Vitals reviewed.  Constitutional:      General: She is not in acute distress.    Appearance: She is not toxic-appearing.  HENT:     Right Ear: Tympanic membrane and ear canal normal.     Left Ear: Tympanic membrane and ear canal normal.     Nose: Nose normal.     Mouth/Throat:     Mouth: Mucous membranes are moist.     Pharynx: No oropharyngeal exudate or posterior oropharyngeal erythema.  Eyes:     Extraocular Movements: Extraocular movements intact.     Conjunctiva/sclera: Conjunctivae normal.     Pupils: Pupils are equal, round, and reactive to light.  Cardiovascular:     Rate and Rhythm: Normal rate and regular rhythm.     Heart sounds: No murmur heard. Pulmonary:     Effort: No respiratory distress.     Breath sounds: No stridor. No wheezing, rhonchi or rales.  Chest:     Chest wall: No tenderness.  Musculoskeletal:     Cervical back: Neck supple.  Lymphadenopathy:     Cervical: No cervical adenopathy.  Skin:    Capillary Refill: Capillary refill takes less than 2 seconds.     Coloration: Skin is not jaundiced or pale.  Neurological:     General: No focal  deficit present.  Mental Status: She is alert and oriented to person, place, and time.  Psychiatric:        Behavior: Behavior normal.      UC Treatments / Results  Labs (all labs ordered are listed, but only abnormal results are displayed) Labs Reviewed  SARS CORONAVIRUS 2 (TAT 6-24 HRS)    EKG   Radiology DG Chest 2 View  Result Date: 07/11/2022 CLINICAL DATA:  Chest congestion and anterior chest pain/soreness. EXAM: CHEST - 2 VIEW COMPARISON:  03/11/2022 acute abdomen series. FINDINGS: Midline trachea. Normal heart size. Tortuous thoracic aorta. Azygous fissure. No pleural effusion or pneumothorax. Clear lungs. IMPRESSION: No active cardiopulmonary disease. Electronically Signed   By: Abigail Miyamoto M.D.   On: 07/11/2022 09:53    Procedures Procedures (including critical care time)  Medications Ordered in UC Medications - No data to display  Initial Impression / Assessment and Plan / UC Course  I have reviewed the triage vital signs and the nursing notes.  Pertinent labs & imaging results that were available during my care of the patient were reviewed by me and considered in my medical decision making (see chart for details).        Chest x-ray is clear.  I am going to treat for asthma exacerbation, and she is swabbed for COVID.  If positive for COVID, she is a candidate for Paxlovid.  Her last EGFR was greater than 60 in the fall 2023 Final Clinical Impressions(s) / UC Diagnoses   Final diagnoses:  Viral URI with cough  Mild intermittent asthma with acute exacerbation     Discharge Instructions      Your chest x-ray was clear and did not show any pneumonia or fluid  Albuterol inhaler--do 2 puffs every 4 hours as needed for shortness of breath or wheezing  Take prednisone 20 mg--2 daily for 5 days  Take benzonatate 100 mg, 1 tab every 8 hours as needed for cough.   You have been swabbed for COVID, and the test will result in the next 24 hours. Our staff  will call you if positive. If the COVID test is positive, you should quarantine for 5 days from the start of your symptoms.  On days 6-10 from the start of your illness, you should wear a mask if out in public.      ED Prescriptions     Medication Sig Dispense Auth. Provider   albuterol (VENTOLIN HFA) 108 (90 Base) MCG/ACT inhaler Inhale 2 puffs into the lungs every 4 (four) hours as needed for wheezing or shortness of breath. 1 each Barrett Henle, MD   predniSONE (DELTASONE) 20 MG tablet Take 2 tablets (40 mg total) by mouth daily with breakfast for 5 days. 10 tablet Barrett Henle, MD   benzonatate (TESSALON) 100 MG capsule Take 1 capsule (100 mg total) by mouth 3 (three) times daily as needed for cough. 21 capsule Barrett Henle, MD      I have reviewed the PDMP during this encounter.   Barrett Henle, MD 07/11/22 1022

## 2022-07-11 NOTE — ED Triage Notes (Signed)
Pt states she has been coughing a lot that started Monday and it now making her chest sore. She complains of headaches and fatigue. She hasn't been taking any meds for her sx

## 2022-07-11 NOTE — Discharge Instructions (Signed)
Your chest x-ray was clear and did not show any pneumonia or fluid  Albuterol inhaler--do 2 puffs every 4 hours as needed for shortness of breath or wheezing  Take prednisone 20 mg--2 daily for 5 days  Take benzonatate 100 mg, 1 tab every 8 hours as needed for cough.   You have been swabbed for COVID, and the test will result in the next 24 hours. Our staff will call you if positive. If the COVID test is positive, you should quarantine for 5 days from the start of your symptoms.  On days 6-10 from the start of your illness, you should wear a mask if out in public.

## 2022-07-12 LAB — SARS CORONAVIRUS 2 (TAT 6-24 HRS): SARS Coronavirus 2: NEGATIVE

## 2023-04-03 ENCOUNTER — Other Ambulatory Visit: Payer: Self-pay | Admitting: Student

## 2023-04-03 DIAGNOSIS — B182 Chronic viral hepatitis C: Secondary | ICD-10-CM

## 2023-04-21 ENCOUNTER — Ambulatory Visit
Admission: RE | Admit: 2023-04-21 | Discharge: 2023-04-21 | Disposition: A | Discharge status: Home / Self Care | Payer: Medicare PPO | Source: Ambulatory Visit | Attending: Student | Admitting: Student

## 2023-04-21 DIAGNOSIS — B182 Chronic viral hepatitis C: Secondary | ICD-10-CM

## 2023-05-31 ENCOUNTER — Emergency Department (HOSPITAL_COMMUNITY)
Admission: EM | Admit: 2023-05-31 | Discharge: 2023-05-31 | Disposition: A | Discharge status: Home / Self Care | Payer: Medicare PPO | Attending: Emergency Medicine | Admitting: Emergency Medicine

## 2023-05-31 ENCOUNTER — Encounter (HOSPITAL_COMMUNITY): Payer: Self-pay

## 2023-05-31 ENCOUNTER — Other Ambulatory Visit: Payer: Self-pay

## 2023-05-31 ENCOUNTER — Emergency Department (HOSPITAL_COMMUNITY): Payer: Medicare PPO

## 2023-05-31 DIAGNOSIS — M79602 Pain in left arm: Secondary | ICD-10-CM | POA: Insufficient documentation

## 2023-05-31 DIAGNOSIS — I10 Essential (primary) hypertension: Secondary | ICD-10-CM | POA: Diagnosis not present

## 2023-05-31 DIAGNOSIS — Z7982 Long term (current) use of aspirin: Secondary | ICD-10-CM | POA: Diagnosis not present

## 2023-05-31 DIAGNOSIS — R0789 Other chest pain: Secondary | ICD-10-CM | POA: Insufficient documentation

## 2023-05-31 DIAGNOSIS — Z79899 Other long term (current) drug therapy: Secondary | ICD-10-CM | POA: Insufficient documentation

## 2023-05-31 DIAGNOSIS — R079 Chest pain, unspecified: Secondary | ICD-10-CM | POA: Diagnosis present

## 2023-05-31 LAB — COMPREHENSIVE METABOLIC PANEL
ALT: 17 U/L (ref 0–44)
AST: 16 U/L (ref 15–41)
Albumin: 3.6 g/dL (ref 3.5–5.0)
Alkaline Phosphatase: 60 U/L (ref 38–126)
Anion gap: 7 (ref 5–15)
BUN: 11 mg/dL (ref 8–23)
CO2: 25 mmol/L (ref 22–32)
Calcium: 9.4 mg/dL (ref 8.9–10.3)
Chloride: 107 mmol/L (ref 98–111)
Creatinine, Ser: 0.68 mg/dL (ref 0.44–1.00)
GFR, Estimated: >60 mL/min (ref >60)
Glucose, Bld: 95 mg/dL (ref 70–99)
Potassium: 3.9 mmol/L (ref 3.5–5.1)
Sodium: 139 mmol/L (ref 135–145)
Total Bilirubin: 0.5 mg/dL (ref <1.2)
Total Protein: 6.8 g/dL (ref 6.5–8.1)

## 2023-05-31 LAB — CBC
HCT: 40.5 % (ref 36.0–46.0)
Hemoglobin: 12.8 g/dL (ref 12.0–15.0)
MCH: 28.7 pg (ref 26.0–34.0)
MCHC: 31.6 g/dL (ref 30.0–36.0)
MCV: 90.8 fL (ref 80.0–100.0)
Platelets: 235 10*3/uL (ref 150–400)
RBC: 4.46 MIL/uL (ref 3.87–5.11)
RDW: 15.2 % (ref 11.5–15.5)
WBC: 9.2 10*3/uL (ref 4.0–10.5)
nRBC: 0 % (ref 0.0–0.2)

## 2023-05-31 LAB — TROPONIN I (HIGH SENSITIVITY)
Troponin I (High Sensitivity): 3 ng/L (ref <18)
Troponin I (High Sensitivity): 3 ng/L (ref <18)

## 2023-05-31 MED ORDER — IOHEXOL 350 MG/ML SOLN
100.0000 mL | Freq: Once | INTRAVENOUS | Status: AC | PRN
  Administered 2023-05-31: 100 mL via INTRAVENOUS

## 2023-05-31 MED ORDER — HYDROCODONE-ACETAMINOPHEN 5-325 MG PO TABS
2.0000 | ORAL_TABLET | Freq: Once | ORAL | Status: AC
  Administered 2023-05-31: 2 via ORAL
  Filled 2023-05-31: qty 2

## 2023-05-31 MED ORDER — IBUPROFEN 400 MG PO TABS
400.0000 mg | ORAL_TABLET | Freq: Once | ORAL | Status: AC
  Administered 2023-05-31: 400 mg via ORAL
  Filled 2023-05-31: qty 1

## 2023-05-31 NOTE — ED Notes (Signed)
Patient transported to CT. CCMD made aware.

## 2023-05-31 NOTE — ED Provider Notes (Signed)
Supervised resident visit.  Patient here with left arm pain chest pain.  Seems musculoskeletal on exam.  Reproducible and worse with movement.  Differential did include ACS dissection pneumonia.  We got lab work and images that were unremarkable.  No dissection or or ruptured aneurysm however she does have slight enlargement of her ascending thoracic aorta.  Radiology recommends yearly scans and she was made aware of this.  I have no concern for infectious process despite CT scan.  She has no cough or sputum production.  No leukocytosis.  Troponin negative x 2.  No PE found on CT scan as well.  No clinical concern for this.  Wells Kateri 0.  EKG shows sinus rhythm.  No ischemic changes.  Overall cardiac workup is unremarkable as well as lab work.  I do think that this is muscular.  Will give her a sling for comfort.  Will have her follow-up with cardiology and primary care to further rule out issues but at this time I think she can safely follow-up outpatient.  She understands return precautions.  This chart was dictated using voice recognition software.  Despite best efforts to proofread,  errors can occur which can change the documentation meaning.    Virgina Norfolk, DO 05/31/23 1745

## 2023-05-31 NOTE — ED Notes (Signed)
This RN reviewed discharge instructions with patient. She verbalized understanding and denied any further questions. PT well appearing upon discharge and left will sling on left arm. Pt ambulated with stable gait to exit. Pt endorses ride home.

## 2023-05-31 NOTE — ED Provider Notes (Signed)
[facility]MENT AT [facility] Provider Note   CSN: [id number] Arrival date & time: 05/31/23  1140     History  Chief Complaint  Patient presents with   Chest Pain    Michele Gross is a 67 y.o. female.  67 year old female with past medical history of HTN, HLD, hepatitis C presents here for concerns of chest pain that began 2 days ago.  Onset was gradual.  Pain is a heaviness, throbbing sensation in the left chest that radiates all the way down the left arm.  Pain worsens with exertion.  Patient endorses an associated heaviness sensation in her left arm.  Patient also reporting back pain in the left mid back, between her shoulder blades that began around the same time.  She denies any nausea, vomiting, shortness of breath, diaphoresis, abdominal pain.  Does feel that her left upper extremity is weak.  However, she attributes this to the pain and heaviness sensation that she is experiencing.  No trauma to the left upper extremity.  No other weakness.  Last stress test was in 2013 and was normal.  Does not follow with a cardiologist.  The history is provided by the patient and medical records.       Home Medications Prior to Admission medications   Medication Sig Start Date End Date Taking? Authorizing Provider  albuterol (VENTOLIN HFA) 108 (90 Base) MCG/ACT inhaler Inhale 2 puffs into the lungs every 4 (four) hours as needed for wheezing or shortness of breath. 07/11/22   Zenia Resides, MD  allopurinol (ZYLOPRIM) 100 MG tablet Take 100 mg by mouth daily. 04/22/22   [provider]  aspirin EC 81 MG EC tablet Take 1 tablet (81 mg total) by mouth daily. 05/03/12   Kristie Cowman, MD  atorvastatin (LIPITOR) 10 MG tablet Take 10 mg by mouth daily. 08/26/21   [provider]  benzonatate (TESSALON) 100 MG capsule Take 1 capsule (100 mg total) by mouth 3 (three) times daily as needed for cough. 07/11/22   Zenia Resides, MD  dicyclomine  (BENTYL) 20 MG tablet Take 1 tablet (20 mg total) by mouth 2 (two) times daily. 03/11/22   Barbette Merino, NP  HYDROcodone-acetaminophen (NORCO/VICODIN) 5-325 MG tablet Take 1 tablet by mouth every 6 (six) hours as needed for moderate pain.    [provider]  metoprolol tartrate (LOPRESSOR) 25 MG tablet Take 0.5 tablets (12.5 mg total) by mouth 2 (two) times daily. TAKE 1/2 TABLET(12.5 MG) BY MOUTH TWICE DAILY 01/11/20 07/11/22  Linus Mako B, NP  OZEMPIC, 1 MG/DOSE, 4 MG/3ML SOPN Inject into the skin. 10/28/21   [provider]  topiramate (TOPAMAX) 100 MG tablet Take by mouth. 07/03/22   [provider]      Allergies    Percocet [oxycodone-acetaminophen]    Review of Systems   As noted in HPI  Physical Exam Updated Vital Signs BP (!) 132/115 (BP Location: Right Arm)   Pulse 68   Temp 98.4 F (36.9 C) (Oral)   Resp 17   Ht 5' 3.5" (1.613 m)   Wt 79.4 kg   SpO2 100%   BMI 30.51 kg/m  Physical Exam Vitals reviewed.  Constitutional:      General: She is not in acute distress.    Appearance: She is not toxic-appearing or diaphoretic.     Comments: Uncomfortable appearing.  Clutching her left upper extremity with her right hand.  HENT:     Head: Normocephalic.  Nose: Nose normal.     Mouth/Throat:     Mouth: Mucous membranes are moist.  Eyes:     Conjunctiva/sclera: Conjunctivae normal.  Cardiovascular:     Rate and Rhythm: Normal rate and regular rhythm.     Pulses:          Radial pulses are 2+ on the right side and 2+ on the left side.       Dorsalis pedis pulses are 0 on the right side and 2+ on the left side.     Heart sounds: Normal heart sounds. No murmur heard.    No friction rub. No gallop.  Pulmonary:     Effort: Pulmonary effort is normal. No respiratory distress.     Breath sounds: Normal breath sounds. No wheezing, rhonchi or rales.  Chest:     Chest wall: Tenderness present.     Comments: Left, anterior chest wall tender to  palpation Abdominal:     General: There is no distension.     Palpations: Abdomen is soft.     Tenderness: There is no abdominal tenderness. There is no guarding or rebound.  Musculoskeletal:     Comments: Left upper extremity diffusely tender to palpation.  No associated erythema or swelling.  Pain elicited with active and passive range of motion of the shoulder joint.  Left, thoracic back tender to palpation.  Skin:    General: Skin is warm and dry.  Neurological:     Mental Status: She is alert.     ED Results / Procedures / Treatments   Labs (all labs ordered are listed, but only abnormal results are displayed) Labs Reviewed  CBC  COMPREHENSIVE METABOLIC PANEL  TROPONIN I (HIGH SENSITIVITY)  TROPONIN I (HIGH SENSITIVITY)    EKG EKG Interpretation Date/Time:  Sunday May 31 2023 11:55:14 EST Ventricular Rate:  57 PR Interval:  162 QRS Duration:  90 QT Interval:  430 QTC Calculation: 418 R Axis:   47  Text Interpretation: Sinus bradycardia When compared with ECG of 10-Dec-2019 17:22, PREVIOUS ECG IS PRESENT Confirmed by Virgina Norfolk [phone]) on 05/31/2023 3:05:02 PM  Radiology CT Angio Chest/Abd/Pel for Dissection W and/or Wo Contrast  Result Date: 05/31/2023 CLINICAL DATA:  Acute aortic syndrome, chest and left arm pain for 2 days EXAM: CT ANGIOGRAPHY CHEST, ABDOMEN AND PELVIS TECHNIQUE: Non-contrast CT of the chest was initially obtained. Multidetector CT imaging through the chest, abdomen and pelvis was performed using the standard protocol during bolus administration of intravenous contrast. Multiplanar reconstructed images and MIPs were obtained and reviewed to evaluate the vascular anatomy. RADIATION DOSE REDUCTION: This exam was performed according to the departmental dose-optimization program which includes automated exposure control, adjustment of the mA and/or kV according to patient size and/or use of iterative reconstruction technique. CONTRAST:   OMNIPAQUE IOHEXOL 350 MG/ML SOLN COMPARISON:  CT abdomen pelvis, 09/14/2021 FINDINGS: CTA CHEST FINDINGS VASCULAR Aorta: Satisfactory opacification of the aorta. 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 No evidence of aneurysm, dissection, or other acute aortic pathology. Cardiovascular: No evidence of pulmonary embolism on limited non-tailored examination. Mild cardiomegaly. Scattered left and right coronary artery calcifications no pericardial effusion. Review of the MIP images confirms the above findings. NON VASCULAR Mediastinum/Nodes: No enlarged mediastinal, hilar, or axillary lymph nodes. Thyroid gland, trachea, and esophagus demonstrate no significant findings. Lungs/Pleura: Diffuse bilateral bronchial wall thickening. Mild bibasilar scarring including a thin walled pneumatocele of the left lung base (series 7,  image 118). No pleural effusion or pneumothorax. Incidental note of a prominent azygous fissure. Musculoskeletal: No chest wall abnormality. No acute osseous findings. Review of the MIP images confirms the above findings. CTA ABDOMEN AND PELVIS FINDINGS VASCULAR Normal contour and caliber of the abdominal aorta. No evidence of aneurysm, dissection, or other acute aortic pathology. Standard branching pattern of the abdominal aorta with solitary bilateral renal arteries. Scattered aortic atherosclerosis. Review of the MIP images confirms the above findings. NON-VASCULAR Hepatobiliary: No solid liver abnormality is seen. No gallstones, gallbladder wall thickening, or biliary dilatation. Pancreas: Unremarkable. No pancreatic ductal dilatation or surrounding inflammatory changes. Spleen: Normal in size without significant abnormality. Adrenals/Urinary Tract: Adrenal glands are unremarkable. Kidneys are normal, without renal calculi, solid lesion, or hydronephrosis. Bladder is unremarkable. Stomach/Bowel: Stomach is within normal limits. Appendix appears  normal. No evidence of bowel wall thickening, distention, or inflammatory changes. Sigmoid diverticulosis. Lymphatic: Status post hysterectomy. Reproductive: No mass or other significant abnormality. Other: No abdominal wall hernia or abnormality. No ascites. Musculoskeletal: No acute osseous findings. IMPRESSION: 1. No evidence of aortic aneurysm, dissection, or other acute aortic pathology. 2. 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 Recommend annual imaging followup by CTA or MRA. This recommendation follows 2010 ACCF/AHA/AATS/ACR/ASA/SCA/SCAI/SIR/STS/SVM Guidelines for the Diagnosis and Management of Patients with Thoracic Aortic Disease. Circulation. 2010; 121: Z610-R604. Aortic aneurysm NOS (ICD10-I71.9) 3. Diffuse bilateral bronchial wall thickening, consistent with nonspecific infectious or inflammatory bronchitis. 4. Mild cardiomegaly and coronary artery disease. 5. Sigmoid diverticulosis without evidence of acute diverticulitis. Aortic Atherosclerosis (ICD10-I70.0). Electronically Signed   By: Jearld Lesch M.D.   On: 05/31/2023 16:46   DG Chest 1 View  Result Date: 05/31/2023 CLINICAL DATA:  Chest pain and left arm pain for 2 days. EXAM: CHEST  1 VIEW COMPARISON:  Chest radiograph dated 07/11/2022. FINDINGS: The heart size and mediastinal contours are within normal limits. Both lungs are clear. Degenerative changes are seen in the spine. IMPRESSION: No active disease. Electronically Signed   By: Romona Curls M.D.   On: 05/31/2023 13:13    Procedures Procedures    Medications Ordered in ED Medications  HYDROcodone-acetaminophen (NORCO/VICODIN) 5-325 MG per tablet 2 tablet (2 tablets Oral Given 05/31/23 1608)  iohexol (OMNIPAQUE) 350 MG/ML injection 100 mL (100 mLs Intravenous Contrast Given 05/31/23 1635)  ibuprofen (ADVIL) tablet 400 mg (400 mg Oral Given 05/31/23 1801)    ED Course/ Medical Decision Making/ A&P Clinical Course as of  05/31/23 2314  Sun May 31, 2023  1517 ED EKG Sinus rhythm.  Rate of 57.  Normal intervals.  No axis deviation.  No ST segment changes.  Nonischemic ECG.  When compared to prior ECG from June 2021, previous nonspecific T wave abnormalities in the anterior and lateral leads have resolved. [JR]  1518 DG Chest 1 View Independently interpreted the patient's chest x-ray.  No cardiomegaly, mediastinal widening, pneumothorax, or focal opacities appreciated on my review. [JR]  1720 CT Angio Chest/Abd/Pel for Dissection W and/or Wo Contrast I independently reviewed this image.  No aortic dissection noted. [JR]    Clinical Course User Index [JR] Rolla Flatten, MD                                 Medical Decision Making Amount and/or Complexity of Data Reviewed Labs: ordered. Radiology: ordered and independent interpretation performed. Decision-making details documented in ED Course. ECG/medicine tests:  ordered and independent interpretation performed. Decision-making details documented in ED Course.  Risk Prescription drug management.   67 year old female presents here for left-sided chest pain.  Noted to be bradycardic on presentation.  Vitals otherwise reassuring.  On exam, patient is uncomfortable appearing.  She is clutching her left upper extremity with the right hand.  She winces anytime she needs to move her left upper extremity.  Cardiopulmonary exam unremarkable.  She does have left anterior chest wall tenderness as well as left thoracic back tenderness.  She has normal sensation in bilateral upper and lower extremities.  However, left DP pulse is not overly palpated when compared to right.  Bilateral radial pulses intact.  Differential diagnosis includes ACS, PE, aortic dissection, pneumothorax, electrode abnormality, costochondritis.  Will treat patient's pain with Norco 5-325 mg x 2 as she does not currently have IV access.  Troponin already resulted at the time of my initial evaluation,  which is within normal limits.  I independently reviewed the patient's labs.  All other laboratory workup reassuring without abnormalities.  However, repeat troponin pending.  I independently reviewed the patient's ECG, which is overall nonischemic.  I also reviewed her chest x-ray, which does not demonstrate any acute abnormalities.  However, I am primarily concerned for an aortic dissection, given her chest pain, back pain, and left upper extremity symptoms.  Will obtain CTA chest/abdomen/pelvis for further evaluation.  I independent interpreted this patient's CTA.  No evidence of dissection. I suspect her pain in her left arm is likely musculoskeletal as it is reproducible with palpation and provoked with movement.  Will give ibuprofen here for anti-inflammatory properties.  Patient has not had a cardiac evaluation since stress testing in 2013.  She would benefit from referral to cardiology, which I will place today.  Patient also had incidental finding on CT of slightly enlarged thoracic aorta, which was communicated to the patient.  She was instructed to follow-up on this with her PCP.  Considered admission to the hospital.  However, I feel the patient is appropriate for outpatient management and PCP follow-up.  Return precautions were discussed with the patient at the time of discharge.  She was discharged in stable condition.  Patient's presentation is most consistent with acute presentation with potential threat to life or bodily function.         Final Clinical Impression(s) / ED Diagnoses Final diagnoses:  Chest pain, atypical  Left arm pain    Rx / DC Orders ED Discharge Orders          Ordered    Ambulatory referral to Cardiology       Comments: If you have not heard from the Cardiology office within the next 72 hours please call [phone].   05/31/23 1733              Rolla Flatten, MD 05/31/23 2315    Virgina Norfolk, DO 05/31/23 2319

## 2023-05-31 NOTE — ED Notes (Signed)
 CCMD contacted for cardiac monitoring.

## 2023-05-31 NOTE — ED Provider Triage Note (Signed)
Emergency Medicine Provider Triage Evaluation Note  Michele Gross , a 67 y.o. female  was evaluated in triage.  Pt complains of constant pressure ("ton of bricks") in chest that radiates into left arm that started on Friday. Took hydrocodone at home which did not help  Review of Systems  Positive: Chest pressure, arm pain Negative: Fever, resp symptoms, sob  Physical Exam  BP (!) 135/90 (BP Location: Right Arm)   Pulse (!) 59   Temp 98.4 F (36.9 C) (Oral)   Resp 18   Ht 5' 3.5" (1.613 m)   Wt 79.4 kg   SpO2 99%   BMI 30.51 kg/m  Gen:   Awake, no distress   Resp:  Normal effort  MSK:   Moves extremities without difficulty  Other:    Medical Decision Making  Medically screening exam initiated at 12:31 PM.  Appropriate orders placed.  Michele Gross was informed that the remainder of the evaluation will be completed by another provider, this initial triage assessment does not replace that evaluation, and the importance of remaining in the ED until their evaluation is complete.  Labs, EKG ordered   Judithann Sheen, [place] 05/31/23 1233

## 2023-05-31 NOTE — Discharge Instructions (Addendum)
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 Recommend annual imaging followup by CTA  or MRA.    You should follow-up on these findings with your primary care doctor for repeat imaging as described above.  You were referred to cardiology today.  If they have not called you in the next 2-3 business days, please call their clinic to schedule an appointment.  Return to the emergency department if you develop chest pain, shortness of breath, severe worsening of your left arm pain, discoloration of your left arm, numbness of your left arm, or any symptoms you are worried about.

## 2023-05-31 NOTE — ED Triage Notes (Signed)
Pt c/o chest pain and left arm pain that started 2 days ago. Pt denies shortness of breath or vomiting, has had nausea.

## 2023-06-15 ENCOUNTER — Other Ambulatory Visit: Payer: Self-pay | Admitting: Student

## 2023-06-15 DIAGNOSIS — Z780 Asymptomatic menopausal state: Secondary | ICD-10-CM

## 2023-08-27 ENCOUNTER — Encounter: Payer: Self-pay | Admitting: Cardiology

## 2023-08-27 ENCOUNTER — Ambulatory Visit: Payer: Medicare HMO | Attending: Cardiology | Admitting: Cardiology

## 2023-08-27 VITALS — BP 120/70 | HR 81 | Resp 15 | Ht 63.0 in | Wt 200.0 lb

## 2023-08-27 DIAGNOSIS — R072 Precordial pain: Secondary | ICD-10-CM | POA: Diagnosis not present

## 2023-08-27 DIAGNOSIS — I77810 Thoracic aortic ectasia: Secondary | ICD-10-CM

## 2023-08-27 DIAGNOSIS — I10 Essential (primary) hypertension: Secondary | ICD-10-CM | POA: Diagnosis not present

## 2023-08-27 DIAGNOSIS — E782 Mixed hyperlipidemia: Secondary | ICD-10-CM | POA: Diagnosis not present

## 2023-08-27 DIAGNOSIS — I2510 Atherosclerotic heart disease of native coronary artery without angina pectoris: Secondary | ICD-10-CM

## 2023-08-27 NOTE — Patient Instructions (Addendum)
 Medication Instructions:  Your physician recommends that you continue on your current medications as directed. Please refer to the Current Medication list given to you today.  **1 week prior to your coronary CTA, take Metoprolol 25 mg twice daily (1 tablet). After the scan is complete, go back to taking 12.5 mg twice daily  *If you need a refill on your cardiac medications before your next appointment, please call your pharmacy*  Lab Work: To be completed today: BMP   If you have labs (blood work) drawn today and your tests are completely normal, you will receive your results only by: MyChart Message (if you have MyChart) OR A paper copy in the mail If you have any lab test that is abnormal or we need to change your treatment, we will call you to review the results.  Testing/Procedures: Your physician has requested that you have an echocardiogram. Echocardiography is a painless test that uses sound waves to create images of your heart. It provides your doctor with information about the size and shape of your heart and how well your heart's chambers and valves are working. This procedure takes approximately one hour. There are no restrictions for this procedure. Please do NOT wear cologne, perfume, aftershave, or lotions (deodorant is allowed). Please arrive 15 minutes prior to your appointment time.  Please note: We ask at that you not bring children with you during ultrasound (echo/ vascular) testing. Due to room size and safety concerns, children are not allowed in the ultrasound rooms during exams. Our front office staff cannot provide observation of children in our lobby area while testing is being conducted. An adult accompanying a patient to their appointment will only be allowed in the ultrasound room at the discretion of the ultrasound technician under special circumstances. We apologize for any inconvenience.     Your cardiac CT will be scheduled at:   [facility] [address] [place], [place] [address] [phone]  Please arrive at the [facility] and Children's Entrance (Entrance C2) of [facility] 30 minutes prior to test start time. You can use the FREE valet parking offered at entrance C (encouraged to control the heart rate for the test)  Proceed to the [facility] Radiology Department (first floor) to check-in and test prep.  All radiology patients and guests should use entrance C2 at [facility], accessed from [facility], even though the hospital's physical address listed is [address].     Please follow these instructions carefully (unless otherwise directed):  An IV will be required for this test and Nitroglycerin will be given.  Hold all erectile dysfunction medications at least 3 days (72 hrs) prior to test. (Ie viagra, cialis, sildenafil, tadalafil, etc)   On the Night Before the Test: Be sure to Drink plenty of water. Do not consume any caffeinated/decaffeinated beverages or chocolate 12 hours prior to your test. Do not take any antihistamines 12 hours prior to your test.  On the Day of the Test: Drink plenty of water until 1 hour prior to the test. Do not eat any food 1 hour prior to test. You may take your regular medications prior to the test.  Take metoprolol (Lopressor) two hours prior to test and as instructed by Dr Odis Hollingshead.  If you take Furosemide/Hydrochlorothiazide/Spironolactone/Chlorthalidone, please HOLD on the morning of the test. Patients who wear a continuous glucose monitor MUST remove the device prior to scanning. FEMALES- please wear underwire-free bra if available, avoid dresses &  tight clothing  After the Test: Drink plenty of water. After receiving IV contrast, you may experience a mild flushed feeling. This is normal. On occasion, you may experience a mild rash up to 24 hours after the test. This is not dangerous. If this occurs, you can take Benadryl 25 mg, Zyrtec,  Claritin, or Allegra and increase your fluid intake. (Patients taking Tikosyn should avoid Benadryl, and may take Zyrtec, Claritin, or Allegra) If you experience trouble breathing, this can be serious. If it is severe call 911 IMMEDIATELY. If it is mild, please call our office.  We will call to schedule your test 2-4 weeks out understanding that some insurance companies will need an authorization prior to the service being performed.   For more information and frequently asked questions, please visit our website : [email or web address]  For non-scheduling related questions, please contact the cardiac imaging nurse navigator should you have any questions/concerns: Cardiac Imaging Nurse Navigators Direct Office Dial: [phone]   For scheduling needs, including cancellations and rescheduling, please call [place], [phone].  Follow-Up: At [facility], you and your health needs are our priority.  As part of our continuing mission to provide you with exceptional heart care, we have created designated Provider Care Teams.  These Care Teams include your primary Cardiologist (physician) and Advanced Practice Providers (APPs -  Physician Assistants and Nurse Practitioners) who all work together to provide you with the care you need, when you need it.  We recommend signing up for the patient portal called "MyChart".  Sign up information is provided on this After Visit Summary.  MyChart is used to connect with patients for Virtual Visits (Telemedicine).  Patients are able to view lab/test results, encounter notes, upcoming appointments, etc.  Non-urgent messages can be sent to your provider as well.   To learn more about what you can do with MyChart, go to [email or web address].    Your next appointment:   6 month(s)  The format for your next appointment:   In Person  Provider:   Tessa Lerner, DO {

## 2023-08-27 NOTE — Progress Notes (Signed)
 Cardiology Office Note:    Date:  08/29/2023  NAME:  Michele Gross    MRN: [id number] DOB:  [birth year]   PCP:  Default, Provider, MD  Former Cardiology Providers: NA Primary Cardiologist:  Tessa Lerner, DO, [facility] (established care 08/27/2023) Electrophysiologist:  None   Referring MD: Virgina Norfolk, DO  Reason of Consult: Chest Pain  Chief Complaint  Patient presents with   Chest Pain   New Patient (Initial Visit)    History of Present Illness:    Michele Gross is a 68 y.o. 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 She is being seen today for the evaluation of chest pain at the request of Virgina Norfolk, DO.  She has experienced intermittent chest pain throughout her life, but recently they have been more frequent.  Over the past month, the frequency of the pain has increased, prompting a hospital visit in December. At that time, she was informed of having a dilated aorta and was later discharged.  The chest pain is sharp and heavy, located in the middle of the chest, with a severity of 9 out of 10. The pain is exacerbated by exertion and relieved by rest.   She is currently taking metoprolol, half a tablet twice a day, and aspirin, and reports feeling well on these medications without recent issues. She also takes cholesterol medication, which she has been on for two to three years.  In terms of physical activity, she walks a mile every other day and feels great during these walks, without experiencing chest pain. She has started drinking more water to address cramps she attributes to dehydration.  She does not smoke or drink and has a history of marijuana use but never smoked cigarettes.   Current Medications: Current Meds  Medication Sig   albuterol (VENTOLIN HFA) 108 (90 Base) MCG/ACT  inhaler Inhale 2 puffs into the lungs every 4 (four) hours as needed for wheezing or shortness of breath.   aspirin EC 81 MG EC tablet Take 1 tablet (81 mg total) by mouth daily.   atorvastatin (LIPITOR) 10 MG tablet Take 10 mg by mouth daily.   dicyclomine (BENTYL) 20 MG tablet Take 1 tablet (20 mg total) by mouth 2 (two) times daily.   HYDROcodone-acetaminophen (NORCO/VICODIN) 5-325 MG tablet Take 1 tablet by mouth every 6 (six) hours as needed for moderate pain.   metoprolol tartrate (LOPRESSOR) 25 MG tablet Take 0.5 tablets (12.5 mg total) by mouth 2 (two) times daily. TAKE 1/2 TABLET(12.5 MG) BY MOUTH TWICE DAILY   OZEMPIC, 1 MG/DOSE, 4 MG/3ML SOPN Inject into the skin.   topiramate (TOPAMAX) 100 MG tablet Take by mouth.     Allergies:    Percocet [oxycodone-acetaminophen]   Past Medical History: Past Medical History:  Diagnosis Date   Chest pain    Negative nuclear stress 2007, negative stress echo 2011, reported negative cath 2000; negative stress echo November 2013   Chronic shoulder pain    DJD (degenerative joint disease)    GERD (gastroesophageal reflux disease)    Hyperlipidemia    Hypertension    Obesity    Sleep apnea     Past Surgical History: Past Surgical History:  Procedure Laterality Date   PARTIAL HYSTERECTOMY      Social History: Social History   Tobacco Use   Smoking status: Never   Smokeless  tobacco: Never  Vaping Use   Vaping status: Never Used  Substance Use Topics   Alcohol use: Not Currently    Alcohol/week: 2.0 standard drinks of alcohol    Types: 2 Glasses of wine per week   Drug use: Yes    Types: Marijuana    Comment: occasionally    Family History: Family History  Problem Relation Age of Onset   Cancer Father        colon and later "throat;" in remission   Colon polyps Neg Hx    Colon cancer Neg Hx    Esophageal cancer Neg Hx    Stomach cancer Neg Hx    Rectal cancer Neg Hx     ROS:   Review of Systems  Cardiovascular:   Positive for chest pain. Negative for claudication, irregular heartbeat, leg swelling, near-syncope, orthopnea, palpitations, paroxysmal nocturnal dyspnea and syncope.  Respiratory:  Negative for shortness of breath.   Hematologic/Lymphatic: Negative for bleeding problem.    EKGs/Labs/Other Studies Reviewed:   EKG: EKG Interpretation Date/Time:  Thursday August 27 2023 10:04:09 EST Ventricular Rate:  71 PR Interval:  154 QRS Duration:  88 QT Interval:  400 QTC Calculation: 434 R Axis:   16  Text Interpretation: Normal sinus rhythm T wave abnormality, consider inferior ischemia When compared with ECG of 31-May-2023 11:55, T wave inversion more evident in Inferior leads Inverted T waves have replaced nonspecific T wave abnormality in Anterior leads Confirmed by Tessa Lerner [id number]) on 08/27/2023 10:10:14 AM  Echocardiogram: 04/2012: LVEF 50-55% grade 1 diastolic dysfunction see report for additional details   RADIOLOGY: CTA chest abdomen pelvis dissection May 31, 2023 1. No evidence of aortic aneurysm, dissection, or other acute aortic pathology. 2. 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 Recommend annual imaging followup by CTA or MRA. This recommendation follows 2010 ACCF/AHA/AATS/ACR/ASA/SCA/SCAI/SIR/STS/SVM Guidelines for the Diagnosis and Management of Patients with Thoracic Aortic Disease. Circulation. 2010; 121: M578-I696. Aortic aneurysm NOS (ICD10-I71.9) 3. Diffuse bilateral bronchial wall thickening, consistent with nonspecific infectious or inflammatory bronchitis. 4. Mild cardiomegaly and coronary artery disease. 5. Sigmoid diverticulosis without evidence of acute diverticulitis. Labs:    Latest Ref Rng & Units 05/31/2023   12:34 PM 03/11/2022    3:30 AM 09/14/2021    8:34 AM  CBC  WBC 4.0 - 10.5 K/uL 9.2  14.5  8.3   Hemoglobin 12.0 - 15.0 g/dL 29.5  28.4  13.2   Hematocrit 36.0 - 46.0 % 40.5  41.4  42.3    Platelets 150 - 400 K/uL 235  293  274        Latest Ref Rng & Units 05/31/2023   12:34 PM 03/11/2022    3:30 AM 09/14/2021    8:34 AM  BMP  Glucose 70 - 99 mg/dL 95  440  102   BUN 8 - 23 mg/dL 11  15  16    Creatinine 0.44 - 1.00 mg/dL 7.25  3.66  4.40   Sodium 135 - 145 mmol/L 139  143  142   Potassium 3.5 - 5.1 mmol/L 3.9  3.6  3.3   Chloride 98 - 111 mmol/L 107  113  114   CO2 22 - 32 mmol/L 25  21  25    Calcium 8.9 - 10.3 mg/dL 9.4  34.7  9.4       Latest Ref Rng & Units 05/31/2023   12:34 PM 03/11/2022    3:30 AM 09/14/2021  8:34 AM  CMP  Glucose 70 - 99 mg/dL 95  540  981   BUN 8 - 23 mg/dL 11  15  16    Creatinine 0.44 - 1.00 mg/dL 1.91  4.78  2.95   Sodium 135 - 145 mmol/L 139  143  142   Potassium 3.5 - 5.1 mmol/L 3.9  3.6  3.3   Chloride 98 - 111 mmol/L 107  113  114   CO2 22 - 32 mmol/L 25  21  25    Calcium 8.9 - 10.3 mg/dL 9.4  62.1  9.4   Total Protein 6.5 - 8.1 g/dL 6.8  7.5  7.4   Total Bilirubin <1.2 mg/dL 0.5  0.3  0.7   Alkaline Phos 38 - 126 U/L 60  68  74   AST 15 - 41 U/L 16  16  14    ALT 0 - 44 U/L 17  11  12      Lab Results  Component Value Date   CHOL 192 05/02/2012   HDL 66 05/02/2012   LDLCALC 105 (H) 05/02/2012   TRIG 105 05/02/2012   CHOLHDL 2.9 05/02/2012   No results for input(s): "LIPOA" in the last 8760 hours. No components found for: "NTPROBNP" No results for input(s): "PROBNP" in the last 8760 hours. No results for input(s): "TSH" in the last 8760 hours.  Physical Exam:    Today's Vitals   08/27/23 0959  BP: 120/70  Pulse: 81  Resp: 15  SpO2: 100%  Weight: 200 lb (90.7 kg)  Height: 5\' 3"  (1.6 m)   Body mass index is 35.43 kg/m. Wt Readings from Last 3 Encounters:  08/27/23 200 lb (90.7 kg)  05/31/23 175 lb (79.4 kg)  03/11/22 200 lb (90.7 kg)    Physical Exam  Constitutional: No distress.  hemodynamically stable  Neck: No JVD present.  Cardiovascular: Normal rate, regular rhythm, S1 normal and S2 normal. Exam  reveals no gallop, no S3 and no S4.  No murmur heard. Pulmonary/Chest: Effort normal and breath sounds normal. No stridor. She has no wheezes. She has no rales.  Musculoskeletal:        General: No edema.     Cervical back: Neck supple.  Skin: Skin is warm.     Impression & Recommendation(s):  Impression:   ICD-10-CM   1. Precordial pain  R07.2 EKG 12-Lead    ECHOCARDIOGRAM COMPLETE    Basic metabolic panel    Basic metabolic panel    CT CORONARY MORPH W/CTA COR W/SCORE W/CA W/CM &/OR WO/CM    2. Benign hypertension  I10     3. Mixed hyperlipidemia  E78.2     4. Ascending aorta dilation (HCC)  I77.810     5. Coronary artery calcification seen on computed tomography  I25.10        Recommendation(s):  Precordial pain Coronary artery calcification seen on computed tomography Symptom suggestive of cardiac discomfort. EKG notes more subtle ST-T changes inferior leads.  No STEMI criteria. Continue aspirin 81 mg p.o. daily Continue Lipitor 10 mg p.o. daily. Continue Lopressor 12.5 mg p.o. twice daily, recommending transitioning to Toprol-XL. Echo will be ordered to evaluate for structural heart disease and left ventricular systolic function. Coronary CTA to evaluate for obstructive CAD, plaque, and coronary calcium score A week prior to her coronary CTA have advised her to take Lopressor 25 mg p.o. twice daily She will need to have a repeat BMP. Her symptoms are better controlled with current medical therapy. Educated her  on seeking medical attention sooner by going to the closest ER via EMS if the symptoms increase in intensity, frequency, duration, or has typical chest pain as discussed in the office.  Patient verbalized understanding.  Benign hypertension Office and home blood pressure very well-controlled. Currently on metoprolol. If additional medical therapy is needed recommend losartan. Patient states that she keeps a log of her blood pressures and reviews it with PCP.   Recommend a goal SBP around 120 mmHg if able to tolerate given the dilated ascending aorta.  Mixed hyperlipidemia Currently on atorvastatin 10 mg p.o. daily.   She denies myalgia or other side effects. Cardiology is following peripherally.  Ascending aorta dilation (HCC) Diagnosed 05/31/2023 CTA chest abdomen pelvis.  Recommend annual follow-up imaging.  Orders Placed:  Orders Placed This Encounter  Procedures   CT CORONARY MORPH W/CTA COR W/SCORE W/CA W/CM &/OR WO/CM    Standing Status:   Future    Expected Date:   09/03/2023    Expiration Date:   08/26/2024    If indicated for the ordered procedure, I authorize the administration of contrast media per Radiology protocol:   Yes    Preferred Imaging Location?:   [facility]   Basic metabolic panel    Standing Status:   Future    Number of Occurrences:   1    Expected Date:   08/27/2023    Expiration Date:   08/26/2024   EKG 12-Lead   ECHOCARDIOGRAM COMPLETE    Standing Status:   Future    Expected Date:   09/03/2023    Expiration Date:   08/26/2024    Where should this test be performed:   [facility])    Does the patient weigh less than or greater than 250 lbs?:   Patient weighs less than 250 lbs    Perflutren DEFINITY (image enhancing agent) should be administered unless hypersensitivity or allergy exist:   Administer Perflutren    Reason for exam-Echo:   Chest Pain  R07.9    Final Medication List:   No orders of the defined types were placed in this encounter.   Medications Discontinued During This Encounter  Medication Reason   benzonatate (TESSALON) 100 MG capsule Patient Preference   allopurinol (ZYLOPRIM) 100 MG tablet Discontinued by provider     Current Outpatient Medications:    albuterol (VENTOLIN HFA) 108 (90 Base) MCG/ACT inhaler, Inhale 2 puffs into the lungs every 4 (four) hours as needed for wheezing or shortness of breath., Disp: 1 each, Rfl: 0   aspirin EC 81 MG EC tablet, Take 1  tablet (81 mg total) by mouth daily., Disp: 30 tablet, Rfl: 1   atorvastatin (LIPITOR) 10 MG tablet, Take 10 mg by mouth daily., Disp: , Rfl:    dicyclomine (BENTYL) 20 MG tablet, Take 1 tablet (20 mg total) by mouth 2 (two) times daily., Disp: 20 tablet, Rfl: 0   HYDROcodone-acetaminophen (NORCO/VICODIN) 5-325 MG tablet, Take 1 tablet by mouth every 6 (six) hours as needed for moderate pain., Disp: , Rfl:    metoprolol tartrate (LOPRESSOR) 25 MG tablet, Take 0.5 tablets (12.5 mg total) by mouth 2 (two) times daily. TAKE 1/2 TABLET(12.5 MG) BY MOUTH TWICE DAILY, Disp: 30 tablet, Rfl: 0   OZEMPIC, 1 MG/DOSE, 4 MG/3ML SOPN, Inject into the skin., Disp: , Rfl:    topiramate (TOPAMAX) 100 MG tablet, Take by mouth., Disp: , Rfl:    Disposition:   Return in about 6 months (around  02/27/2024) for Reevaluation of, Chest pain, Review, CCTA.   Her questions and concerns were addressed to her satisfaction. She voices understanding of the recommendations provided during this encounter.    Signed, Tessa Lerner, DO, [facility]  [facility]  [address] [place], [place] [address] 08/29/2023 9:24 PM

## 2023-09-17 ENCOUNTER — Ambulatory Visit (HOSPITAL_COMMUNITY)

## 2023-09-18 ENCOUNTER — Telehealth (HOSPITAL_COMMUNITY): Payer: Self-pay | Admitting: *Deleted

## 2023-09-18 NOTE — Telephone Encounter (Signed)
 Reaching out to patient to offer assistance regarding upcoming cardiac imaging study; pt verbalizes understanding of appt date/time, parking situation and where to check in, pre-test NPO status and medications ordered, and verified current allergies; name and call back number provided for further questions should they arise Johney Frame RN Navigator Cardiac Imaging [facility] [phone] office [phone] cell

## 2023-09-21 ENCOUNTER — Ambulatory Visit (HOSPITAL_COMMUNITY)
Admission: RE | Admit: 2023-09-21 | Discharge: 2023-09-21 | Disposition: A | Discharge status: Home / Self Care | Source: Ambulatory Visit | Attending: Cardiology | Admitting: Cardiology

## 2023-09-21 DIAGNOSIS — R072 Precordial pain: Secondary | ICD-10-CM

## 2023-09-21 MED ORDER — NITROGLYCERIN 0.4 MG SL SUBL
0.8000 mg | SUBLINGUAL_TABLET | Freq: Once | SUBLINGUAL | Status: AC
  Administered 2023-09-21: 0.8 mg via SUBLINGUAL

## 2023-09-21 MED ORDER — IOHEXOL 350 MG/ML SOLN
95.0000 mL | Freq: Once | INTRAVENOUS | Status: AC | PRN
Start: 2023-09-21 — End: 2023-09-21
  Administered 2023-09-21: 95 mL via INTRAVENOUS

## 2023-09-21 MED ORDER — NITROGLYCERIN 0.4 MG SL SUBL
SUBLINGUAL_TABLET | SUBLINGUAL | Status: AC
  Filled 2023-09-21: qty 2

## 2023-09-22 ENCOUNTER — Other Ambulatory Visit: Payer: Self-pay

## 2023-09-22 DIAGNOSIS — I77819 Aortic ectasia, unspecified site: Secondary | ICD-10-CM

## 2023-10-14 ENCOUNTER — Ambulatory Visit (HOSPITAL_COMMUNITY): Attending: Cardiovascular Disease

## 2023-10-14 DIAGNOSIS — R072 Precordial pain: Secondary | ICD-10-CM | POA: Insufficient documentation

## 2023-10-14 LAB — ECHOCARDIOGRAM COMPLETE
Area-P 1/2: 2.42 cm2
S' Lateral: 3.9 cm

## 2024-01-18 ENCOUNTER — Encounter: Payer: Self-pay | Admitting: Podiatry

## 2024-01-18 ENCOUNTER — Ambulatory Visit (INDEPENDENT_AMBULATORY_CARE_PROVIDER_SITE_OTHER): Admitting: Podiatry

## 2024-01-18 ENCOUNTER — Ambulatory Visit (INDEPENDENT_AMBULATORY_CARE_PROVIDER_SITE_OTHER)

## 2024-01-18 DIAGNOSIS — M7752 Other enthesopathy of left foot: Secondary | ICD-10-CM

## 2024-01-18 DIAGNOSIS — M65872 Other synovitis and tenosynovitis, left ankle and foot: Secondary | ICD-10-CM

## 2024-01-18 DIAGNOSIS — M7750 Other enthesopathy of unspecified foot: Secondary | ICD-10-CM | POA: Diagnosis not present

## 2024-01-18 DIAGNOSIS — M2042 Other hammer toe(s) (acquired), left foot: Secondary | ICD-10-CM | POA: Diagnosis not present

## 2024-01-18 MED ORDER — TRIAMCINOLONE ACETONIDE 10 MG/ML IJ SUSP
10.0000 mg | Freq: Once | INTRAMUSCULAR | Status: AC
Start: 2024-01-18 — End: 2024-01-18
  Administered 2024-01-18: 10 mg via INTRA_ARTICULAR

## 2024-01-20 NOTE — Progress Notes (Signed)
 Subjective:   Patient ID: Michele Gross, female   DOB: 68 y.o.   MRN: [id number]   HPI Patient presents with a very painful lesion fifth digit left foot that is been present for a long time but has gotten worse over the last 6 months.  States it is inflamed with fluid buildup and making shoe gear difficult and she has tried wider shoes she has tried to trim it without relief of symptoms.  Patient does not smoke likes to be active   Review of Systems  All other systems reviewed and are negative.       Objective:  Physical Exam Vitals and nursing note reviewed.  Constitutional:      Appearance: She is well-developed.  Pulmonary:     Effort: Pulmonary effort is normal.  Musculoskeletal:        General: Normal range of motion.  Skin:    General: Skin is warm.  Neurological:     Mental Status: She is alert.     Neurovascular status intact muscle strength found to be adequate range of motion within normal limits with patient found to have rotated fifth digit left with fluid buildup in the synovium around the digit with thick large keratotic lesion formation and rotation of the toe.  Patient is found to have good digital perfusion well oriented x 3     Assessment:  Inflammatory synovitis of the fifth digit left inner phalangeal joint with lesion very painful when pressed     Plan:  H&P reviewed condition and x-ray at great length.  Did discuss surgical intervention with derotational arthroplasty removal of the lesion with bone resection.  At this point organ to try to treat the inflammatory condition and that may be necessary someday and today I did sterile prep injected the inner phalangeal joint 2 mg dexamethasone  Kenalog  5 mg Xylocaine  directly into the synovium and did deep debridement of lesion no iatrogenic bleeding applied cushioning advised on wider shoes and reappoint as symptoms indicate  X-rays indicate rotation of the digit with prominence of the fifth metatarsal head  left

## 2024-02-08 ENCOUNTER — Other Ambulatory Visit: Payer: Medicare PPO

## 2024-02-17 DIAGNOSIS — M65872 Other synovitis and tenosynovitis, left ankle and foot: Secondary | ICD-10-CM | POA: Insufficient documentation

## 2024-03-14 ENCOUNTER — Ambulatory Visit (HOSPITAL_BASED_OUTPATIENT_CLINIC_OR_DEPARTMENT_OTHER)
Admission: RE | Admit: 2024-03-14 | Discharge: 2024-03-14 | Disposition: A | Discharge status: Home / Self Care | Source: Ambulatory Visit | Attending: Student | Admitting: Student

## 2024-03-14 DIAGNOSIS — Z780 Asymptomatic menopausal state: Secondary | ICD-10-CM | POA: Diagnosis present

## 2024-06-18 ENCOUNTER — Encounter (HOSPITAL_COMMUNITY): Payer: Self-pay

## 2024-06-18 ENCOUNTER — Ambulatory Visit (HOSPITAL_COMMUNITY)
Admission: EM | Admit: 2024-06-18 | Discharge: 2024-06-18 | Disposition: A | Discharge status: Home / Self Care | Attending: Emergency Medicine | Admitting: Emergency Medicine

## 2024-06-18 DIAGNOSIS — N3000 Acute cystitis without hematuria: Secondary | ICD-10-CM | POA: Diagnosis present

## 2024-06-18 DIAGNOSIS — R300 Dysuria: Secondary | ICD-10-CM | POA: Diagnosis present

## 2024-06-18 LAB — POCT URINE DIPSTICK
Bilirubin, UA: NEGATIVE
Blood, UA: NEGATIVE
Glucose, UA: NEGATIVE mg/dL
Ketones, POC UA: NEGATIVE mg/dL
Leukocytes, UA: NEGATIVE
Nitrite, UA: NEGATIVE
POC PROTEIN,UA: NEGATIVE
Spec Grav, UA: 1.025
Urobilinogen, UA: 0.2 U/dL
pH, UA: 5

## 2024-06-18 MED ORDER — CEPHALEXIN 500 MG PO CAPS: 500.0000 mg | ORAL_CAPSULE | Freq: Two times a day (BID) | ORAL | 0 refills | Status: AC

## 2024-06-18 NOTE — Discharge Instructions (Addendum)
 Start taking cephalexin  twice daily for 7 days for urinary tract infection coverage. Your urine culture will return over the next few days and someone will call if results require any adjustment in treatment. If you develop worsening/constant abdominal pain, large amounts of blood in your urine, severe back pain, fever, or severe weakness please seek immediate medical treatment in the emergency department.

## 2024-06-18 NOTE — ED Triage Notes (Signed)
 Lower back pain and dysuria x 2 days. Denies any nausea,fever or diarrhea.

## 2024-06-18 NOTE — ED Provider Notes (Signed)
 " [facility]    CSN: [id number] Arrival date & time: 06/18/24  0830      History   Chief Complaint Chief Complaint  Patient presents with   Urinary Tract Infection    HPI Michele Gross is a 68 y.o. female.   Patient presents with dysuria, bladder pressure, lower abdominal pain when urinating, and bilateral low back pain that began 2 days ago.  Patient is concerned that she may have a urinary tract infection as the symptoms are similar to when she has had 1 in the past.  Patient denies hematuria, fever, body aches, chills, and weakness.  Patient also denies any nausea, vomiting, and diarrhea.  The history is provided by the patient and medical records.  Urinary Tract Infection   Past Medical History:  Diagnosis Date   Chest pain    Negative nuclear stress 2007, negative stress echo 2011, reported negative cath 2000; negative stress echo November 2013   Chronic shoulder pain    DJD (degenerative joint disease)    GERD (gastroesophageal reflux disease)    Hyperlipidemia    Hypertension    Obesity    Sleep apnea     Patient Active Problem List   Diagnosis Date Noted   Other synovitis and tenosynovitis, left ankle and foot 02/17/2024   Essential hypertension, malignant 08/27/2023   Cervical radiculopathy due to degenerative joint disease of spine 05/02/2012   Chest pain, atypical 05/02/2012   HYPERLIPIDEMIA 07/01/2007   GERD 07/01/2007   BACK PAIN 07/01/2007    Past Surgical History:  Procedure Laterality Date   PARTIAL HYSTERECTOMY      OB History   No obstetric history on file.      Home Medications    Prior to Admission medications  Medication Sig Start Date End Date Taking? Authorizing Provider  albuterol  (VENTOLIN  HFA) 108 (90 Base) MCG/ACT inhaler Inhale 2 puffs into the lungs every 4 (four) hours as needed for wheezing or shortness of breath. 07/11/22  Yes Vonna Sharlet POUR, MD  aspirin  EC 81 MG EC tablet Take 1 tablet (81 mg total) by  mouth daily. 05/03/12  Yes Dianna Pao, MD  atorvastatin (LIPITOR) 10 MG tablet Take 10 mg by mouth daily. 08/26/21  Yes [provider]  cephALEXin  (KEFLEX ) 500 MG capsule Take 1 capsule (500 mg total) by mouth 2 (two) times daily for 7 days. 06/18/24 06/25/24 Yes Mackenzie Lia A, NP  dicyclomine  (BENTYL ) 20 MG tablet Take 1 tablet (20 mg total) by mouth 2 (two) times daily. 03/11/22  Yes Myrna Camelia HERO, NP  HYDROcodone -acetaminophen  (NORCO/VICODIN) 5-325 MG tablet Take 1 tablet by mouth every 6 (six) hours as needed for moderate pain.   Yes [provider]  OZEMPIC, 1 MG/DOSE, 4 MG/3ML SOPN Inject into the skin. 10/28/21  Yes [provider]  topiramate (TOPAMAX) 100 MG tablet Take by mouth. 07/03/22  Yes [provider]  metoprolol  tartrate (LOPRESSOR ) 25 MG tablet Take 0.5 tablets (12.5 mg total) by mouth 2 (two) times daily. TAKE 1/2 TABLET(12.5 MG) BY MOUTH TWICE DAILY 01/11/20 01/18/24  Tellis Laneta NOVAK, NP    Family History Family History  Problem Relation Age of Onset   Cancer Father        colon and later throat; in remission   Colon polyps Neg Hx    Colon cancer Neg Hx    Esophageal cancer Neg Hx    Stomach cancer Neg Hx    Rectal cancer Neg Hx  Social History Social History[1]   Allergies   Percocet [oxycodone -acetaminophen ]   Review of Systems Review of Systems  Per HPI  Physical Exam Triage Vital Signs ED Triage Vitals [06/18/24 0932]  Encounter Vitals Group     BP 115/71     Girls Systolic BP Percentile      Girls Diastolic BP Percentile      Boys Systolic BP Percentile      Boys Diastolic BP Percentile      Pulse Rate 74     Resp 18     Temp 98.3 F (36.8 C)     Temp Source Oral     SpO2 97 %     Weight      Height      Head Circumference      Peak Flow      Pain Score      Pain Loc      Pain Education      Exclude from Growth Chart    No data found.  Updated Vital Signs BP 115/71 (BP Location: Left  Arm)   Pulse 74   Temp 98.3 F (36.8 C) (Oral)   Resp 18   SpO2 97%   Visual Acuity Right Eye Distance:   Left Eye Distance:   Bilateral Distance:    Right Eye Near:   Left Eye Near:    Bilateral Near:     Physical Exam Vitals and nursing note reviewed.  Constitutional:      General: She is awake. She is not in acute distress.    Appearance: Normal appearance. She is well-developed and well-groomed. She is not ill-appearing.  Abdominal:     General: Abdomen is flat. Bowel sounds are normal.     Palpations: Abdomen is soft.     Tenderness: There is abdominal tenderness in the suprapubic area. There is no right CVA tenderness, left CVA tenderness, guarding or rebound.     Comments: Mild tenderness noted to suprapubic region.  Skin:    General: Skin is warm and dry.  Neurological:     Mental Status: She is alert.  Psychiatric:        Behavior: Behavior is cooperative.      UC Treatments / Results  Labs (all labs ordered are listed, but only abnormal results are displayed) Labs Reviewed  URINE CULTURE  POCT URINE DIPSTICK    EKG   Radiology No results found.  Procedures Procedures (including critical care time)  Medications Ordered in UC Medications - No data to display  Initial Impression / Assessment and Plan / UC Course  I have reviewed the triage vital signs and the nursing notes.  Pertinent labs & imaging results that were available during my care of the patient were reviewed by me and considered in my medical decision making (see chart for details).     Patient is overall well-appearing.  Vitals are stable.  Urinalysis unremarkable, will send urine culture.  Empirically treating with cephalexin  for urinary tract infection based on description of symptoms.  Discussed follow-up, return, and strict ER precautions. Final Clinical Impressions(s) / UC Diagnoses   Final diagnoses:  Acute cystitis without hematuria  Dysuria     Discharge Instructions       Start taking cephalexin  twice daily for 7 days for urinary tract infection coverage. Your urine culture will return over the next few days and someone will call if results require any adjustment in treatment. If you develop worsening/constant abdominal pain, large amounts of blood  in your urine, severe back pain, fever, or severe weakness please seek immediate medical treatment in the emergency department.    ED Prescriptions     Medication Sig Dispense Auth. Provider   cephALEXin  (KEFLEX ) 500 MG capsule Take 1 capsule (500 mg total) by mouth 2 (two) times daily for 7 days. 14 capsule Johnie Flaming A, NP      PDMP not reviewed this encounter.    [1]  Social History Tobacco Use   Smoking status: Never   Smokeless tobacco: Never  Vaping Use   Vaping status: Never Used  Substance Use Topics   Alcohol use: Not Currently    Alcohol/week: 2.0 standard drinks of alcohol    Types: 2 Glasses of wine per week   Drug use: Yes    Types: Marijuana    Comment: occasionally     Johnie Flaming LABOR, NP 06/18/24 1008  "

## 2024-06-20 ENCOUNTER — Ambulatory Visit (HOSPITAL_COMMUNITY): Payer: Self-pay

## 2024-06-20 LAB — URINE CULTURE: Culture: >=100000 — AB

## 2024-06-22 MED ORDER — FLUCONAZOLE 150 MG PO TABS: 150.0000 mg | ORAL_TABLET | Freq: Once | ORAL | 0 refills | Status: AC

## 2024-07-12 ENCOUNTER — Ambulatory Visit: Attending: Cardiology | Admitting: Cardiology

## 2024-08-22 ENCOUNTER — Ambulatory Visit (HOSPITAL_COMMUNITY)
